# Patient Record
Sex: Male | Born: 1955 | Race: White | Hispanic: No | Marital: Married | Smoking: Former smoker
Health system: Southern US, Community
[De-identification: ages and names within clinical notes are randomized; demographics above are authoritative.]

## PROBLEM LIST (undated history)

## (undated) DIAGNOSIS — D689 Coagulation defect, unspecified: Secondary | ICD-10-CM

## (undated) DIAGNOSIS — R0600 Dyspnea, unspecified: Secondary | ICD-10-CM

## (undated) DIAGNOSIS — F419 Anxiety disorder, unspecified: Secondary | ICD-10-CM

## (undated) DIAGNOSIS — K635 Polyp of colon: Secondary | ICD-10-CM

## (undated) DIAGNOSIS — G4730 Sleep apnea, unspecified: Secondary | ICD-10-CM

## (undated) DIAGNOSIS — E785 Hyperlipidemia, unspecified: Secondary | ICD-10-CM

## (undated) DIAGNOSIS — G4733 Obstructive sleep apnea (adult) (pediatric): Secondary | ICD-10-CM

## (undated) DIAGNOSIS — D649 Anemia, unspecified: Secondary | ICD-10-CM

## (undated) DIAGNOSIS — M1990 Unspecified osteoarthritis, unspecified site: Secondary | ICD-10-CM

## (undated) DIAGNOSIS — J301 Allergic rhinitis due to pollen: Secondary | ICD-10-CM

## (undated) DIAGNOSIS — Z8489 Family history of other specified conditions: Secondary | ICD-10-CM

## (undated) DIAGNOSIS — J189 Pneumonia, unspecified organism: Secondary | ICD-10-CM

## (undated) DIAGNOSIS — I82409 Acute embolism and thrombosis of unspecified deep veins of unspecified lower extremity: Secondary | ICD-10-CM

## (undated) DIAGNOSIS — F324 Major depressive disorder, single episode, in partial remission: Secondary | ICD-10-CM

## (undated) DIAGNOSIS — K219 Gastro-esophageal reflux disease without esophagitis: Secondary | ICD-10-CM

## (undated) DIAGNOSIS — M5412 Radiculopathy, cervical region: Secondary | ICD-10-CM

## (undated) HISTORY — DX: Acute embolism and thrombosis of unspecified deep veins of unspecified lower extremity: I82.409

## (undated) HISTORY — DX: Polyp of colon: K63.5

## (undated) HISTORY — DX: Obstructive sleep apnea (adult) (pediatric): G47.33

## (undated) HISTORY — PX: KNEE SURGERY: SHX244

## (undated) HISTORY — PX: ORCHIECTOMY: SHX2116

## (undated) HISTORY — DX: Allergic rhinitis due to pollen: J30.1

## (undated) HISTORY — PX: EYE SURGERY: SHX253

## (undated) HISTORY — DX: Anxiety disorder, unspecified: F41.9

## (undated) HISTORY — DX: Major depressive disorder, single episode, in partial remission: F32.4

## (undated) HISTORY — PX: POLYPECTOMY: SHX149

## (undated) HISTORY — PX: COLONOSCOPY: SHX174

## (undated) HISTORY — DX: Pneumonia, unspecified organism: J18.9

## (undated) HISTORY — PX: KNEE ARTHROSCOPY: SUR90

## (undated) HISTORY — PX: CYSTECTOMY: SUR359

## (undated) HISTORY — DX: Coagulation defect, unspecified: D68.9

## (undated) HISTORY — PX: CARPAL TUNNEL RELEASE: SHX101

## (undated) HISTORY — DX: Hyperlipidemia, unspecified: E78.5

---

## 1998-10-03 DIAGNOSIS — I82409 Acute embolism and thrombosis of unspecified deep veins of unspecified lower extremity: Secondary | ICD-10-CM

## 1998-10-03 DIAGNOSIS — I824Z1 Acute embolism and thrombosis of unspecified deep veins of right distal lower extremity: Secondary | ICD-10-CM

## 1998-10-03 HISTORY — DX: Acute embolism and thrombosis of unspecified deep veins of right distal lower extremity: I82.4Z1

## 1998-10-03 HISTORY — DX: Acute embolism and thrombosis of unspecified deep veins of unspecified lower extremity: I82.409

## 2000-01-13 ENCOUNTER — Encounter: Payer: Self-pay | Admitting: Orthopedic Surgery

## 2000-01-13 ENCOUNTER — Encounter: Admission: RE | Admit: 2000-01-13 | Discharge: 2000-01-13 | Payer: Self-pay | Admitting: Orthopedic Surgery

## 2000-02-01 ENCOUNTER — Inpatient Hospital Stay (HOSPITAL_COMMUNITY): Admission: RE | Admit: 2000-02-01 | Discharge: 2000-02-03 | Payer: Self-pay | Admitting: Orthopedic Surgery

## 2000-03-10 ENCOUNTER — Ambulatory Visit (HOSPITAL_COMMUNITY): Admission: RE | Admit: 2000-03-10 | Discharge: 2000-03-10 | Payer: Self-pay | Admitting: Orthopedic Surgery

## 2000-10-20 ENCOUNTER — Encounter: Payer: Self-pay | Admitting: Orthopaedic Surgery

## 2000-10-20 ENCOUNTER — Encounter: Admission: RE | Admit: 2000-10-20 | Discharge: 2000-10-20 | Payer: Self-pay | Admitting: Orthopaedic Surgery

## 2003-10-04 HISTORY — PX: EYE SURGERY: SHX253

## 2004-02-06 ENCOUNTER — Ambulatory Visit (HOSPITAL_BASED_OUTPATIENT_CLINIC_OR_DEPARTMENT_OTHER): Admission: RE | Admit: 2004-02-06 | Discharge: 2004-02-06 | Payer: Self-pay | Admitting: Orthopaedic Surgery

## 2004-02-06 ENCOUNTER — Ambulatory Visit (HOSPITAL_COMMUNITY): Admission: RE | Admit: 2004-02-06 | Discharge: 2004-02-06 | Payer: Self-pay | Admitting: Orthopaedic Surgery

## 2005-05-19 ENCOUNTER — Ambulatory Visit: Payer: Self-pay | Admitting: Family Medicine

## 2005-10-13 ENCOUNTER — Ambulatory Visit: Payer: Self-pay | Admitting: Family Medicine

## 2005-10-26 ENCOUNTER — Ambulatory Visit: Payer: Self-pay | Admitting: Family Medicine

## 2005-11-09 ENCOUNTER — Ambulatory Visit: Payer: Self-pay | Admitting: Family Medicine

## 2005-11-09 LAB — CONVERTED CEMR LAB: PSA: 0.72 ng/mL

## 2005-11-16 ENCOUNTER — Ambulatory Visit: Payer: Self-pay | Admitting: Family Medicine

## 2005-11-21 ENCOUNTER — Ambulatory Visit: Payer: Self-pay | Admitting: Family Medicine

## 2005-11-23 ENCOUNTER — Ambulatory Visit: Payer: Self-pay | Admitting: Family Medicine

## 2005-12-06 ENCOUNTER — Ambulatory Visit: Payer: Self-pay | Admitting: Family Medicine

## 2006-03-28 ENCOUNTER — Ambulatory Visit: Payer: Self-pay | Admitting: Internal Medicine

## 2006-04-27 ENCOUNTER — Ambulatory Visit: Payer: Self-pay | Admitting: Family Medicine

## 2006-06-07 ENCOUNTER — Ambulatory Visit: Payer: Self-pay | Admitting: Family Medicine

## 2007-03-15 ENCOUNTER — Encounter: Payer: Self-pay | Admitting: Family Medicine

## 2007-03-15 DIAGNOSIS — J309 Allergic rhinitis, unspecified: Secondary | ICD-10-CM | POA: Insufficient documentation

## 2007-03-15 DIAGNOSIS — IMO0002 Reserved for concepts with insufficient information to code with codable children: Secondary | ICD-10-CM | POA: Insufficient documentation

## 2007-03-15 DIAGNOSIS — R7309 Other abnormal glucose: Secondary | ICD-10-CM | POA: Insufficient documentation

## 2007-03-15 DIAGNOSIS — F528 Other sexual dysfunction not due to a substance or known physiological condition: Secondary | ICD-10-CM | POA: Insufficient documentation

## 2007-03-15 DIAGNOSIS — E785 Hyperlipidemia, unspecified: Secondary | ICD-10-CM | POA: Insufficient documentation

## 2007-03-16 ENCOUNTER — Ambulatory Visit: Payer: Self-pay | Admitting: Family Medicine

## 2007-03-16 DIAGNOSIS — R1310 Dysphagia, unspecified: Secondary | ICD-10-CM | POA: Insufficient documentation

## 2007-03-16 DIAGNOSIS — F324 Major depressive disorder, single episode, in partial remission: Secondary | ICD-10-CM | POA: Insufficient documentation

## 2007-03-16 DIAGNOSIS — IMO0002 Reserved for concepts with insufficient information to code with codable children: Secondary | ICD-10-CM | POA: Insufficient documentation

## 2007-03-16 HISTORY — DX: Major depressive disorder, single episode, in partial remission: F32.4

## 2007-04-03 ENCOUNTER — Telehealth (INDEPENDENT_AMBULATORY_CARE_PROVIDER_SITE_OTHER): Payer: Self-pay | Admitting: *Deleted

## 2007-04-23 ENCOUNTER — Telehealth (INDEPENDENT_AMBULATORY_CARE_PROVIDER_SITE_OTHER): Payer: Self-pay | Admitting: *Deleted

## 2007-04-24 ENCOUNTER — Ambulatory Visit: Payer: Self-pay | Admitting: Family Medicine

## 2007-04-24 LAB — CONVERTED CEMR LAB
ALT: 24 units/L (ref 0–53)
AST: 35 units/L (ref 0–37)
BUN: 16 mg/dL (ref 6–23)
Chloride: 102 meq/L (ref 96–112)
Creatinine,U: 55.2 mg/dL
Folate: 14 ng/mL
GFR calc Af Amer: 92 mL/min
GFR calc non Af Amer: 76 mL/min
LDL Cholesterol: 146 mg/dL — ABNORMAL HIGH (ref 0–99)
Total CHOL/HDL Ratio: 5.4
VLDL: 15 mg/dL (ref 0–40)
Vitamin B-12: 274 pg/mL (ref 211–911)

## 2007-05-01 ENCOUNTER — Ambulatory Visit: Payer: Self-pay | Admitting: Family Medicine

## 2007-05-17 ENCOUNTER — Telehealth (INDEPENDENT_AMBULATORY_CARE_PROVIDER_SITE_OTHER): Payer: Self-pay | Admitting: *Deleted

## 2007-06-12 ENCOUNTER — Ambulatory Visit: Payer: Self-pay | Admitting: Family Medicine

## 2007-06-13 LAB — CONVERTED CEMR LAB
AST: 27 units/L (ref 0–37)
Direct LDL: 101.5 mg/dL

## 2007-06-25 ENCOUNTER — Telehealth (INDEPENDENT_AMBULATORY_CARE_PROVIDER_SITE_OTHER): Payer: Self-pay | Admitting: *Deleted

## 2007-08-03 ENCOUNTER — Ambulatory Visit: Payer: Self-pay | Admitting: Family Medicine

## 2007-08-03 LAB — CONVERTED CEMR LAB
AST: 34 units/L (ref 0–37)
Cholesterol: 172 mg/dL (ref 0–200)
LDL Cholesterol: 108 mg/dL — ABNORMAL HIGH (ref 0–99)
Total CHOL/HDL Ratio: 4
Triglycerides: 105 mg/dL (ref 0–149)
VLDL: 21 mg/dL (ref 0–40)

## 2007-08-06 ENCOUNTER — Ambulatory Visit: Payer: Self-pay | Admitting: Family Medicine

## 2008-02-28 ENCOUNTER — Ambulatory Visit: Payer: Self-pay | Admitting: Family Medicine

## 2008-02-28 LAB — CONVERTED CEMR LAB
ALT: 28 units/L (ref 0–53)
AST: 28 units/L (ref 0–37)
Albumin: 3.6 g/dL (ref 3.5–5.2)
Bilirubin, Direct: 0.1 mg/dL (ref 0.0–0.3)
CO2: 28 meq/L (ref 19–32)
Calcium: 9.3 mg/dL (ref 8.4–10.5)
Chloride: 107 meq/L (ref 96–112)
Cholesterol: 136 mg/dL (ref 0–200)
GFR calc Af Amer: 102 mL/min
GFR calc non Af Amer: 84 mL/min
Glucose, Bld: 99 mg/dL (ref 70–99)
Microalb Creat Ratio: 1.7 mg/g (ref 0.0–30.0)
Microalb, Ur: 0.2 mg/dL (ref 0.0–1.9)
Potassium: 4.4 meq/L (ref 3.5–5.1)
Total Protein: 6.2 g/dL (ref 6.0–8.3)
Triglycerides: 67 mg/dL (ref 0–149)

## 2008-03-04 ENCOUNTER — Ambulatory Visit: Payer: Self-pay | Admitting: Family Medicine

## 2008-03-04 DIAGNOSIS — E663 Overweight: Secondary | ICD-10-CM | POA: Insufficient documentation

## 2008-03-26 ENCOUNTER — Telehealth (INDEPENDENT_AMBULATORY_CARE_PROVIDER_SITE_OTHER): Payer: Self-pay | Admitting: Internal Medicine

## 2008-03-26 ENCOUNTER — Encounter (INDEPENDENT_AMBULATORY_CARE_PROVIDER_SITE_OTHER): Payer: Self-pay | Admitting: *Deleted

## 2008-05-22 ENCOUNTER — Ambulatory Visit: Payer: Self-pay | Admitting: Family Medicine

## 2008-05-22 DIAGNOSIS — M545 Low back pain, unspecified: Secondary | ICD-10-CM | POA: Insufficient documentation

## 2008-06-02 ENCOUNTER — Encounter: Payer: Self-pay | Admitting: Family Medicine

## 2008-07-07 ENCOUNTER — Ambulatory Visit: Payer: Self-pay | Admitting: Family Medicine

## 2008-07-07 LAB — CONVERTED CEMR LAB
Blood in Urine, dipstick: NEGATIVE
Glucose, Urine, Semiquant: NEGATIVE
Nitrite: NEGATIVE
Protein, U semiquant: NEGATIVE
Specific Gravity, Urine: <1.005
Urobilinogen, UA: 0.2

## 2008-07-08 LAB — CONVERTED CEMR LAB
ALT: 28 units/L (ref 0–53)
AST: 27 units/L (ref 0–37)
Alkaline Phosphatase: 54 units/L (ref 39–117)
BUN: 14 mg/dL (ref 6–23)
Bilirubin, Direct: 0.1 mg/dL (ref 0.0–0.3)
CO2: 29 meq/L (ref 19–32)
Calcium: 9.2 mg/dL (ref 8.4–10.5)
Creatinine, Ser: 1.1 mg/dL (ref 0.4–1.5)
GFR calc Af Amer: 92 mL/min
GFR calc non Af Amer: 76 mL/min
MCV: 96.6 fL (ref 78.0–100.0)
Monocytes Absolute: 0.6 10*3/uL (ref 0.1–1.0)
Neutrophils Relative %: 69.9 % (ref 43.0–77.0)
RDW: 12.7 % (ref 11.5–14.6)
Sodium: 141 meq/L (ref 135–145)
Total Bilirubin: 0.8 mg/dL (ref 0.3–1.2)
Total Protein: 7.3 g/dL (ref 6.0–8.3)

## 2008-07-10 ENCOUNTER — Ambulatory Visit: Payer: Self-pay | Admitting: Cardiology

## 2008-07-28 ENCOUNTER — Encounter: Admission: RE | Admit: 2008-07-28 | Discharge: 2008-07-28 | Payer: Self-pay | Admitting: Orthopaedic Surgery

## 2008-07-30 ENCOUNTER — Encounter: Admission: RE | Admit: 2008-07-30 | Discharge: 2008-07-30 | Payer: Self-pay | Admitting: Orthopedic Surgery

## 2009-05-06 ENCOUNTER — Ambulatory Visit: Payer: Self-pay | Admitting: Family Medicine

## 2009-05-06 LAB — CONVERTED CEMR LAB
Albumin: 3.9 g/dL (ref 3.5–5.2)
Alkaline Phosphatase: 51 units/L (ref 39–117)
Basophils Absolute: 0 10*3/uL (ref 0.0–0.1)
Bilirubin, Direct: 0.1 mg/dL (ref 0.0–0.3)
CO2: 29 meq/L (ref 19–32)
Chloride: 108 meq/L (ref 96–112)
Cholesterol: 137 mg/dL (ref 0–200)
GFR calc non Af Amer: 75.07 mL/min (ref >60)
Hemoglobin: 15.6 g/dL (ref 13.0–17.0)
LDL Cholesterol: 78 mg/dL (ref 0–99)
Lymphs Abs: 1.4 10*3/uL (ref 0.7–4.0)
Microalb Creat Ratio: 1.4 mg/g (ref 0.0–30.0)
Monocytes Absolute: 0.7 10*3/uL (ref 0.1–1.0)
Monocytes Relative: 10.6 % (ref 3.0–12.0)
Potassium: 4.4 meq/L (ref 3.5–5.1)
RDW: 12.5 % (ref 11.5–14.6)
Sodium: 142 meq/L (ref 135–145)
Total CHOL/HDL Ratio: 4
Triglycerides: 103 mg/dL (ref 0.0–149.0)
WBC: 6.2 10*3/uL (ref 4.5–10.5)

## 2009-05-13 ENCOUNTER — Ambulatory Visit: Payer: Self-pay | Admitting: Family Medicine

## 2009-05-27 ENCOUNTER — Encounter (INDEPENDENT_AMBULATORY_CARE_PROVIDER_SITE_OTHER): Payer: Self-pay | Admitting: *Deleted

## 2009-05-27 ENCOUNTER — Ambulatory Visit: Payer: Self-pay | Admitting: Family Medicine

## 2009-05-27 LAB — CONVERTED CEMR LAB
OCCULT 2: NEGATIVE
OCCULT 3: NEGATIVE

## 2009-06-22 ENCOUNTER — Telehealth: Payer: Self-pay | Admitting: Family Medicine

## 2010-01-05 ENCOUNTER — Encounter: Payer: Self-pay | Admitting: Family Medicine

## 2010-02-06 ENCOUNTER — Encounter: Admission: RE | Admit: 2010-02-06 | Discharge: 2010-02-06 | Payer: Self-pay | Admitting: Orthopaedic Surgery

## 2010-03-18 IMAGING — CT CT PELVIS W/ CM
2 of 5 series · 17 of 46 positions shown, 19 images · IV contrast (agent unspecified)
Comparison: None

CT ABDOMEN

CLINICAL DATA: Abdominal tenderness, particularly in the left
lower quadrant

CT ABDOMEN AND PELVIS WITH CONTRAST
TECHNIQUE: Multidetector CT imaging of the abdomen and pelvis was
performed using the standard protocol following bolus
administration of intravenous contrast.
Contrast: 125 ml Lmnipaque-GCC

[Series 2: abd_pel_xxl 5.0 b10f st · axial · 0.98mm/px · z∈[-510,-60]mm · 14 of 100 slices shown, 16 images]
[im 5/100  soft-tissue]
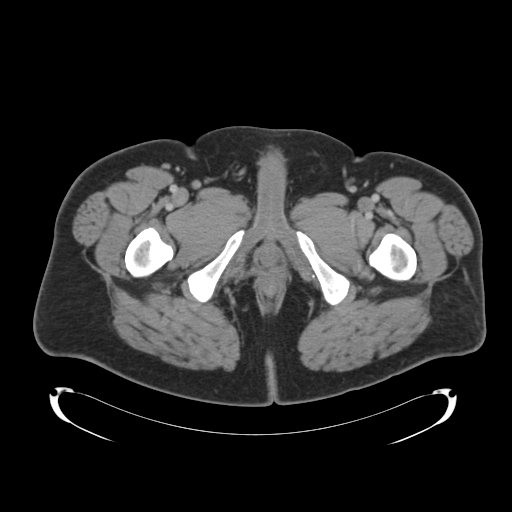
[im 5/100  bone]
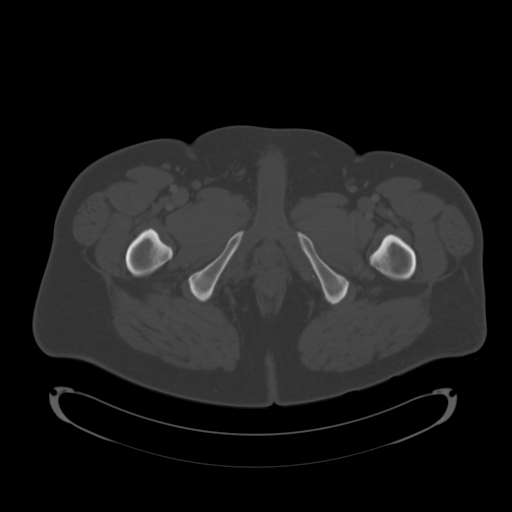
[im 15/100  soft-tissue]
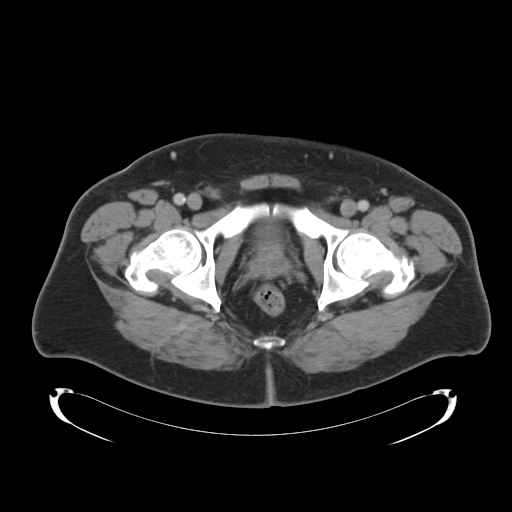
[im 20/100  soft-tissue]
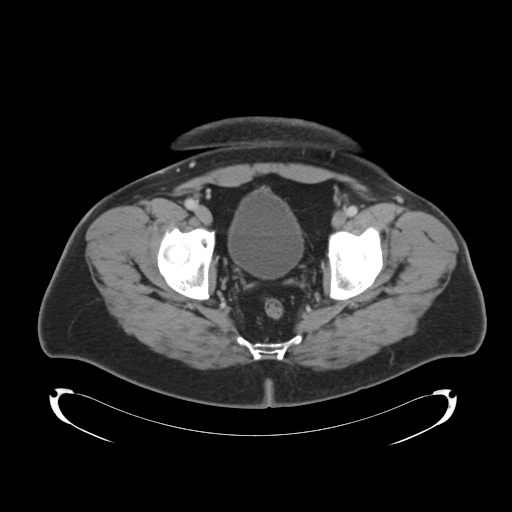
[im 25/100  soft-tissue]
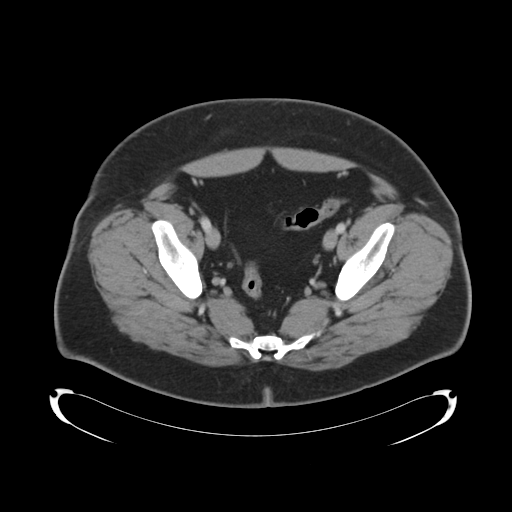
[im 35/100  soft-tissue]
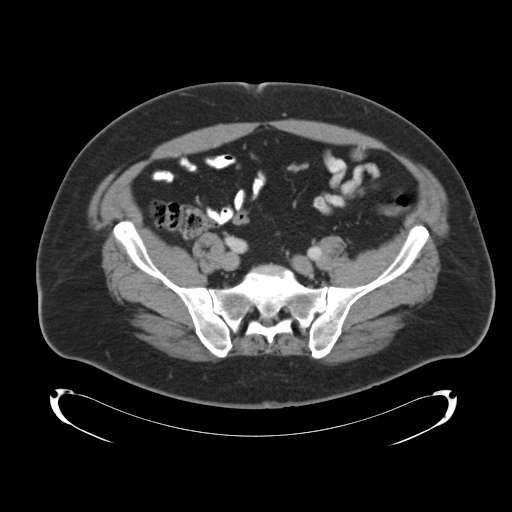
[im 40/100  soft-tissue]
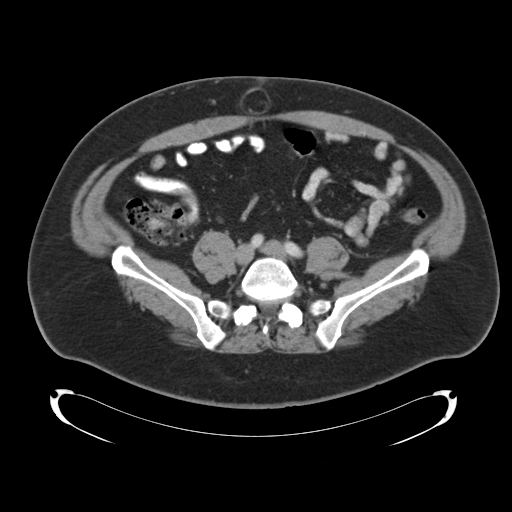
[im 45/100  soft-tissue]
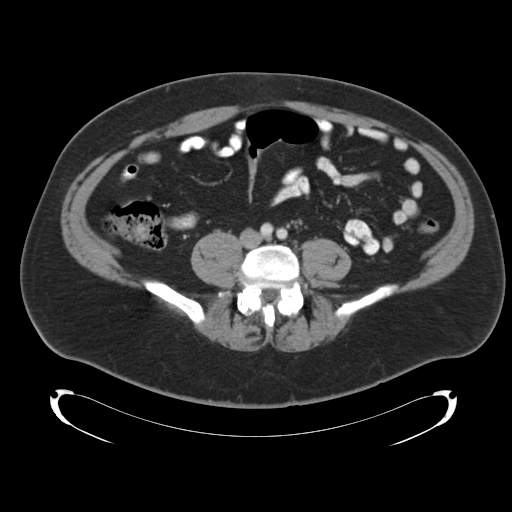
[im 55/100  soft-tissue]
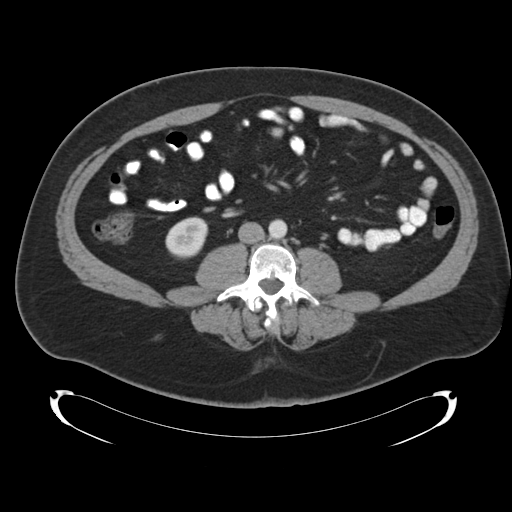
[im 60/100  soft-tissue]
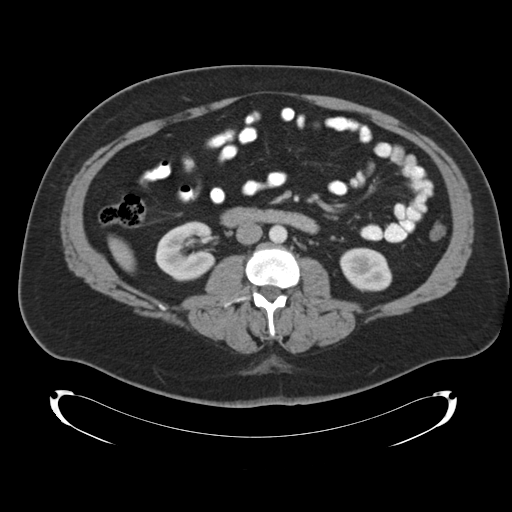
[im 60/100  bone]
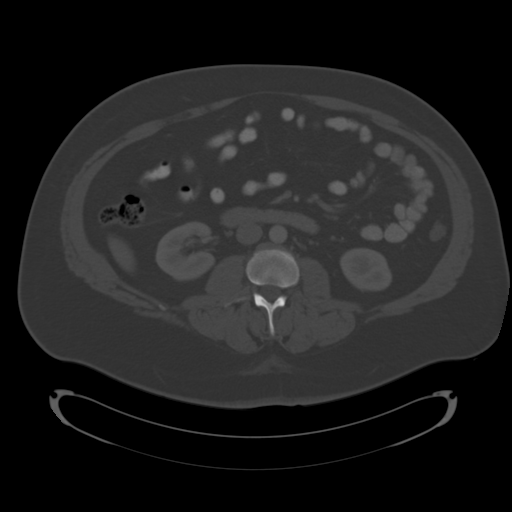
[im 65/100  soft-tissue]
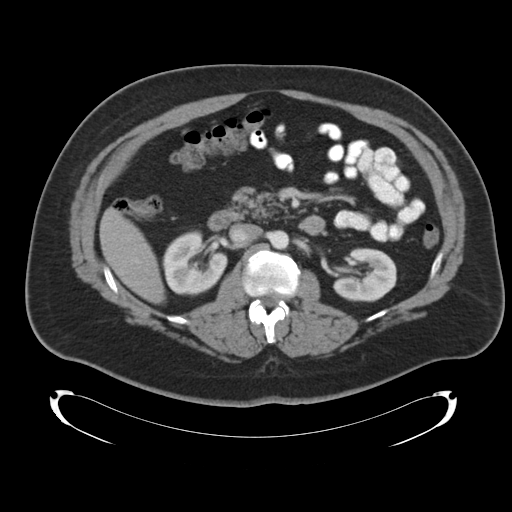
[im 75/100  soft-tissue]
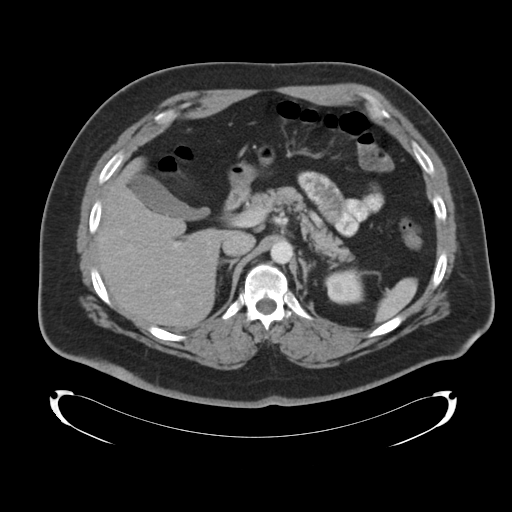
[im 80/100  soft-tissue]
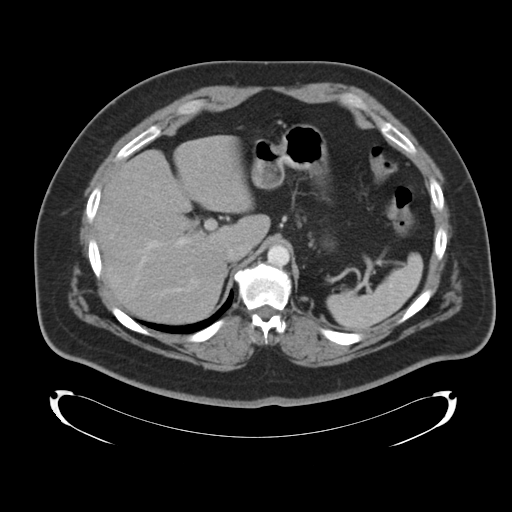
[im 85/100  soft-tissue]
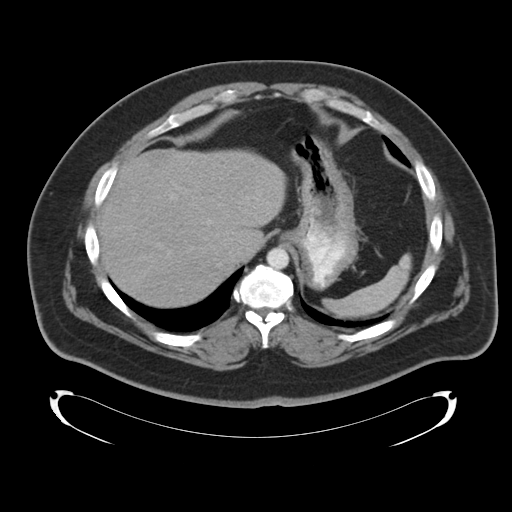
[im 95/100  soft-tissue]
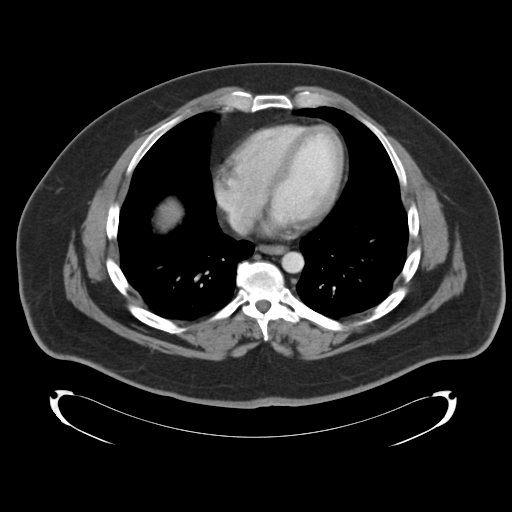

[Series 602: <mpr thick range> · coronal · 0.99mm/px · 3 of 113 slices shown]
[im 38/113  soft-tissue]
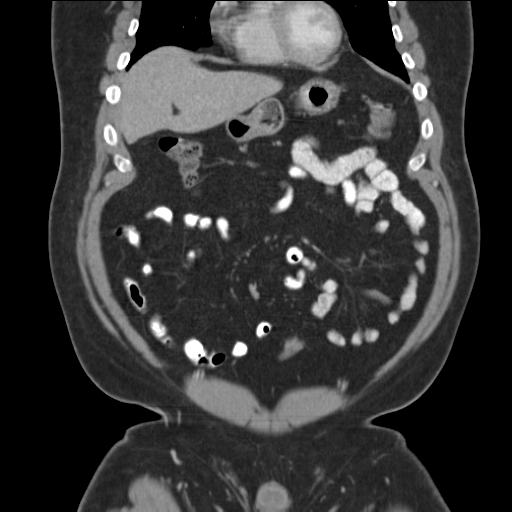
[im 50/113  soft-tissue]
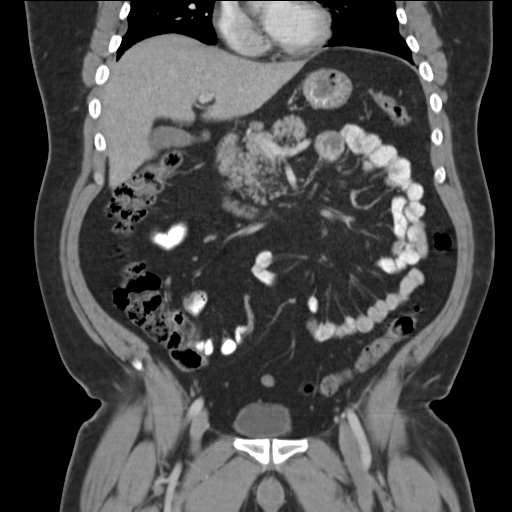
[im 63/113  soft-tissue]
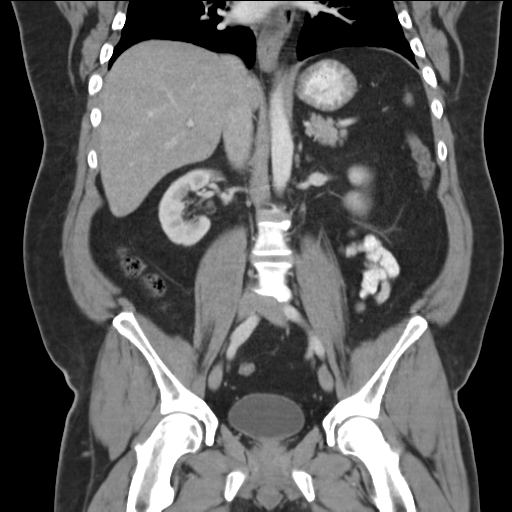

[17 of 46 positions shown; findings below may reference images not displayed]

FINDINGS: The lung bases are clear.  The liver enhances with no
focal abnormality and no ductal dilatation is seen.  No calcified
gallstones are noted.  The pancreas is normal in size and the
pancreatic duct is not dilated.  The adrenal glands and spleen
appear normal.  The kidneys enhance with no evidence of renal
calculi or hydronephrosis.  The abdominal aorta is normal in
caliber.
IMPRESSION: No significant abnormality on CT of the abdomen.

CT PELVIS
FINDINGS: The urinary bladder is unremarkable.  The prostate is
normal in size.  No colonic diverticula are seen and there is no CT
evidence currently of diverticulitis.  The appendix and terminal
ileum appear normal.  Small umbilical hernia containing only fat is
noted.  No fluid is seen within the pelvis.  No bony abnormality is
seen.
IMPRESSION: No acute process.  The appendix and terminal ileum appear normal.
No evidence of diverticulitis is seen.

## 2010-05-11 ENCOUNTER — Encounter (INDEPENDENT_AMBULATORY_CARE_PROVIDER_SITE_OTHER): Payer: Self-pay | Admitting: *Deleted

## 2010-05-24 ENCOUNTER — Encounter: Payer: Self-pay | Admitting: Gastroenterology

## 2010-05-24 ENCOUNTER — Ambulatory Visit: Payer: Self-pay | Admitting: Family Medicine

## 2010-05-24 DIAGNOSIS — R5383 Other fatigue: Secondary | ICD-10-CM | POA: Insufficient documentation

## 2010-05-24 DIAGNOSIS — R5381 Other malaise: Secondary | ICD-10-CM | POA: Insufficient documentation

## 2010-05-25 LAB — CONVERTED CEMR LAB
Albumin: 3.8 g/dL (ref 3.5–5.2)
Alkaline Phosphatase: 45 units/L (ref 39–117)
Basophils Absolute: 0 10*3/uL (ref 0.0–0.1)
CO2: 28 meq/L (ref 19–32)
Calcium: 9 mg/dL (ref 8.4–10.5)
Cholesterol: 135 mg/dL (ref 0–200)
Creatinine, Ser: 1 mg/dL (ref 0.4–1.5)
Eosinophils Absolute: 0.1 10*3/uL (ref 0.0–0.7)
Glucose, Bld: 93 mg/dL (ref 70–99)
HDL: 37 mg/dL — ABNORMAL LOW (ref >39.00)
Hemoglobin: 15.2 g/dL (ref 13.0–17.0)
Lymphocytes Relative: 25.4 % (ref 12.0–46.0)
Lymphs Abs: 1.4 10*3/uL (ref 0.7–4.0)
MCHC: 34.3 g/dL (ref 30.0–36.0)
Neutro Abs: 3.3 10*3/uL (ref 1.4–7.7)
PSA: 0.48 ng/mL (ref 0.10–4.00)
RDW: 13.5 % (ref 11.5–14.6)
Sodium: 141 meq/L (ref 135–145)
Triglycerides: 57 mg/dL (ref 0.0–149.0)

## 2010-07-07 ENCOUNTER — Encounter (INDEPENDENT_AMBULATORY_CARE_PROVIDER_SITE_OTHER): Payer: Self-pay | Admitting: *Deleted

## 2010-07-09 ENCOUNTER — Ambulatory Visit: Payer: Self-pay | Admitting: Gastroenterology

## 2010-07-23 ENCOUNTER — Ambulatory Visit: Payer: Self-pay | Admitting: Gastroenterology

## 2010-07-27 ENCOUNTER — Encounter: Payer: Self-pay | Admitting: Gastroenterology

## 2010-10-24 ENCOUNTER — Encounter: Payer: Self-pay | Admitting: Orthopaedic Surgery

## 2010-11-02 NOTE — Letter (Signed)
Summary: Previsit letter  [facility]  [address] [place], [place] [address]   Phone: [phone]  Fax: [phone]       05/24/2010 MRN: [id number]  Bryan Kirby [address] [place], [place]  [address]  Dear Mr. Lwin,  Welcome to the Gastroenterology Division at [facility].    You are scheduled to see a nurse for your pre-procedure visit on 07-09-10 at 8am on the 3rd floor at [facility], [address]  We ask that you try to arrive at our office 15 minutes prior to your appointment time to allow for check-in.  Your nurse visit will consist of discussing your medical and surgical history, your immediate family medical history, and your medications.    Please bring a complete list of all your medications or, if you prefer, bring the medication bottles and we will list them.  We will need to be aware of both prescribed and over the counter drugs.  We will need to know exact dosage information as well.  If you are on blood thinners (Coumadin, Plavix, Aggrenox, Ticlid, etc.) please call our office today/prior to your appointment, as we need to consult with your physician about holding your medication.   Please be prepared to read and sign documents such as consent forms, a financial agreement, and acknowledgement forms.  If necessary, and with your consent, a friend or relative is welcome to sit-in on the nurse visit with you.  Please bring your insurance card so that we may make a copy of it.  If your insurance requires a referral to see a specialist, please bring your referral form from your primary care physician.  No co-pay is required for this nurse visit.     If you cannot keep your appointment, please call [phone] to cancel or reschedule prior to your appointment date.  This allows [place] the opportunity to schedule an appointment for another patient in need of care.    Thank you for choosing [facility] for your medical needs.   We appreciate the opportunity to care for you.  Please visit [place] at our website  to learn more about our practice.                     Sincerely.                                                                                                                   The Gastroenterology Division

## 2010-11-02 NOTE — Letter (Signed)
Summary: Schaller Retirement letter  [facility] [place]  [address]   [place], [place] [address]   Phone: [phone]  Fax: [phone]       05/11/2010 MRN: [id number]  Teondre Burnette [address] [place], [place]  [address]  Dear Mr. Mccardle,  [facility] [place], and [facility] announce the retirement of ROBERT N. SCHALLER, M.D., from full-time practice at the [place] office effective April 01, 2010 and his plans of returning part-time.  It is important to Dr. Schaller and to our practice that you understand that [facility] [place] has seven physicians in our office for your health care needs.  We will continue to offer the same exceptional care that you have today.    Dr. Schaller has spoken to many of you about his plans for retirement and returning part-time in the fall.   We will continue to work with you through the transition to schedule appointments for you in the office and meet the high standards that [facility] [place] is committed to.   Again, it is with great pleasure that we share the news that Dr. Schaller will return to [facility] [place] in October of 2011 with a reduced schedule.    If you have any questions, or would like to request an appointment with one of our physicians, please call us at [phone] and press the option for Scheduling an appointment.  We take pleasure in providing you with excellent patient care and look forward to seeing you at your next office visit.  Our [place] Physicians are:  Richard Letvak, M.D. Robert Schaller, M.D. Marne Tower, M.D. Amy Bedsole, M.D. Spencer Copland, M.D. Talia Aron, M.D. We proudly welcomed Shaw Duncan, M.D. and Javier Gutierrez, M.D. to the practice in July/August 2011.  Sincerely,  [facility] [place] 

## 2010-11-02 NOTE — Letter (Signed)
Summary: [facility] Instructions  [facility]  [address] [place], [place] [address]   Phone: [phone]  Fax: [phone]       Bryan Kirby    [birth year]    MRN: [id number]        Procedure Day /Date: Friday 07-23-10     Arrival Time: 8:00 a.m.     Procedure Time: 9:00 a.m.     Location of Procedure:                     [facility] (4th Floor)   PREPARATION FOR COLONOSCOPY WITH MOVIPREP   Starting 5 days prior to your procedure  07-18-10 do not eat nuts, seeds, popcorn, corn, beans, peas,  salads, or any raw vegetables.  Do not take any fiber supplements (e.g. Metamucil, Citrucel, and Benefiber).  THE DAY BEFORE YOUR PROCEDURE         DATE:  07-22-10  DAY:  Thursday  1.  Drink clear liquids the entire day-NO SOLID FOOD  2.  Do not drink anything colored red or purple.  Avoid juices with pulp.  No orange juice.  3.  Drink at least 64 oz. (8 glasses) of fluid/clear liquids during the day to prevent dehydration and help the prep work efficiently.  CLEAR LIQUIDS INCLUDE: Water Jello Ice Popsicles Tea (sugar ok, no milk/cream) Powdered fruit flavored drinks Coffee (sugar ok, no milk/cream) Gatorade Juice: apple, white grape, white cranberry  Lemonade Clear bullion, consomm, broth Carbonated beverages (any kind) Strained chicken noodle soup Hard Candy                             4.  In the morning, mix first dose of MoviPrep solution:    Empty 1 Pouch A and 1 Pouch B into the disposable container    Add lukewarm drinking water to the top line of the container. Mix to dissolve    Refrigerate (mixed solution should be used within 24 hrs)  5.  Begin drinking the prep at 5:00 p.m. The MoviPrep container is divided by 4 marks.   Every 15 minutes drink the solution down to the next mark (approximately 8 oz) until the full liter is complete.   6.  Follow completed prep with 16 oz of clear liquid of your choice (Nothing red or purple).   Continue to drink clear liquids until bedtime.  7.  Before going to bed, mix second dose of MoviPrep solution:    Empty 1 Pouch A and 1 Pouch B into the disposable container    Add lukewarm drinking water to the top line of the container. Mix to dissolve    Refrigerate  THE DAY OF YOUR PROCEDURE      DATE:  07-23-10  DAY:  Friday  Beginning at  4:00 a.m. (5 hours before procedure):         1. Every 15 minutes, drink the solution down to the next mark (approx 8 oz) until the full liter is complete.  2. Follow completed prep with 16 oz. of clear liquid of your choice.    3. You may drink clear liquids until  7:00 a.m. (2 HOURS BEFORE PROCEDURE).   MEDICATION INSTRUCTIONS  Unless otherwise instructed, you should take regular prescription medications with a small sip of water   as early as possible the morning of your procedure.          OTHER INSTRUCTIONS  You  will need a responsible adult at least 53 years of age to accompany you and drive you home.   This person must remain in the waiting room during your procedure.  Wear loose fitting clothing that is easily removed.  Leave jewelry and other valuables at home.  However, you may wish to bring a book to read or  an iPod/MP3 player to listen to music as you wait for your procedure to start.  Remove all body piercing jewelry and leave at home.  Total time from sign-in until discharge is approximately 2-3 hours.  You should go home directly after your procedure and rest.  You can resume normal activities the  day after your procedure.  The day of your procedure you should not:   Drive   Make legal decisions   Operate machinery   Drink alcohol   Return to work  You will receive specific instructions about eating, activities and medications before you leave.    The above instructions have been reviewed and explained to me by   Ezra Sites RN  July 09, 2010 8:18 AM    I fully understand and can verbalize  these instructions _____________________________ Date _________

## 2010-11-02 NOTE — Assessment & Plan Note (Signed)
Summary: cpx   Vital Signs:  Patient profile:   55 year old male Height:      71 inches Weight:      281.2 pounds BMI:     39.36 Temp:     98.6 degrees F oral Pulse rate:   80 / minute Pulse rhythm:   regular BP sitting:   106 / 78  (left arm) Cuff size:   large  Vitals Entered By: Benny Lennert CMA Duncan Dull) (May 24, 2010 8:25 AM)  History of Present Illness: Chief complaint cpx  Colonoscopy:patient has never had a colonoscopy, and he is willing to undergo  Slipped on some wet cement. Ruptured patellar tendon.  Has been walking a lot more gingerly.   Sleep apnea: was told that may have sleep apnea by anesthesia.  this was done at the time of his patellar injury. He has never had a formal sleep study, he is not interested in having one, or having CPAP or any interventions done at this time.  Preventive Screening-Counseling & Management  Alcohol-Tobacco     Alcohol drinks/day: 0     Smoking Status: never     Passive Smoke Exposure: no  Caffeine-Diet-Exercise     Caffeine use/day: 3     Diet Counseling: to improve diet; diet is suboptimal     Does Patient Exercise: no     Exercise Counseling: to improve exercise regimen  Hep-HIV-STD-Contraception     HIV Risk: no     STD Risk: no risk noted     Dental Care Counseling: not indicated; dental care within six months     Testicular SE Education/Counseling to perform regular STE  Safety-Violence-Falls     Seat Belt Use: 100      Sexual History:  currently monogamous.        Drug Use:  no.    Clinical Review Panels:  Prevention   Last PSA:  0.51 (05/06/2009)  Lipid Management   Cholesterol:  137 (05/06/2009)   LDL (bad choesterol):  78 (05/06/2009)   HDL (good cholesterol):  38.20 (05/06/2009)  CBC   WBC:  6.2 (05/06/2009)   RBC:  4.86 (05/06/2009)   Hgb:  15.6 (05/06/2009)   Hct:  45.3 (05/06/2009)   Platelets:  200.0 (05/06/2009)   MCV  93.3 (05/06/2009)   MCHC  34.4 (05/06/2009)   RDW  12.5  (05/06/2009)   PMN:  64.6 (05/06/2009)   Lymphs:  22.7 (05/06/2009)   Monos:  10.6 (05/06/2009)   Eosinophils:  1.7 (05/06/2009)   Basophil:  0.4 (05/06/2009)  Complete Metabolic Panel   Glucose:  100 (05/06/2009)   Sodium:  142 (05/06/2009)   Potassium:  4.4 (05/06/2009)   Chloride:  108 (05/06/2009)   CO2:  29 (05/06/2009)   BUN:  16 (05/06/2009)   Creatinine:  1.1 (05/06/2009)   Albumin:  3.9 (05/06/2009)   Total Protein:  6.6 (05/06/2009)   Calcium:  9.0 (05/06/2009)   Total Bili:  0.6 (05/06/2009)   Alk Phos:  51 (05/06/2009)   SGPT (ALT):  20 (05/06/2009)   SGOT (AST):  29 (05/06/2009)   Allergies: 1)  ! Penicillin 2)  ! Codeine  Past History:  Past medical, surgical, family and social histories (including risk factors) reviewed, and no changes noted (except as noted below).  Past Medical History: Reviewed history from 03/15/2007 and no changes required. Allergic rhinitis:(01/2000) Hyperlipidemia:( 11/20/1997)  Past Surgical History: Reviewed history from 05/13/2009 and no changes required. 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 Father: A 34  PROSTATECTOMY DUE TO CANCER / MINI STROKE SKIN CA Scalp Mother: A 71 DEPRESSION, PHLEBITIS, FIBROMYALGIA SISTER  A 44  CERVICAL CANCER  OBESE CV:+ PGF HBP: +PGF DM: +PGF PROSTATE CANCER: PROSTATE ,BLADDER CANCER + FATHER 60YOA SMOKER BREAST/OVARIAN/UTERINE CANCER: +SISTER CERVICAL DEPRESSION: +MOTHER ETOH/DRUG ABUSE :NEGATIVE OTHER + STROKE PGF, +MINI STROKE FATHER, UNCLE  Social History: Reviewed history from 03/16/2007 and no changes required. Marital Status: Married DIVORCED 08/2003  REMARRIED 08/2006 Children: 1 SON  Occupation: SELF EMPLOYED PLUMBER STD Risk:  no risk noted Sexual History:  currently monogamous  Review of Systems  General: Denies fever, chills, sweats, anorexia, fatigue, weakness, malaise Eyes: Denies blurring, vision loss ENT: Denies earache, nasal congestion, nosebleeds, sore throat, and hoarseness.  Cardiovascular: Denies chest pains, palpitations, syncope, dyspnea on exertion,  Respiratory: Denies cough, dyspnea at rest, excessive sputum,wheeezing GI: Denies nausea, vomiting, diarrhea, constipation, change in bowel habits, abdominal pain, melena, hematochezia GU: Denies dysuria, hematuria, discharge, urinary frequency, urinary hesitancy, nocturia, incontinence, genital sores Musculoskeletal: Denies back pain, joint pain Derm: Denies rash, itching Neuro: Denies  paresthesias, frequent falls, frequent headaches, and difficulty walking.  Psych: Denies depression, anxiety Endocrine: Denies cold intolerance, heat intolerance, polydipsia, polyphagia, polyuria, and unusual weight change.  Heme: Denies enlarged lymph nodes Allergy: No hayfever   Otherwise, the pertinent positives and negatives are listed above and in the HPI, otherwise a full review of systems has been reviewed and is negative unless noted positive.    Impression & Recommendations:  Problem # 1:  HEALTH MAINTENANCE EXAM (ICD-V70.0) The patient's preventative maintenance and recommended screening tests for an annual wellness exam were reviewed in full today. Brought up to date unless services declined.  Counselled on the importance of diet, exercise, and its role in overall health and mortality. The patient's FH and SH was reviewed, including their home life, tobacco status, and drug and alcohol status.   Problem # 2:  SCREENING, COLON CANCER (ICD-V76.51) colonoscopy  Orders: Gastroenterology Referral (GI)  Complete Medication List: 1)  Prilosec 20 Mg Cpdr (Omeprazole) ....  One tablet daily by mouth 2)  Simvastatin 40 Mg Tabs (Simvastatin) .... One tab by mouth at night 3)  Multivitamins Tabs (Multiple vitamin) .... Take 1 tablet by mouth once a day 4)  Lexapro 10 Mg Tabs (Escitalopram oxalate) .... One tab by mouth once daily  Other Orders: Venipuncture ([address]) TLB-Lipid Panel (80061-LIPID) TLB-BMP (Basic Metabolic Panel-BMET) (80048-METABOL) TLB-CBC Platelet - w/Differential (85025-CBCD) TLB-Hepatic/Liver Function Pnl (80076-HEPATIC) TLB-PSA (Prostate Specific Antigen) (84153-PSA)  Patient Instructions: 1)  Referral Appointment Information 2)  Day/Date: 3)  Time: 4)  Place/MD: 5)  Address: 6)  Phone/Fax: 7)  Patient given appointment information. Information/Orders faxed/mailed.  Prescriptions: LEXAPRO 10 MG TABS (ESCITALOPRAM OXALATE) one tab by mouth once daily  #30 x 11   Entered by:   Benny Lennert CMA (AAMA)   Authorized by:   [name] MD   Signed by:   Benny Lennert CMA (AAMA) on 05/24/2010   Method used:   Electronically to        [facility]* (retail)       [address]       [place], [place]  [address]       Ph:  [phone]       Fax: [phone]   RxID:   [id number] SIMVASTATIN 40 MG  TABS (SIMVASTATIN) one tab by mouth at night  #30 x 11   Entered by:   Benny Lennert CMA (AAMA)   Authorized by:   [name] MD   Signed by:   Benny Lennert CMA (AAMA) on 05/24/2010   Method used:   Electronically to        [facility]* (retail)       [address]       [place], [place]  [address]       Ph: [phone]       Fax: [phone]   RxID:   [id number]   Current Allergies (reviewed today): ! PENICILLIN ! CODEINE    General Medical Physical Exam:  General Appearance:      Well-developed,well-nourished,in no acute distress; alert,appropriate and cooperative throughout examination  Head:      Inspection:     normocephalic without obvious abnormalities      Palpation:     no abnormal lesions  palpable  Eyes:      External:     conjunctiva and lids normal      Pupils:     equal, round, and reactive to light and accommodation      Fundus:     discs sharp and flat; no a/v nicking, hemorrhages, or exudates  Ears, Nose, Throat:      External:     Normocephalic and atraumatic without obvious abnormalities. No apparent alopecia or balding.      Otoscopic:     canals clear; tympanic membranes intact with normal light reflex      Hearing:     grossly intact      Nasal:     External nasal examination shows no deformity or inflammation. Nasal mucosa are pink and moist without lesions or exudates.      Dental:     good dentition      Pharynx:     tongue normal; posterior pharynx without erythema or exudate  Neck:      Neck:     supple; no masses; trachea midline      Thyroid:     no nodules, masses, tenderness, or enlargement  Respiratory:      Resp. effort:     no intercostal retractions or use of accessory muscles      Percussion:     no dullness      Palpation:     normal fremitus      Auscultation:     no rales, rhonchi, or wheezes  Chest Wall:      Chest wall:     no masses or gynecomastia      Axilla:     no axillary adenopathy  Cardiovascular:      Palpation:     no thrill or displacement of PMI      Auscultation:     normal S1 and  S2; no murmur, rub, or gallop  Gastrointestinal:      Abdomen:     soft and non-tender with normal bowel sounds; no masses      Liver/spleen:     normal to percussion; no enlargement or nodularity      Hernia:     no hernias      Rectal:     no masses or tenderness      Stool:     not done  Genitourinary:      Scrotum:  no lesions, cysts, edema, or rash      Penis:     no lesions or discharge      Prostate:     no enlargement or nodularity      Other:     lack of Left testicle  Musculoskeletal:      Gait/station:     normal gait; no ataxia  Lymphatic:      Neck:     no cervical adenopathy      Inguinal:     no inguinal  adenopathy  Skin:      Inspection:     no rashes, suspicious lesions, or ulcerations      Palpation:     no subcutaneous nodules or induration  Neurological:      Sensory:     intact to touch  Psychiatric:      Judgement:     intact      Orientation:     oriented to time, place, and person      Memory:     intact for recent and remote events      Mood/affect:     no appearance of anxiety, depression, or agitation

## 2010-11-02 NOTE — Letter (Signed)
Summary: Nadara Eaton letter  [place] [facility]  [address] [place], [place] [address]   Phone: [phone]  Fax: [phone]       05/11/2010 MRN: [id number]  ETHIN DRUMMOND [address] [place], [place]  [address]  Dear Mr. [facility] [place], and [place] announce the retirement of Arta Silence, M.D., from full-time practice at the [facility] office effective April 01, 2010 and his plans of returning part-time.  It is important to Dr. Hetty Ely and to our practice that you understand that [facility] has seven physicians in our office for your health care needs.  We will continue to offer the same exceptional care that you have today.    Dr. Hetty Ely has spoken to many of you about his plans for retirement and returning part-time in the fall.   We will continue to work with you through the transition to schedule appointments for you in the office and meet the high standards that [place] is committed to.   Again, it is with great pleasure that we share the news that Dr. Hetty Ely will return to [facility] in October of 2011 with a reduced schedule.    If you have any questions, or would like to request an appointment with one of our physicians, please call us at [phone] and press the option for Scheduling an appointment.  We take pleasure in providing you with excellent patient care and look forward to seeing you at your next office visit.  Our [facility] Physicians are:  Tillman Abide, M.D. Laurita Quint, M.D. Roxy Manns, M.D. Kerby Nora, M.D. [name], M.D. Ruthe Mannan, M.D. We proudly welcomed Raechel Ache, M.D. and Eustaquio Boyden, M.D. to the practice in July/August 2011.  Sincerely,  [facility]

## 2010-11-02 NOTE — Procedures (Signed)
Summary: Colonoscopy  Patient: Bryan Kirby Note: All result statuses are Final unless otherwise noted.  Tests: (1) Colonoscopy (COL)   COL Colonoscopy           DONE     [facility]     [address]     [place], [place]  [address]           COLONOSCOPY PROCEDURE REPORT           PATIENT:  Bryan, Kirby  MR#:  [id number]     BIRTHDATE:  December 05, 1955, 54 yrs. old  GENDER:  male           ENDOSCOPIST:  Barbette Hair. [name], MD     Referred by:  Elpidio Galea. Copland, M.D.           PROCEDURE DATE:  07/23/2010     PROCEDURE:  Colonoscopy with snare polypectomy     ASA CLASS:  Class II     INDICATIONS:  1) Routine Risk Screening           MEDICATIONS:   Fentanyl 75 mcg IV, Versed 6 mg IV           DESCRIPTION OF PROCEDURE:   After the risks benefits and     alternatives of the procedure were thoroughly explained, informed     consent was obtained.  Digital rectal exam was performed and     revealed no abnormalities.   The LB 180AL [id number] endoscope was     introduced through the anus and advanced to the cecum, which was     identified by both the appendix and ileocecal valve, without     limitations.  The quality of the prep was excellent, using     MiraLax.  The instrument was then slowly withdrawn as the colon     was fully examined.     <<PROCEDUREIMAGES>>           FINDINGS:  A sessile polyp was found in the descending colon. It     was 4 mm in size. Polyp was snared without cautery. Retrieval was     successful (see image13). snare polyp  This was otherwise a normal     examination of the colon (see image2, image4, image6, image7,     image10, image11, and image14).   Retroflexed views in the rectum     revealed no abnormalities.    The time to cecum =  4.75  minutes.     The scope was then withdrawn (time =  9.0  min) from the patient     and the procedure completed.           COMPLICATIONS:  None           ENDOSCOPIC IMPRESSION:     1) 4 mm sessile polyp in the  descending colon     2) Otherwise normal examination     RECOMMENDATIONS:     1) If the polyp(s) removed today are proven to be adenomatous     (pre-cancerous) polyps, you will need a repeat colonoscopy in 5     years. Otherwise you should continue to follow colorectal cancer     screening guidelines for "routine risk" patients with colonoscopy     in 10 years.           REPEAT EXAM:   You will receive a letter from Dr. [name] in 1-2     weeks, after reviewing the final pathology, with followup  recommendations.           ______________________________     Barbette Hair [name], MD           CC:           n.     eSIGNED:   Barbette Hair. Kaplan at 07/23/2010 09:33 AM           Nicoletta Dress, [id number]  Note: An exclamation mark (!) indicates a result that was not dispersed into the flowsheet. Document Creation Date: 07/23/2010 9:33 AM _______________________________________________________________________  (1) Order result status: Final Collection or observation date-time: 07/23/2010 09:28 Requested date-time:  Receipt date-time:  Reported date-time:  Referring Physician:   Ordering Physician: Melvia Heaps [phone]) Specimen Source:  Source: Launa Grill Order Number: [id number] Lab site:   Appended Document: Colonoscopy     Procedures Next Due Date:    Colonoscopy: 07/2015

## 2010-11-02 NOTE — Letter (Signed)
Summary: [place] Concealed [employer] Permits Form  [place] Concealed [employer] Permits Form   Imported By: Beau Fanny 01/05/2010 14:52:23  _____________________________________________________________________  External Attachment:    Type:   Image     Comment:   External Document

## 2010-11-02 NOTE — Miscellaneous (Signed)
Summary: [facility]  Clinical Lists Changes  Medications: Added new medication of MOVIPREP 100 GM  SOLR (PEG-KCL-NACL-NASULF-NA ASC-C) As per prep instructions. - Signed Rx of MOVIPREP 100 GM  SOLR (PEG-KCL-NACL-NASULF-NA ASC-C) As per prep instructions.;  #1 x 0;  Signed;  Entered by: Ezra Sites RN;  Authorized by: Louis Meckel MD;  Method used: Electronically to [facility]*, [address] [place], [place]  [address], Ph: [phone], Fax: [phone] Allergies: Changed allergy or adverse reaction from CODEINE to CODEINE    Prescriptions: MOVIPREP 100 GM  SOLR (PEG-KCL-NACL-NASULF-NA ASC-C) As per prep instructions.  #1 x 0   Entered by:   Ezra Sites RN   Authorized by:   Louis Meckel MD   Signed by:   Ezra Sites RN on 07/09/2010   Method used:   Electronically to        [facility]* (retail)       [address]       [place], [place]  [address]       Ph: [phone]       Fax: [phone]   RxID:   [id number]

## 2010-11-02 NOTE — Letter (Signed)
Summary: Patient Notice- Polyp Results  [facility]  [address] [place], [place] [address]   Phone: [phone]  Fax: [phone]        July 27, 2010 MRN: [id number]    Bryan Kirby [address] [place], [place]  [address]    Dear [name],  I am pleased to inform you that the colon polyp(s) removed during your recent colonoscopy was (were) found to be benign (no cancer detected) upon pathologic examination.  I recommend you have a repeat colonoscopy examination in 5_ years to look for recurrent polyps, as having colon polyps increases your risk for having recurrent polyps or even colon cancer in the future.  Should you develop new or worsening symptoms of abdominal pain, bowel habit changes or bleeding from the rectum or bowels, please schedule an evaluation with either your primary care physician or with me.  Additional information/recommendations:  __ No further action with gastroenterology is needed at this time. Please      follow-up with your primary care physician for your other healthcare      needs.  __ Please call [phone] to schedule a return visit to review your      situation.  __ Please keep your follow-up visit as already scheduled.  _x_ Continue treatment plan as outlined the day of your exam.  Please call us if you are having persistent problems or have questions about your condition that have not been fully answered at this time.  Sincerely,  Louis Meckel MD  This letter has been electronically signed by your physician.  Appended Document: Patient Notice- Polyp Results letter mailed 10.28.11

## 2011-01-04 ENCOUNTER — Telehealth: Payer: Self-pay | Admitting: *Deleted

## 2011-01-04 NOTE — Telephone Encounter (Signed)
Please alter script and med list to reflect  Lexapro 20 mg, 1 po daily, #30, 5 refills

## 2011-01-04 NOTE — Telephone Encounter (Signed)
Pt has been taking lexapro 10 mg's.  He started taking 2 a day and noticed that was more effective.  He would like his script changed to 2 a day and a new script sent to [facility], requests a 3 month supply.

## 2011-01-05 NOTE — Telephone Encounter (Signed)
Jacki Cones called Rx to [facility].

## 2011-02-18 NOTE — Op Note (Signed)
NAME:  Bryan Kirby, Bryan Kirby                         ACCOUNT NO.:  [id number]   MEDICAL RECORD NO.:  [id number]                   PATIENT TYPE:  AMB   LOCATION:  DSC                                  FACILITY:  [facility]   PHYSICIAN:  Claude Manges. Cleophas Dunker, M.D.            DATE OF BIRTH:  [birth year]   DATE OF PROCEDURE:  02/06/2004  DATE OF DISCHARGE:                                 OPERATIVE REPORT   PREOPERATIVE DIAGNOSIS:  Displaced bucket handle tear, lateral meniscus,  left knee.   POSTOPERATIVE DIAGNOSIS:  Displaced bucket handle tear, lateral meniscus,  left knee, with chondromalacia of the patella.   PROCEDURES:  1. Diagnostic arthroscopy, left knee.  2. Arthroscopic partial lateral meniscectomy.  3. Shaving of patella.   SURGEON:  Claude Manges. Cleophas Dunker, M.D.   ANESTHESIA:  IV sedation and local Xylocaine with epinephrine.   COMPLICATIONS:  None.   HISTORY:  A 55 year old gentleman who was recently seen in the office for  evaluation of a painful left knee.  The pain was localized on the lateral  compartment, associated with inability to fully extend the knee.  With the  clinical suspicion of a bucket handle or displaced tear of the lateral  meniscus, an MRI scan was performed confirming the diagnosis.  He is now to  have arthroscopic evaluation.   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   PROCEDURE:  With the patient comfortable on the operating table and under IV  sedation, the left lower extremity was placed in a thigh holder.  The leg  was then prepped with Duraprep from the thigh holder to the ankle.  Sterile  draping was performed.   Diagnostic arthroscopy was performed using a medial and lateral parapatellar  tendon puncture site.  There was very minimal effusion.   Diagnostic arthroscopy revealed considerable chondromalacia of the patella.  The patella appeared to track in the midline.  A  shaver was introduced and  shaving of the patella was performed, removing all of the loose articular  cartilage and stabilizing the cartilage.  There was some synovitis in the  superior pouch, which was debrided.  I did not see any loose bodies.   The medial compartment was clear of meniscal pathology or significant  chondromalacia.   The ACL appeared to be intact.   The lateral compartment revealed a displaced bucket handle tear of the  lateral meniscus.  It was displaced anteriorly and appeared to originate at  the very posterior horn and extend above the midportion of the meniscus.  Using a series of small instruments, the meniscus was debrided at both its  anterior and posterior margins, and one large piece was removed through the  lateral puncture site.  The intra-articular shaver was then introduced to  debride any further fraying of the remaining rim and to taper the transition  between  the normal meniscus and torn meniscus.  The basket forceps was used  to remove the posterior horn as it was still mobile.  An ArthroCare wand was  used for coagulation and to stabilize the remaining meniscal rim.  The joint  was then copiously irrigated with saline solution. The joint was inspected  without evidence of loose material.  I did establish a third portal through  the mid-patellar tendon and an irrigation portal in the superior pouch  medially.  Each of the wounds were left open and infiltrated with 0.25%  Marcaine with epinephrine.  A sterile bulky dressing was applied followed by  an Ace bandage.   PLAN:  Vicodin for pain.  Continue with his aspirin.  I will see him in the  office early in the week.                                               Claude Manges. Cleophas Dunker, M.D.    PWW/MEDQ  D:  02/06/2004  T:  02/07/2004  Job:  [id number]

## 2011-05-18 ENCOUNTER — Ambulatory Visit (INDEPENDENT_AMBULATORY_CARE_PROVIDER_SITE_OTHER): Payer: BC Managed Care – PPO | Admitting: Family Medicine

## 2011-05-18 ENCOUNTER — Encounter: Payer: Self-pay | Admitting: Family Medicine

## 2011-05-18 VITALS — BP 110/78 | HR 72 | Temp 98.4°F | Ht 71.5 in | Wt 280.8 lb

## 2011-05-18 DIAGNOSIS — J209 Acute bronchitis, unspecified: Secondary | ICD-10-CM

## 2011-05-18 MED ORDER — AZITHROMYCIN 250 MG PO TABS: ORAL_TABLET | ORAL | Status: AC

## 2011-05-18 NOTE — Progress Notes (Signed)
  Subjective:    Patient ID: Bryan Kirby, male    DOB: October 15, 1955, 55 y.o.   MRN: [id number]  HPI  Bryan Kirby, a 55 y.o. male presents today in the office for the following:    Sick for about a wek, downin the chest mostly.  Some in the phlegm  Taking some mucinex. Broke up for a few days. Last night, woke up and feeling bad.  Doing worse now -- moved all into his chest AF, no chills or sweats  The PMH, PSH, Social History, Family History, Medications, and allergies have been reviewed in [facility], and have been updated if relevant.  Review of Systems ROS: GEN: Acute illness details above GI: Tolerating PO intake GU: maintaining adequate hydration and urination Pulm: No SOB Interactive and getting along well at home.  Otherwise, ROS is as per the HPI.     Objective:   Physical Exam   Physical Exam  Blood pressure 110/78, pulse 72, temperature 98.4 F (36.9 C), temperature source Oral, height 5' 11.5" (1.816 m), weight 280 lb 12.8 oz (127.37 kg), SpO2 96.00%.  GEN: A and O x 3. WDWN. NAD.    ENT: Nose clear, ext NML.  No LAD.  No JVD.  TM's clear. Oropharynx clear.  PULM: Normal WOB, no distress. No crackles. Few scattered wheezes. CV: RRR, no M/G/R, No rubs, No JVD.   ABD: S, NT, ND, + BS. No rebound. No guarding. No HSM.   EXT: warm and well-perfused, No c/c/e. PSYCH: Pleasant and conversant.     Assessment & Plan:   1. Bronchitis with bronchospasm  azithromycin (ZITHROMAX) 250 MG tablet   BRONCHITIS -Viral or baterial infections of the lung. Fever, cough, chest pain, shortness of breath, phlegm production, fatigue are symptoms.  Treatment: 1. Take all medicines 2. Antibiotics  3. Cough suppressants 5. Expectorant like Guaifenesin (Robitussin, Mucinex) - cont mucinex  Fluids and Moisture help: drink lots of fluids

## 2011-06-07 ENCOUNTER — Other Ambulatory Visit: Payer: Self-pay | Admitting: *Deleted

## 2011-06-07 MED ORDER — SIMVASTATIN 40 MG PO TABS: 40.0000 mg | ORAL_TABLET | Freq: Every day | ORAL | Status: DC

## 2011-07-05 ENCOUNTER — Other Ambulatory Visit: Payer: Self-pay | Admitting: *Deleted

## 2011-07-05 MED ORDER — ESCITALOPRAM OXALATE 10 MG PO TABS: 10.0000 mg | ORAL_TABLET | Freq: Every day | ORAL | Status: DC

## 2011-07-12 ENCOUNTER — Other Ambulatory Visit (INDEPENDENT_AMBULATORY_CARE_PROVIDER_SITE_OTHER): Payer: BC Managed Care – PPO

## 2011-07-12 DIAGNOSIS — E785 Hyperlipidemia, unspecified: Secondary | ICD-10-CM

## 2011-07-12 DIAGNOSIS — Z79899 Other long term (current) drug therapy: Secondary | ICD-10-CM

## 2011-07-12 DIAGNOSIS — Z125 Encounter for screening for malignant neoplasm of prostate: Secondary | ICD-10-CM

## 2011-07-12 LAB — LIPID PANEL
Cholesterol: 150 mg/dL (ref 0–200)
LDL Cholesterol: 85 mg/dL (ref 0–99)
Triglycerides: 95 mg/dL (ref 0.0–149.0)
VLDL: 19 mg/dL (ref 0.0–40.0)

## 2011-07-12 LAB — BASIC METABOLIC PANEL
Chloride: 103 mEq/L (ref 96–112)
Creatinine, Ser: 1 mg/dL (ref 0.4–1.5)
Potassium: 5.1 mEq/L (ref 3.5–5.1)

## 2011-07-12 LAB — HEPATIC FUNCTION PANEL
ALT: 25 U/L (ref 0–53)
AST: 31 U/L (ref 0–37)
Bilirubin, Direct: 0.1 mg/dL (ref 0.0–0.3)
Total Bilirubin: 0.9 mg/dL (ref 0.3–1.2)

## 2011-07-12 LAB — CBC WITH DIFFERENTIAL/PLATELET
Basophils Absolute: 0 10*3/uL (ref 0.0–0.1)
Eosinophils Absolute: 0.1 10*3/uL (ref 0.0–0.7)
Lymphocytes Relative: 23.3 % (ref 12.0–46.0)
MCHC: 33.5 g/dL (ref 30.0–36.0)
Neutrophils Relative %: 64.1 % (ref 43.0–77.0)
RDW: 14 % (ref 11.5–14.6)

## 2011-07-19 ENCOUNTER — Encounter: Payer: Self-pay | Admitting: Family Medicine

## 2011-07-19 ENCOUNTER — Ambulatory Visit (INDEPENDENT_AMBULATORY_CARE_PROVIDER_SITE_OTHER): Payer: BC Managed Care – PPO | Admitting: Family Medicine

## 2011-07-19 VITALS — BP 120/82 | HR 78 | Temp 97.8°F | Ht 71.5 in | Wt 285.8 lb

## 2011-07-19 DIAGNOSIS — Z0000 Encounter for general adult medical examination without abnormal findings: Secondary | ICD-10-CM

## 2011-07-19 MED ORDER — ESCITALOPRAM OXALATE 20 MG PO TABS: 20.0000 mg | ORAL_TABLET | Freq: Every day | ORAL | Status: DC

## 2011-07-19 NOTE — Progress Notes (Signed)
Subjective:    Patient ID: Bryan Kirby, male    DOB: [birth year], 55 y.o.   MRN: [id number]  HPI  [name], a 55 y.o. male presents today in the office for the following:    Declines prostate DRE Father and brother Kateri Mc) both Prostate CA, dx 53's.  Having a lot of gas. Getting constiated. Taking some fiber powder. Almost every day constipated.   Preventative Health Maintenance Visit:  Health Maintenance Summary Reviewed and updated, unless pt declines services.  Tobacco History Reviewed. Alcohol: No concerns, no excessive use Exercise Habits: Some activity, rec at least 30 mins 5 times a week STD concerns: no risk or activity to increase risk Drug Use: None Encouraged self-testicular check  Health Maintenance  Topic Date Due  . Tetanus/tdap  05/09/2009  . Influenza Vaccine  07/04/2011  . Colonoscopy  05/28/2019    Labs reviewed with the patient.   Lipids:    Component Value Date/Time   CHOL 150 07/12/2011 0916   TRIG 95.0 07/12/2011 0916   HDL 45.60 07/12/2011 0916   LDLDIRECT 101.5 06/12/2007 0858   VLDL 19.0 07/12/2011 0916   CHOLHDL 3 07/12/2011 0916    CBC:    Component Value Date/Time   WBC 6.4 07/12/2011 0916   HGB 16.1 07/12/2011 0916   HCT 47.9 07/12/2011 0916   PLT 226.0 07/12/2011 0916   MCV 95.7 07/12/2011 0916   NEUTROABS 4.1 07/12/2011 0916   LYMPHSABS 1.5 07/12/2011 0916   MONOABS 0.7 07/12/2011 0916   EOSABS 0.1 07/12/2011 0916   BASOSABS 0.0 07/12/2011 0916    Basic Metabolic Panel:    Component Value Date/Time   NA 140 07/12/2011 0916   K 5.1 07/12/2011 0916   CL 103 07/12/2011 0916   CO2 31 07/12/2011 0916   BUN 13 07/12/2011 0916   CREATININE 1.0 07/12/2011 0916   GLUCOSE 96 07/12/2011 0916   CALCIUM 9.5 07/12/2011 0916    Lab Results  Component Value Date   ALT 25 07/12/2011   AST 31 07/12/2011   ALKPHOS 54 07/12/2011   BILITOT 0.9 07/12/2011    Lab Results  Component Value Date   PSA 0.58 07/12/2011   PSA 0.48 05/24/2010   PSA 0.51  05/06/2009    Patient Active Problem List  Diagnoses  . HYPERLIPIDEMIA  . OVERWEIGHT  . DISORDER, DEPRESSIVE NEC  . ALLERGIC RHINITIS  . HERNIATED DISC  L1/2 R SIDE  . FATIGUE   Past Medical History  Diagnosis Date  . Allergic rhinitis due to pollen   . Hyperlipidemia    Past Surgical History  Procedure Date  . Eye surgery   . Polypectomy     OF PENIS  . Cystectomy   . 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 Knee arthroscopy     LEFT LAT MENISCUS TEAR  . Knee surgery     LEFT KNEE CAP WIRING AND PATELLAR REATTACHMENT   History  Substance Use Topics  . Smoking status: Former Games developer  . Smokeless tobacco: Not on file  . Alcohol Use: Not on file   Family History  Problem Relation Age of Onset  . Fibromyalgia Mother   . Depression Mother   . Cancer Father     PROSTATECTOMY  . Stroke Father   . Cancer Sister     CERVICAL  . Stroke Paternal Grandfather   . Hypertension Paternal Grandfather   . Diabetes Paternal Grandfather    Allergies  Allergen Reactions  . Codeine  REACTION: itching  . Penicillins     REACTION: UNSPECIFIED   Current Outpatient Prescriptions on File Prior to Visit  Medication Sig Dispense Refill  . omeprazole (PRILOSEC) 20 MG capsule Take 20 mg by mouth daily.        . simvastatin (ZOCOR) 40 MG tablet Take 1 tablet (40 mg total) by mouth at bedtime.  30 tablet  6     Review of Systems General: Denies fever, chills, sweats. No significant weight loss. Eyes: Denies blurring,significant itching ENT: Denies earache, sore throat, and hoarseness. Cardiovascular: Denies chest pains, palpitations, dyspnea on exertion Respiratory: Denies cough, dyspnea at rest,wheeezing Breast: no concerns about lumps GI: Denies nausea, vomiting, diarrhea, + constipation, no abdominal pain, melena, hematochezia GU: Denies penile discharge, ED, urinary flow / outflow problems. No STD concerns. Musculoskeletal: Denies back pain, joint pain Derm: Denies  rash, itching Neuro: Denies  paresthesias, frequent falls, frequent headaches Psych: Denies depression, anxiety Endocrine: Denies cold intolerance, heat intolerance, polydipsia Heme: Denies enlarged lymph nodes Allergy: No hayfever     Objective:   Physical Exam   Physical Exam  Blood pressure 120/82, pulse 78, temperature 97.8 F (36.6 C), temperature source Oral, height 5' 11.5" (1.816 m), weight 285 lb 12.8 oz (129.638 kg), SpO2 98.00%.  PE: GEN: well developed, well nourished, no acute distress Eyes: conjunctiva and lids normal, PERRLA, EOMI ENT: TM clear, nares clear, oral exam WNL Neck: supple, no lymphadenopathy, no thyromegaly, no JVD Pulm: clear to auscultation and percussion, respiratory effort normal CV: regular rate and rhythm, S1-S2, no murmur, rub or gallop, no bruits, peripheral pulses normal and symmetric, no cyanosis, clubbing, edema or varicosities Chest: no scars, masses, no gynecomastia   GI: soft, non-tender; no hepatosplenomegaly, masses; active bowel sounds all quadrants GU: no hernia, testicular mass, penile discharge, priapism or prostate enlargement Lymph: no cervical, axillary or inguinal adenopathy MSK: gait normal, muscle tone and strength WNL, no joint swelling, effusions, discoloration, crepitus  SKIN: clear, good turgor, color WNL, no rashes, lesions, or ulcerations Neuro: normal mental status, normal strength, sensation, and motion Psych: alert; oriented to person, place and time, normally interactive and not anxious or depressed in appearance.      Assessment & Plan:   1. Routine general medical examination at a health care facility     The patient's preventative maintenance and recommended screening tests for an annual wellness exam were reviewed in full today. Brought up to date unless services declined.  Counselled on the importance of diet, exercise, and its role in overall health and mortality. The patient's FH and SH was reviewed,  including their home life, tobacco status, and drug and alcohol status.

## 2011-07-19 NOTE — Patient Instructions (Signed)
Lose weight  CONSTIPATION Difficult, uncomfortable, infrequent BM  1. Warm prune juice, hot water, tea, coffee, apple juice 2. Prevention: drink 8 glasses water daily, FIBER (raw fruit, veggies, bran cereal, whole grains), regular exercise 3. Bulk formers like Metamucil (psyllium), Citrucel (methylcellulose) usually help 4. Stool softeners (Docusate) occaisionally OK 5. Occaisional over the counter Miralax usually safe

## 2011-10-26 ENCOUNTER — Ambulatory Visit (INDEPENDENT_AMBULATORY_CARE_PROVIDER_SITE_OTHER): Payer: BC Managed Care – PPO | Admitting: Family Medicine

## 2011-10-26 ENCOUNTER — Encounter: Payer: Self-pay | Admitting: Family Medicine

## 2011-10-26 ENCOUNTER — Telehealth: Payer: Self-pay | Admitting: Family Medicine

## 2011-10-26 VITALS — BP 120/70 | HR 90 | Temp 99.7°F | Ht 71.5 in | Wt 285.8 lb

## 2011-10-26 DIAGNOSIS — J111 Influenza due to unidentified influenza virus with other respiratory manifestations: Secondary | ICD-10-CM

## 2011-10-26 MED ORDER — OSELTAMIVIR PHOSPHATE 75 MG PO CAPS: 75.0000 mg | ORAL_CAPSULE | Freq: Two times a day (BID) | ORAL | Status: AC

## 2011-10-26 NOTE — Progress Notes (Signed)
Patient Name: Bryan Kirby Date of Birth: November 02, 1955 Medical Record Number: [id number] Gender: male  History of Present Illness:  Bryan Kirby presents with runny nose, sneezing, cough, sore throat, malaise, myalgias, arthralgia, chills, and fever. Started Sunday. Got up on Sunday morning and got achier as the day went on -- Temp went up to 102.5 on Sunday night. Then went up and down and had a little diarrhea. Feeling achy and terrible and some congestion. Felt maybe a little better this morning. Then temp went up to 101.2, then up to 101.5.   + recent exposure to others with similar symptoms.   The patent denies sore throat as the primary complaint. Denies sthortness of breath/wheezing, otalgia, facial pain, abdominal pain, changes in bowel or bladder.  Generally feels terrible  Tmax: 102.5  PMH, PHS, Allergies, Problem List, Medications, Family History, and Social History have all been reviewed.  Review of Systems: as above, eating and drinking - tolerating PO. Urinating normally. No excessive vomitting or diarrhea. O/w as above.  Physical Exam:  Filed Vitals:   10/26/11 1428  BP: 120/70  Pulse: 90  Temp: 99.7 F (37.6 C)  TempSrc: Oral  Height: 5' 11.5" (1.816 m)  Weight: 285 lb 12.8 oz (129.638 kg)  SpO2: 95%    Gen: WDWN, NAD; A & O x3, cooperative. Pleasant.Globally Non-toxic HEENT: Normocephalic and atraumatic. Throat clear, w/o exudate, R TM clear, L TM - good landmarks, No fluid present. rhinnorhea. No frontal or maxillary sinus T. MMM NECK: Anterior cervical  LAD is absent CV: RRR, No M/G/R, cap refill <2 sec PULM: Breathing comfortably in no respiratory distress. no wheezing, crackles, rhonchi ABD: S,NT,ND,+BS. No HSM. No rebound. EXT: No c/c/e PSYCH: Friendly, good eye contact MSK: Nml gait  Assessment and Plan: 1. Influenza: The patient's clinical exam and history is consistent with a diagnosis of influenza. Tamiflu  Supportive  care. Fluids. Cough medicines as needed  Anti-pyretics.  Infection control emphasized, including OOW or school until AF 24 hours.

## 2011-10-26 NOTE — Telephone Encounter (Signed)
Triage Record Num: [id number] Operator: Freddie Breech Patient Name: Bryan Kirby Call Date & Time: 10/26/2011 11:59:04AM Patient Phone: [phone] PCP: [name] Patient Gender: Male PCP Fax : [phone] Patient DOB: 1956-05-14 Practice Name: [facility] Reason for Call: Caller: Mahin/Patient; PCP: [name] T.; CB#: [phone]; ; ; Call regarding Fever and Body Aches onset 10/23/11. Temp 101.2 this AM. Pt is requesting something to knock this out. Pt is advised he will need an OV. Emergent sx r/o. Appt sched for 1430 today with Dr. Patsy Lager. Information Protocol. Protocol(s) Used: Information Only Calls, No Triage Recommended Outcome per Protocol: Provide Information or Advice Only Reason for Outcome: Requesting regular office appointment (Advise to call office when open.) Care Advice: ~ 10/26/2011 12:10:55PM Page 1 of 1 [email or web address]

## 2011-12-16 ENCOUNTER — Ambulatory Visit: Payer: BC Managed Care – PPO | Admitting: Family Medicine

## 2011-12-21 ENCOUNTER — Ambulatory Visit: Payer: BC Managed Care – PPO | Admitting: Family Medicine

## 2012-01-20 ENCOUNTER — Other Ambulatory Visit: Payer: Self-pay | Admitting: *Deleted

## 2012-01-20 MED ORDER — SIMVASTATIN 40 MG PO TABS: 40.0000 mg | ORAL_TABLET | Freq: Every day | ORAL | Status: DC

## 2012-07-18 ENCOUNTER — Other Ambulatory Visit: Payer: Self-pay | Admitting: *Deleted

## 2012-07-18 NOTE — Telephone Encounter (Signed)
Received faxed refill request from pharmacy. Patient also called requesting refill from pharmacy. Last office visit 10/26/11, acute visit, physical 07/19/11. Patient states that he took his last pill today. Is it okay to refill medication?

## 2012-07-19 MED ORDER — ESCITALOPRAM OXALATE 20 MG PO TABS: 20.0000 mg | ORAL_TABLET | Freq: Every day | ORAL | Status: DC

## 2012-07-19 NOTE — Telephone Encounter (Signed)
Medicine sent to pharmacy with note that pt needs appt for physical before further refills.

## 2012-07-19 NOTE — Telephone Encounter (Signed)
Refill until CPX made.

## 2012-08-17 ENCOUNTER — Other Ambulatory Visit: Payer: Self-pay

## 2012-08-17 MED ORDER — ESCITALOPRAM OXALATE 20 MG PO TABS: 20.0000 mg | ORAL_TABLET | Freq: Every day | ORAL | Status: DC

## 2012-08-17 NOTE — Telephone Encounter (Signed)
Pt left v/m requesting refill lexapro to [facility]; pt has appt with Dr Patsy Lager 08/20/12. Pt is out of med. Pt notified Lexapro refilled # 30 to [facility]; pt will keep appt on Mon.

## 2012-08-20 ENCOUNTER — Ambulatory Visit: Payer: BC Managed Care – PPO | Admitting: Family Medicine

## 2012-08-22 ENCOUNTER — Encounter: Payer: Self-pay | Admitting: Family Medicine

## 2012-08-22 ENCOUNTER — Ambulatory Visit (INDEPENDENT_AMBULATORY_CARE_PROVIDER_SITE_OTHER): Payer: BC Managed Care – PPO | Admitting: Family Medicine

## 2012-08-22 VITALS — BP 124/80 | HR 72 | Temp 98.3°F | Ht 71.5 in | Wt 277.0 lb

## 2012-08-22 DIAGNOSIS — Z79899 Other long term (current) drug therapy: Secondary | ICD-10-CM

## 2012-08-22 DIAGNOSIS — E785 Hyperlipidemia, unspecified: Secondary | ICD-10-CM

## 2012-08-22 DIAGNOSIS — Z125 Encounter for screening for malignant neoplasm of prostate: Secondary | ICD-10-CM

## 2012-08-22 DIAGNOSIS — Z0000 Encounter for general adult medical examination without abnormal findings: Secondary | ICD-10-CM

## 2012-08-22 DIAGNOSIS — Z23 Encounter for immunization: Secondary | ICD-10-CM

## 2012-08-22 LAB — CBC WITH DIFFERENTIAL/PLATELET
Basophils Relative: 0.3 % (ref 0.0–3.0)
Eosinophils Relative: 1.2 % (ref 0.0–5.0)
HCT: 46.3 % (ref 39.0–52.0)
Lymphs Abs: 1.2 10*3/uL (ref 0.7–4.0)
MCHC: 32.8 g/dL (ref 30.0–36.0)
MCV: 95.7 fl (ref 78.0–100.0)
Monocytes Absolute: 0.6 10*3/uL (ref 0.1–1.0)
Platelets: 230 10*3/uL (ref 150.0–400.0)
WBC: 6.3 10*3/uL (ref 4.5–10.5)

## 2012-08-22 LAB — BASIC METABOLIC PANEL
BUN: 15 mg/dL (ref 6–23)
CO2: 30 mEq/L (ref 19–32)
Chloride: 105 mEq/L (ref 96–112)
Potassium: 4.8 mEq/L (ref 3.5–5.1)

## 2012-08-22 LAB — LDL CHOLESTEROL, DIRECT: Direct LDL: 83.4 mg/dL

## 2012-08-22 LAB — HEPATIC FUNCTION PANEL
ALT: 23 U/L (ref 0–53)
Total Bilirubin: 0.6 mg/dL (ref 0.3–1.2)
Total Protein: 7 g/dL (ref 6.0–8.3)

## 2012-08-22 MED ORDER — SIMVASTATIN 40 MG PO TABS: 40.0000 mg | ORAL_TABLET | Freq: Every day | ORAL | Status: DC

## 2012-08-22 MED ORDER — ESCITALOPRAM OXALATE 20 MG PO TABS: 20.0000 mg | ORAL_TABLET | Freq: Every day | ORAL | Status: DC

## 2012-08-22 NOTE — Progress Notes (Addendum)
Nature conservation officer at [facility] [address] [place] [address] Phone: [phone] Fax: [phone]  Date:  08/22/2012   Name:  Bryan Kirby   DOB:  Oct 09, 1955   MRN:  [id number] Gender: male Age: 56 y.o.  PCP:  [name], MD  Evaluating MD: [name], MD   Chief Complaint: Follow-up and Medication Refill   History of Present Illness:  Bryan Kirby is a 56 y.o. pleasant patient who presents with the following:  CPX:   If had not eaten, will get a little shaky, has been more frequent now.   Lipids: Doing well, stable. Tolerating meds fine with no SE. Panel reviewed with patient.  Lipids:    Component Value Date/Time   CHOL 150 07/12/2011 0916   TRIG 95.0 07/12/2011 0916   HDL 45.60 07/12/2011 0916   LDLDIRECT 83.4 08/22/2012 0852   VLDL 19.0 07/12/2011 0916   CHOLHDL 3 07/12/2011 0916    Lab Results  Component Value Date   ALT 23 08/22/2012   AST 35 08/22/2012   ALKPHOS 53 08/22/2012   BILITOT 0.6 08/22/2012   F/u lexapro. stable  Wt Readings from Last 3 Encounters:  08/22/12 277 lb (125.646 kg)  10/26/11 285 lb 12.8 oz (129.638 kg)  07/19/11 285 lb 12.8 oz (129.638 kg)   Preventative Health Maintenance Visit:  Health Maintenance Summary Reviewed and updated, unless pt declines services.  Tobacco History Reviewed. Alcohol: No concerns, no excessive use Exercise Habits: Some activity, rec at least 30 mins 5 times a week STD concerns: no risk or activity to increase risk Drug Use: None Encouraged self-testicular check  Health Maintenance  Topic Date Due  . Tetanus/tdap  05/09/2009  . Influenza Vaccine  06/03/2013  . Colonoscopy  05/28/2019    Labs reviewed with the patient.  Results for orders placed in visit on 08/22/12  BASIC METABOLIC PANEL      Component Value Range   Sodium 139  135 - 145 mEq/L   Potassium 4.8  3.5 - 5.1 mEq/L   Chloride 105  96 - 112 mEq/L   CO2 30  19 - 32 mEq/L   Glucose, Bld 107 (*) 70 - 99  mg/dL   BUN 15  6 - 23 mg/dL   Creatinine, Ser 1.1  0.4 - 1.5 mg/dL   Calcium 9.4  8.4 - 86.5 mg/dL   GFR 78.46  >96.29 mL/min  CBC WITH DIFFERENTIAL      Component Value Range   WBC 6.3  4.5 - 10.5 K/uL   RBC 4.83  4.22 - 5.81 Mil/uL   Hemoglobin 15.2  13.0 - 17.0 g/dL   HCT 52.8  41.3 - 24.4 %   MCV 95.7  78.0 - 100.0 fl   MCHC 32.8  30.0 - 36.0 g/dL   RDW 01.0  27.2 - 53.6 %   Platelets 230.0  150.0 - 400.0 K/uL   Neutrophils Relative 69.7  43.0 - 77.0 %   Lymphocytes Relative 19.2  12.0 - 46.0 %   Monocytes Relative 9.6  3.0 - 12.0 %   Eosinophils Relative 1.2  0.0 - 5.0 %   Basophils Relative 0.3  0.0 - 3.0 %   Neutro Abs 4.4  1.4 - 7.7 K/uL   Lymphs Abs 1.2  0.7 - 4.0 K/uL   Monocytes Absolute 0.6  0.1 - 1.0 K/uL   Eosinophils Absolute 0.1  0.0 - 0.7 K/uL   Basophils Absolute 0.0  0.0 - 0.1 K/uL  HEPATIC FUNCTION PANEL      Component Value Range   Total Bilirubin 0.6  0.3 - 1.2 mg/dL   Bilirubin, Direct 0.1  0.0 - 0.3 mg/dL   Alkaline Phosphatase 53  39 - 117 U/L   AST 35  0 - 37 U/L   ALT 23  0 - 53 U/L   Total Protein 7.0  6.0 - 8.3 g/dL   Albumin 4.0  3.5 - 5.2 g/dL  LDL CHOLESTEROL, DIRECT      Component Value Range   Direct LDL 83.4    PSA      Component Value Range   PSA 0.46  0.10 - 4.00 ng/mL     Patient Active Problem List  Diagnosis  . HYPERLIPIDEMIA  . OVERWEIGHT  . DISORDER, DEPRESSIVE NEC  . ALLERGIC RHINITIS  . HERNIATED DISC  L1/2 R SIDE  . FATIGUE    Past Medical History  Diagnosis Date  . Allergic rhinitis due to pollen   . Hyperlipidemia     Past Surgical History  Procedure Date  . Eye surgery   . Polypectomy     OF PENIS  . Cystectomy   . 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 Knee arthroscopy     LEFT LAT MENISCUS TEAR  . Knee surgery     LEFT KNEE CAP WIRING AND PATELLAR REATTACHMENT    History  Substance Use Topics  . Smoking status: Former Games developer  . Smokeless tobacco: Not on file  . Alcohol Use: Not  on file    Family History  Problem Relation Age of Onset  . Fibromyalgia Mother   . Depression Mother   . Cancer Father     PROSTATECTOMY  . Stroke Father   . Cancer Sister     CERVICAL  . Stroke Paternal Grandfather   . Hypertension Paternal Grandfather   . Diabetes Paternal Grandfather     Allergies  Allergen Reactions  . Codeine     REACTION: itching  . Penicillins     REACTION: UNSPECIFIED    Medication list has been reviewed and updated.  Outpatient Prescriptions Prior to Visit  Medication Sig Dispense Refill  . escitalopram (LEXAPRO) 20 MG tablet Take 1 tablet (20 mg total) by mouth daily.  30 tablet  0  . omeprazole (PRILOSEC) 20 MG capsule Take 20 mg by mouth daily.        . simvastatin (ZOCOR) 40 MG tablet Take 1 tablet (40 mg total) by mouth at bedtime.  30 tablet  6  . glucosamine-chondroitin 500-400 MG tablet Take 1 tablet by mouth 3 (three) times daily.        . methylcellulose oral powder Take 1 packet by mouth daily.        . saw palmetto 500 MG capsule Take 500 mg by mouth daily.         Last reviewed on 08/22/2012  8:31 AM by [name], MD  Review of Systems:   General: Denies fever, chills, sweats. No significant weight loss. Eyes: Denies blurring,significant itching ENT: Denies earache, sore throat, and hoarseness. Cardiovascular: Denies chest pains, palpitations, dyspnea on exertion Respiratory: Denies cough, dyspnea at rest,wheeezing Breast: no concerns about lumps GI: Denies nausea, vomiting, diarrhea, constipation, change in bowel habits, abdominal pain, melena, hematochezia GU: Denies penile discharge, ED, urinary flow / outflow problems. No STD concerns. Musculoskeletal: Denies back pain, joint pain Derm: Denies rash, itching Neuro: Denies  paresthesias, frequent falls, frequent headaches Psych: Denies depression, anxiety  Endocrine: Denies cold intolerance, heat intolerance, polydipsia Heme: Denies enlarged lymph nodes Allergy: No  hayfever   Physical Examination: Filed Vitals:   08/22/12 0817  BP: 124/80  Pulse: 72  Temp: 98.3 F (36.8 C)  TempSrc: Oral  Height: 5' 11.5" (1.816 m)  Weight: 277 lb (125.646 kg)  SpO2: 97%    Body mass index is 38.10 kg/(m^2). Ideal Body Weight: Weight in (lb) to have BMI = 25: 181.4    Wt Readings from Last 3 Encounters:  08/22/12 277 lb (125.646 kg)  10/26/11 285 lb 12.8 oz (129.638 kg)  07/19/11 285 lb 12.8 oz (129.638 kg)    GEN: well developed, well nourished, no acute distress Eyes: conjunctiva and lids normal, PERRLA, EOMI ENT: TM clear, nares clear, oral exam WNL Neck: supple, no lymphadenopathy, no thyromegaly, no JVD Pulm: clear to auscultation and percussion, respiratory effort normal CV: regular rate and rhythm, S1-S2, no murmur, rub or gallop, no bruits, peripheral pulses normal and symmetric, no cyanosis, clubbing, edema or varicosities Chest: no scars, masses GI: soft, non-tender; no hepatosplenomegaly, masses; active bowel sounds all quadrants GU: no hernia, testicular mass, penile discharge, or prostate enlargement Lymph: no cervical, axillary or inguinal adenopathy MSK: gait normal, muscle tone and strength WNL, no joint swelling, effusions, discoloration, crepitus - PROMINENT BUNIONS AND CAVUS FEET SKIN: clear, good turgor, color WNL, no rashes, lesions, or ulcerations Neuro: normal mental status, normal strength.  Psych: alert; oriented to person, place and time, normally interactive and not anxious or depressed in appearance.   Assessment and Plan:  1. Routine general medical examination at a health care facility    2. Need for prophylactic vaccination and inoculation against influenza  Flu vaccine greater than or equal to 3yo preservative free IM  3. Other and unspecified hyperlipidemia  LDL cholesterol, direct  4. Special screening for malignant neoplasm of prostate  PSA  5. Encounter for long-term (current) use of other medications  Basic  metabolic panel, CBC with Differential, Hepatic function panel   The patient's preventative maintenance and recommended screening tests for an annual wellness exam were reviewed in full today. Brought up to date unless services declined.  Counselled on the importance of diet, exercise, and its role in overall health and mortality. The patient's FH and SH was reviewed, including their home life, tobacco status, and drug and alcohol status.   Work on [employer].  Exercise. Refills  Orders Today:  Orders Placed This Encounter  Procedures  . Flu vaccine greater than or equal to 3yo preservative free IM  . Basic metabolic panel  . CBC with Differential  . Hepatic function panel  . LDL cholesterol, direct  . PSA    Updated Medication List: (Includes new medications, updates to list, dose adjustments) Meds ordered this encounter  Medications  . simvastatin (ZOCOR) 40 MG tablet    Sig: Take 1 tablet (40 mg total) by mouth at bedtime.    Dispense:  30 tablet    Refill:  11  . escitalopram (LEXAPRO) 20 MG tablet    Sig: Take 1 tablet (20 mg total) by mouth daily.    Dispense:  30 tablet    Refill:  11    Medications Discontinued: Medications Discontinued During This Encounter  Medication Reason  . simvastatin (ZOCOR) 40 MG tablet Reorder  . escitalopram (LEXAPRO) 20 MG tablet Reorder     [name], MD

## 2012-08-23 ENCOUNTER — Encounter: Payer: Self-pay | Admitting: *Deleted

## 2012-10-04 ENCOUNTER — Encounter: Payer: Self-pay | Admitting: Family Medicine

## 2012-10-04 ENCOUNTER — Ambulatory Visit (INDEPENDENT_AMBULATORY_CARE_PROVIDER_SITE_OTHER): Payer: BC Managed Care – PPO | Admitting: Family Medicine

## 2012-10-04 VITALS — BP 122/74 | HR 79 | Temp 98.7°F | Wt 281.8 lb

## 2012-10-04 DIAGNOSIS — J069 Acute upper respiratory infection, unspecified: Secondary | ICD-10-CM

## 2012-10-04 MED ORDER — BENZONATATE 200 MG PO CAPS: 200.0000 mg | ORAL_CAPSULE | Freq: Three times a day (TID) | ORAL | Status: AC | PRN

## 2012-10-04 NOTE — Patient Instructions (Signed)
Take the tessalon as needed for cough. Drink plenty of fluids, take tylenol as needed, and gargle with warm salt water for your throat.  This should gradually improve.  Take care.  Let us know if you have other concerns.

## 2012-10-04 NOTE — Assessment & Plan Note (Signed)
Nontoxic, unlikely to be flu.  Would likely not need tamiflu now if he did have flu.  D/wpt.  Likely viral, esp given the exam.  Tessalon prn.  Supportive tx o/w.  F/u prn.  Pt agrees.

## 2012-10-04 NOTE — Progress Notes (Signed)
Illness started about 1 week ago.  Post nasal gtt and ear were popping. Now with ears feeling stopped, more stuffy.  Cough from post nasal gtt.  He feels slightly off balance. Sleep is disrupted from the cough.  No fevers.  No sputum.  Tongue feels raw.  Had a flu shot.  Taking cough drops.    Father recently with a CVA and pt was at the hospital with him.  Father is now back home.    Meds, vitals, and allergies reviewed.   ROS: See HPI.  Otherwise, noncontributory.  GEN: nad, alert and oriented HEENT: mucous membranes moist, tm w/o erythema, nasal exam w/o erythema, clear discharge noted,  OP with cobblestoning and typical appear isolated small ulcerations typical for viral process noted on the soft palate.   NECK: supple w/o LA CV: rrr.   PULM: ctab, no inc wob EXT: no edema

## 2012-12-14 ENCOUNTER — Telehealth: Payer: Self-pay | Admitting: Family Medicine

## 2012-12-14 MED ORDER — PANTOPRAZOLE SODIUM 40 MG PO TBEC: DELAYED_RELEASE_TABLET | ORAL | Status: DC

## 2012-12-14 NOTE — Telephone Encounter (Signed)
Patient Information:  Caller Name: [place]aki  Phone: [phone]  Patient: Bryan Kirby, Bryan Kirby  Gender: Male  DOB: 05/21/1956  Age: 57 Years  PCP: [name] (Family Practice)  Office Follow Up:  Does the office need to follow up with this patient?: Yes  Instructions For The Office: Patient would like something he can afford for reflux. Nexium became too expensive. prilosec no longer working for patient.  Please contact  RN Note:  Patient would like something he can afford for reflux. Nexium became too expensive. prilosec no longer working for patient.  Please contact  Symptoms  Reason For Call & Symptoms: Patient he is having reflux that is waking him at night . Ongoing for years, but increased in last 6 months.  Treating with OTC omeprazole but it is not longer working. Reflux in throat upon waking  Patient used to be on Nexium but it was too expensive.  Would like to know if there is something stronger than the prilosec?  LOV 08/2012  Reviewed Health History In EMR: Yes  Reviewed Medications In EMR: Yes  Reviewed Allergies In EMR: Yes  Reviewed Surgeries / Procedures: No  Date of Onset of Symptoms: 06/08/2012  Guideline(s) Used:  Abdominal Pain - Upper  Disposition Per Guideline:   See Within 2 Weeks in Office  Reason For Disposition Reached:   Intermittent burning pains radiating into chest or sour taste in mouth  Advice Given:  Diet:  Slowly advance diet from clear liquids to a bland diet.  Avoid alcohol or caffeinated beverages.  Avoid greasy or fatty foods.  Fluids:   Sip clear fluids only (e.g., water, flat soft drinks, or half-strength fruit juice) until the pain is gone for 2 hours. Then slowly return to a regular diet.  Antacid:  If having pain now, try taking an antacid (e.g., Mylanta, Maalox). Dose: 2 tablespoons (30 ml) of liquid by mouth.  Reducing Reflux Symptoms (GERD):  Eat smaller meals and avoid snacks for 2 hours before sleeping. Avoid the following  foods, which tend to aggravate heartburn and stomach problems: fatty/greasy foods, spicy foods, caffeinated beverages, mints, and chocolate.  Call Back If:  Abdominal pain is constant and present for more than 2 hours.  You become worse.  Patient Will Follow Care Advice:  YES

## 2012-12-14 NOTE — Telephone Encounter (Signed)
Call  Very reasonable to do a trial of generic protonix, which should be cheaper than nexium  protonix 40 mg, 1 po 30 mins before breakfast.  Electronically prescribe to the pharmacy of their choice. (May call in if pharmacy does not participate in electronic prescriptions) Call in #30, 11 refills. OR if they prefer a 90 day supply, #90 with 3 refills is OK, too Prescription instructions above

## 2012-12-14 NOTE — Telephone Encounter (Signed)
Advised patient, script sent to [facility].

## 2013-01-03 ENCOUNTER — Other Ambulatory Visit: Payer: Self-pay | Admitting: Orthopaedic Surgery

## 2013-01-03 DIAGNOSIS — M542 Cervicalgia: Secondary | ICD-10-CM

## 2013-01-11 ENCOUNTER — Ambulatory Visit
Admission: RE | Admit: 2013-01-11 | Discharge: 2013-01-11 | Disposition: A | Discharge status: Home / Self Care | Payer: BC Managed Care – PPO | Source: Ambulatory Visit | Attending: Orthopaedic Surgery | Admitting: Orthopaedic Surgery

## 2013-01-11 ENCOUNTER — Other Ambulatory Visit: Payer: BC Managed Care – PPO

## 2013-01-11 DIAGNOSIS — M542 Cervicalgia: Secondary | ICD-10-CM

## 2013-04-08 ENCOUNTER — Telehealth: Payer: Self-pay

## 2013-04-08 ENCOUNTER — Encounter: Payer: Self-pay | Admitting: Family Medicine

## 2013-04-08 ENCOUNTER — Telehealth: Payer: Self-pay | Admitting: Radiology

## 2013-04-08 ENCOUNTER — Ambulatory Visit (INDEPENDENT_AMBULATORY_CARE_PROVIDER_SITE_OTHER): Payer: BC Managed Care – PPO | Admitting: Family Medicine

## 2013-04-08 VITALS — BP 118/80 | HR 79 | Temp 98.0°F | Resp 16 | Ht 71.5 in | Wt 284.0 lb

## 2013-04-08 DIAGNOSIS — M79609 Pain in unspecified limb: Secondary | ICD-10-CM

## 2013-04-08 DIAGNOSIS — S6980XA Other specified injuries of unspecified wrist, hand and finger(s), initial encounter: Secondary | ICD-10-CM

## 2013-04-08 DIAGNOSIS — S6992XA Unspecified injury of left wrist, hand and finger(s), initial encounter: Secondary | ICD-10-CM

## 2013-04-08 DIAGNOSIS — Z23 Encounter for immunization: Secondary | ICD-10-CM

## 2013-04-08 DIAGNOSIS — M79645 Pain in left finger(s): Secondary | ICD-10-CM

## 2013-04-08 DIAGNOSIS — S6990XA Unspecified injury of unspecified wrist, hand and finger(s), initial encounter: Secondary | ICD-10-CM

## 2013-04-08 DIAGNOSIS — S61409A Unspecified open wound of unspecified hand, initial encounter: Secondary | ICD-10-CM

## 2013-04-08 NOTE — Telephone Encounter (Signed)
I called pt back; pt said was seen at [facility] received stitches and there was nerve damage to finger; pt has appt 04/09/13 to see hand specialist. Apologized could not speak with pt when he called and I would send note to Dr Patsy Lager. Pt said that was OK.

## 2013-04-08 NOTE — Progress Notes (Deleted)
  Subjective:    Patient ID: Bryan Kirby, male    DOB: [birth year], 57 y.o.   MRN: [id number]  HPI    Review of Systems     Objective:   Physical Exam        Assessment & Plan:

## 2013-04-08 NOTE — Progress Notes (Signed)
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 He notes that there is no feeling in the thumb aspect of his finger (radial side). He has good range of motion. He did have difficulty controlling the bleeding but it has finally stopped after arrival here. He's unsure of his last tetanus shot.  Objective: No acute distress Half centimeter wound left proximal index finger, radial side which is dry currently with a clot but gaping open. He has numbness on ipsilateral side of his index finger distal to the puncture wound. He does have good range of motion however.  Assessment: Laceration with damage to the digital nerve which may need to be repaired  Plan: Tdap and referral to hand surgeon  Signed, Sheila Oats.D.

## 2013-04-08 NOTE — Telephone Encounter (Signed)
Pt left v/m cut lt hand, thinks needs stitches; pt said was offered appt at 2:30 pm but wanted appt prior to 2:30. Left v/m for pt to cb. It appears pt is at [facility] now.

## 2013-04-08 NOTE — Progress Notes (Signed)
Procedure:  Verbal consent obtained. Local anesthesia with 2% lido.  Left 2nd digit lateral wound closed loosely with 2 SI sutures.  Dsg placed and wound care d/w pt.

## 2013-04-08 NOTE — Telephone Encounter (Signed)
Hand center will see patient in the morning, will you send appropriate information.

## 2013-04-08 NOTE — Telephone Encounter (Signed)
Noted, I am sure they took care of him well at [facility].

## 2013-04-10 ENCOUNTER — Encounter: Payer: Self-pay | Admitting: Physician Assistant

## 2013-04-10 ENCOUNTER — Ambulatory Visit (INDEPENDENT_AMBULATORY_CARE_PROVIDER_SITE_OTHER): Payer: BC Managed Care – PPO | Admitting: Family Medicine

## 2013-04-10 VITALS — BP 108/80 | HR 76 | Temp 97.7°F | Resp 16 | Ht 71.5 in | Wt 281.0 lb

## 2013-04-10 DIAGNOSIS — S61209D Unspecified open wound of unspecified finger without damage to nail, subsequent encounter: Secondary | ICD-10-CM

## 2013-04-10 DIAGNOSIS — Z5189 Encounter for other specified aftercare: Secondary | ICD-10-CM

## 2013-04-10 NOTE — Progress Notes (Signed)
  Subjective:    Patient ID: Bryan Kirby, male    DOB: 1956/08/15, 57 y.o.   MRN: [id number] Patient seen with PA HPI    Review of Systems     Objective:   Physical Exam Finger healing well.  Numb medially       Assessment & Plan:  Discussed with patient and PA.  Agree with management.  Yield of further surgical repair probably low.  Suggested talking with ortho about this.

## 2013-04-10 NOTE — Progress Notes (Signed)
  Subjective:    Patient ID: Bryan Kirby, male    DOB: 12-25-1955, 57 y.o.   MRN: [id number]  HPI 57 year old male presents for recheck of wound on left index finger.  DOI 04/08/13.  Consulted Dr. Mina Marble and instructed to close loosely and follow up with him.  Saw Dr. Mina Marble yesterday and discussed elective surgery for nerve repair to attempt to regain some feeling on distal lateral portion of finger.  He is still considering whether or not he wants to do this.  Is here today because he woke up and his finger was more swollen and had a "purple color" to it.  He is concerned about infection and also bleeding within the wound.  Denies fever, chills, nausea, vomiting, drainage or warmth to the wound.       Review of Systems  Constitutional: Negative for fever and chills.  Gastrointestinal: Negative for nausea and vomiting.  Skin: Positive for wound. Negative for color change.       Objective:   Physical Exam  Constitutional: He is oriented to person, place, and time. He appears well-developed and well-nourished.  HENT:  Head: Normocephalic and atraumatic.  Right Ear: External ear normal.  Left Ear: External ear normal.  Eyes: Conjunctivae are normal.  Neck: Normal range of motion.  Cardiovascular: Normal rate.   Pulmonary/Chest: Effort normal.  Neurological: He is alert and oriented to person, place, and time.  Skin:  Wound on left index finger well healing.  No surrounding erythema, warmth, drainage, or tenderness.  Slight STS but no noteable discoloration of surrounding skin.   Psychiatric: He has a normal mood and affect. His behavior is normal. Judgment and thought content normal.          Assessment & Plan:

## 2013-04-30 ENCOUNTER — Other Ambulatory Visit: Payer: Self-pay | Admitting: Surgery

## 2013-05-29 ENCOUNTER — Ambulatory Visit: Payer: BC Managed Care – PPO | Admitting: Family Medicine

## 2013-05-29 ENCOUNTER — Telehealth: Payer: Self-pay

## 2013-05-29 NOTE — Telephone Encounter (Signed)
Triage Record Num: [id number] Operator: Sula Rumple Patient Name: Bryan Kirby Call Date & Time: 05/28/2013 5:04:18PM Patient Phone: [phone] PCP: [name] Patient Gender: Male PCP Fax : [phone] Patient DOB: [birth year] Practice Name: [facility] Reason for Call: Caller: Kay/Patient; PCP: [name] (Family Practice); CB#: [phone]; Call regarding Hemorroids; Pt calling on 05/28/13 states he has hemorroids since 05/24/13/ paindul and swollen/afebrile/advised per Rectal Symptoms Protocol/See in 24 hrs per Pain in rectum or rectal area and Hemorrhoids that have not been evaluated or self care is not working. Pt has appt 05/29/13 @ 12 Noon Protocol(s) Used: Rectal Symptoms Recommended Outcome per Protocol: See Provider within 72 Hours Reason for Outcome: Pain in rectum or rectal area AND hemorrhoids that have not been evaluated or self care is not working Care Advice: Hemorrhoid Comfort Measures: - Take warm sitz baths 2-3 times a day to decrease discomfort. - Consider nonprescription anti-inflammatory topical treatments (TUCKS, witch hazel, Preparation H, Nupercainal or Anusal HC ointment per label, pharmacist or provider recommendations). - Gently clean rectal area after bowel movement; avoid harsh cleansing soap or medications. - Use TUCKS, moist towelettes, or toilet tissue moistened with witch hazel to clean after bowel movement. - May apply cloth-covered ice pack to anal area for 10-15 minutes to relieve swelling. - Avoid prolonged sitting. Use donut cushion as needed for sitting (available in most drug stores). - Avoid heavy lifting. - Call provider if having rectal bleeding; swollen, hard hemorrhoids, or severe rectal pain not responding to home care. ~ 08/

## 2013-06-05 ENCOUNTER — Encounter: Payer: Self-pay | Admitting: Family Medicine

## 2013-06-05 ENCOUNTER — Ambulatory Visit (INDEPENDENT_AMBULATORY_CARE_PROVIDER_SITE_OTHER): Payer: BC Managed Care – PPO | Admitting: Family Medicine

## 2013-06-05 VITALS — BP 116/62 | HR 82 | Temp 97.8°F | Ht 71.5 in | Wt 291.5 lb

## 2013-06-05 DIAGNOSIS — L237 Allergic contact dermatitis due to plants, except food: Secondary | ICD-10-CM

## 2013-06-05 DIAGNOSIS — F329 Major depressive disorder, single episode, unspecified: Secondary | ICD-10-CM

## 2013-06-05 DIAGNOSIS — K649 Unspecified hemorrhoids: Secondary | ICD-10-CM

## 2013-06-05 DIAGNOSIS — L255 Unspecified contact dermatitis due to plants, except food: Secondary | ICD-10-CM

## 2013-06-05 MED ORDER — BUPROPION HCL ER (XL) 150 MG PO TB24: 150.0000 mg | ORAL_TABLET | Freq: Every day | ORAL | Status: DC

## 2013-06-05 MED ORDER — CLOBETASOL PROPIONATE 0.05 % EX OINT: TOPICAL_OINTMENT | Freq: Two times a day (BID) | CUTANEOUS | Status: DC

## 2013-06-05 MED ORDER — HYDROCORTISONE ACETATE 25 MG RE SUPP: 25.0000 mg | Freq: Two times a day (BID) | RECTAL | Status: DC | PRN

## 2013-06-05 NOTE — Progress Notes (Signed)
Nature conservation officer at [facility] [address] [place] [address] Phone: [phone] Fax: [phone]  Date:  06/05/2013   Name:  Bryan Kirby   DOB:  1956-09-10   MRN:  [id number] Gender: male Age: 57 y.o.  Primary Physician:  [name], MD  Evaluating MD: [name], MD   Chief Complaint: Poison Oak   History of Present Illness:  Bryan Kirby is a 57 y.o. pleasant patient who presents with the following:  Poison oak: One week history of very itchy. He has been trying multiple planes over-the-counter to  Hemorrhoids, treatment failure with over-the-counter Preparation H, but he did get some suppositories from a family member, which did help.  Anxiety/dep, has been very stressed, this is decompensated. He is on maximal doses of Lexapro denies suicidality or homicidality. More irritable. Arguing with his family and some people at work.  Patient Active Problem List   Diagnosis Date Noted  . URI (upper respiratory infection) 10/04/2012  . FATIGUE 05/24/2010  . OVERWEIGHT 03/04/2008  . DISORDER, DEPRESSIVE NEC 03/16/2007  . HYPERLIPIDEMIA 03/15/2007  . ALLERGIC RHINITIS 03/15/2007  . 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 Allergic rhinitis due to pollen   . Hyperlipidemia     Past Surgical History  Procedure Laterality Date  . Eye surgery    . Polypectomy      OF PENIS  . Cystectomy    . 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 Knee arthroscopy      LEFT LAT MENISCUS TEAR  . Knee surgery      LEFT KNEE CAP WIRING AND PATELLAR REATTACHMENT    History   Social History  . Marital Status: Married    Spouse Name: N/A    Number of Children: 1  . Years of Education: N/A   Occupational History  . PLUMBER    Social History Main Topics  . Smoking status: Former Games developer  . Smokeless tobacco: Never Used  . Alcohol Use: No  . Drug Use: No  . Sexual Activity: Not on file   Other  Topics Concern  . Not on file   Social History Narrative  . No narrative on file    Family History  Problem Relation Age of Onset  . Fibromyalgia Mother   . Depression Mother   . Cancer Father     PROSTATECTOMY  . Stroke Father   . Cancer Sister     CERVICAL  . Stroke Paternal Grandfather   . Hypertension Paternal Grandfather   . Diabetes Paternal Grandfather     Allergies  Allergen Reactions  . Codeine     REACTION: itching  . Penicillins     REACTION: UNSPECIFIED    Medication list has been reviewed and updated.  Outpatient Prescriptions Prior to Visit  Medication Sig Dispense Refill  . aspirin 81 MG tablet Take 81 mg by mouth daily.      Marland Kitchen escitalopram (LEXAPRO) 20 MG tablet Take 1 tablet (20 mg total) by mouth daily.  30 tablet  11  . pantoprazole (PROTONIX) 40 MG tablet Take one by mouth 30 minutes before breakfast.  30 tablet  11  . simvastatin (ZOCOR) 40 MG tablet Take 1 tablet (40 mg total) by mouth at bedtime.  30 tablet  11  . glucosamine-chondroitin 500-400 MG tablet Take 1 tablet by mouth 3 (three) times daily.        Marland Kitchen  omeprazole (PRILOSEC) 20 MG capsule Take 20 mg by mouth daily.         No facility-administered medications prior to visit.    Review of Systems:  As above.   GEN: No acute illnesses, no fevers, chills. GI: No n/v/d, eating normally Pulm: No SOB  Otherwise, ROS is as per the HPI.   Physical Examination: BP 116/62  Pulse 82  Temp(Src) 97.8 F (36.6 C) (Oral)  Ht 5' 11.5" (1.816 m)  Wt 291 lb 8 oz (132.224 kg)  BMI 40.09 kg/m2  Ideal Body Weight: Weight in (lb) to have BMI = 25: 181.4   GEN: WDWN, NAD, Non-toxic, A & O x 3 HEENT: Atraumatic, Normocephalic. Neck supple. No masses, No LAD. Ears and Nose: No external deformity. CV: RRR, No M/G/R. No JVD. No thrill. No extra heart sounds. PULM: CTA B, no wheezes, crackles, rhonchi. No retractions. No resp. distress. No accessory muscle use. EXTR: No c/c/e NEURO Normal gait.    PSYCH: Normally interactive. Conversant. Not depressed or anxious appearing.  Calm demeanor.   Poison ivy dermatitis B arms  Assessment and Plan:  Poison oak  DISORDER, DEPRESSIVE NEC  Hemorrhoids  Add wellbutrin and recheck in 6 weeks  Orders Today:  No orders of the defined types were placed in this encounter.    Updated Medication List: (Includes new medications, updates to list, dose adjustments) Meds ordered this encounter  Medications  . clobetasol ointment (TEMOVATE) 0.05 %    Sig: Apply topically 2 (two) times daily.    Dispense:  60 g    Refill:  0  . hydrocortisone (ANUSOL-HC) 25 MG suppository    Sig: Place 1 suppository (25 mg total) rectally 2 (two) times daily as needed for hemorrhoids.    Dispense:  24 suppository    Refill:  4  . buPROPion (WELLBUTRIN XL) 150 MG 24 hr tablet    Sig: Take 1 tablet (150 mg total) by mouth daily.    Dispense:  30 tablet    Refill:  5    Medications Discontinued: Medications Discontinued During This Encounter  Medication Reason  . omeprazole (PRILOSEC) 20 MG capsule Change in therapy      Signed, Demontae Antunes T. Syniyah Bourne, MD 06/05/2013 9:33 AM

## 2013-06-05 NOTE — Patient Instructions (Addendum)
F/u 1 month 

## 2013-07-08 ENCOUNTER — Ambulatory Visit: Payer: BC Managed Care – PPO | Admitting: Family Medicine

## 2013-07-08 DIAGNOSIS — Z0289 Encounter for other administrative examinations: Secondary | ICD-10-CM

## 2013-08-27 ENCOUNTER — Ambulatory Visit (INDEPENDENT_AMBULATORY_CARE_PROVIDER_SITE_OTHER): Payer: BC Managed Care – PPO | Admitting: Family Medicine

## 2013-08-27 ENCOUNTER — Encounter: Payer: Self-pay | Admitting: Family Medicine

## 2013-08-27 VITALS — BP 118/74 | HR 80 | Temp 98.4°F | Ht 71.5 in | Wt 284.5 lb

## 2013-08-27 DIAGNOSIS — F329 Major depressive disorder, single episode, unspecified: Secondary | ICD-10-CM

## 2013-08-27 DIAGNOSIS — E785 Hyperlipidemia, unspecified: Secondary | ICD-10-CM

## 2013-08-27 DIAGNOSIS — IMO0002 Reserved for concepts with insufficient information to code with codable children: Secondary | ICD-10-CM

## 2013-08-27 DIAGNOSIS — Z23 Encounter for immunization: Secondary | ICD-10-CM

## 2013-08-27 DIAGNOSIS — F324 Major depressive disorder, single episode, in partial remission: Secondary | ICD-10-CM

## 2013-08-27 DIAGNOSIS — E669 Obesity, unspecified: Secondary | ICD-10-CM | POA: Insufficient documentation

## 2013-08-27 DIAGNOSIS — Z79899 Other long term (current) drug therapy: Secondary | ICD-10-CM

## 2013-08-27 DIAGNOSIS — Z125 Encounter for screening for malignant neoplasm of prostate: Secondary | ICD-10-CM

## 2013-08-27 LAB — CBC WITH DIFFERENTIAL/PLATELET
Basophils Relative: 0.5 % (ref 0.0–3.0)
Eosinophils Absolute: 0.1 10*3/uL (ref 0.0–0.7)
Eosinophils Relative: 1.2 % (ref 0.0–5.0)
HCT: 46.4 % (ref 39.0–52.0)
Lymphs Abs: 1.1 10*3/uL (ref 0.7–4.0)
MCHC: 33.6 g/dL (ref 30.0–36.0)
MCV: 94 fl (ref 78.0–100.0)
Monocytes Absolute: 0.6 10*3/uL (ref 0.1–1.0)
Neutro Abs: 4.1 10*3/uL (ref 1.4–7.7)
Neutrophils Relative %: 70 % (ref 43.0–77.0)
RBC: 4.94 Mil/uL (ref 4.22–5.81)
WBC: 5.9 10*3/uL (ref 4.5–10.5)

## 2013-08-27 LAB — BASIC METABOLIC PANEL
CO2: 30 mEq/L (ref 19–32)
Calcium: 9.5 mg/dL (ref 8.4–10.5)
Chloride: 103 mEq/L (ref 96–112)
Creatinine, Ser: 1.1 mg/dL (ref 0.4–1.5)
Sodium: 138 mEq/L (ref 135–145)

## 2013-08-27 LAB — HEPATIC FUNCTION PANEL
ALT: 22 U/L (ref 0–53)
Alkaline Phosphatase: 52 U/L (ref 39–117)
Bilirubin, Direct: 0.1 mg/dL (ref 0.0–0.3)
Total Bilirubin: 0.5 mg/dL (ref 0.3–1.2)
Total Protein: 6.7 g/dL (ref 6.0–8.3)

## 2013-08-27 LAB — LDL CHOLESTEROL, DIRECT: Direct LDL: 92.8 mg/dL

## 2013-08-27 NOTE — Progress Notes (Signed)
Pre-visit discussion using our clinic review tool. No additional management support is needed unless otherwise documented below in the visit note.  

## 2013-08-27 NOTE — Progress Notes (Signed)
Date:  08/27/2013   Name:  Bryan Kirby   DOB:  [birth year]   MRN:  [id number] Gender: male Age: 56 y.o.  Primary Physician:  [name], MD   Chief Complaint: Medication Refill   Subjective:   History of Present Illness:  Bryan Kirby is a 57 y.o. pleasant patient who presents with the following:  Pleasant gentleman here after having some Wellbutrin to his antidepressant regimen. He is feeling quite a bit better, he is having less irritability, and he feels much better overall.  Lipids: Doing well, stable. Tolerating meds fine with no SE. Panel reviewed with patient.  He does need f/u other labs   Patient Active Problem List   Diagnosis Date Noted  . Obesity (BMI 30-39.9) 08/27/2013  . FATIGUE 05/24/2010  . Major depression in partial remission 03/16/2007  . HYPERLIPIDEMIA 03/15/2007  . ALLERGIC RHINITIS 03/15/2007  . 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 Allergic rhinitis due to pollen   . Hyperlipidemia   . Major depression in partial remission 03/16/2007    Qualifier: Diagnosis of  By: Hetty Ely MD, Franne Grip     Past Surgical History  Procedure Laterality Date  . Eye surgery    . Polypectomy      OF PENIS  . Cystectomy    . 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 Knee arthroscopy      LEFT LAT MENISCUS TEAR  . Knee surgery      LEFT KNEE CAP WIRING AND PATELLAR REATTACHMENT    History   Social History  . Marital Status: Married    Spouse Name: N/A    Number of Children: 1  . Years of Education: N/A   Occupational History  . PLUMBER    Social History Main Topics  . Smoking status: Former Games developer  . Smokeless tobacco: Never Used  . Alcohol Use: No  . Drug Use: No  . Sexual Activity: Not on file   Other Topics Concern  . Not on file   Social History Narrative  . No narrative on file    Family History  Problem Relation Age of Onset  . Fibromyalgia Mother   .  Depression Mother   . Cancer Father     PROSTATECTOMY  . Stroke Father   . Cancer Sister     CERVICAL  . Stroke Paternal Grandfather   . Hypertension Paternal Grandfather   . Diabetes Paternal Grandfather     Allergies  Allergen Reactions  . Codeine     REACTION: itching  . Penicillins     REACTION: UNSPECIFIED    Medication list has been reviewed and updated.  Review of Systems:   GEN: No acute illnesses, no fevers, chills. GI: No n/v/d, eating normally Pulm: No SOB Interactive and getting along well at home.  Otherwise, ROS is as per the HPI.  Objective:   Physical Examination: BP 118/74  Pulse 80  Temp(Src) 98.4 F (36.9 C) (Oral)  Ht 5' 11.5" (1.816 m)  Wt 284 lb 8 oz (129.048 kg)  BMI 39.13 kg/m2  Ideal Body Weight: Weight in (lb) to have BMI = 25: 181.4   GEN: WDWN, NAD, Non-toxic, A & O x 3 HEENT: Atraumatic, Normocephalic. Neck supple. No masses, No LAD. Ears and Nose: No external deformity. CV: RRR, No M/G/R. No JVD. No thrill. No extra heart sounds. PULM: CTA B, no wheezes, crackles, rhonchi. No  retractions. No resp. distress. No accessory muscle use. EXTR: No c/c/e NEURO Normal gait.  PSYCH: Normally interactive. Conversant. Not depressed or anxious appearing.  Calm demeanor.   Laboratory and Imaging Data:  Assessment & Plan:    Major depression  Other and unspecified hyperlipidemia - Plan: LDL cholesterol, direct  Special screening for malignant neoplasm of prostate - Plan: PSA  Encounter for long-term (current) use of other medications - Plan: Basic metabolic panel, Hepatic function panel, CBC with Differential  Need for prophylactic vaccination and inoculation against influenza - Plan: Flu Vaccine QUAD 36+ mos PF IM (Fluarix)  Major depression in partial remission  Obesity (BMI 30-39.9)  Depression is doing much better. Check fasting lipid panel and other laboratories as below.  Continue current medications for depression.  Encouraged weight loss.  Patient Instructions    Orders Today:  Orders Placed This Encounter  Procedures  . Flu Vaccine QUAD 36+ mos PF IM (Fluarix)  . Basic metabolic panel  . Hepatic function panel  . PSA  . CBC with Differential  . LDL cholesterol, direct    New medications, updates to list, dose adjustments: Meds ordered this encounter  Medications  . Glucosamine-Chondroitin (GLUCOSAMINE CHONDR COMPLEX) 500-400 MG CAPS    Sig: Take 1 capsule by mouth 3 (three) times daily.    Signed,  Elpidio Galea. Jaslynne Dahan, MD, CAQ Sports Medicine  [facility] [address] [place] [address] Phone: [phone] Fax: [phone]  Updated Complete Medication List:   Medication List       This list is accurate as of: 08/27/13  2:05 PM.  Always use your most recent med list.               aspirin 81 MG tablet  Take 81 mg by mouth daily.     buPROPion 150 MG 24 hr tablet  Commonly known as:  WELLBUTRIN XL  Take 1 tablet (150 mg total) by mouth daily.     escitalopram 20 MG tablet  Commonly known as:  LEXAPRO  Take 1 tablet (20 mg total) by mouth daily.     GLUCOSAMINE CHONDR COMPLEX 500-400 MG Caps  Generic drug:  Glucosamine-Chondroitin  Take 1 capsule by mouth 3 (three) times daily.     pantoprazole 40 MG tablet  Commonly known as:  PROTONIX  Take one by mouth 30 minutes before breakfast.     simvastatin 40 MG tablet  Commonly known as:  ZOCOR  Take 1 tablet (40 mg total) by mouth at bedtime.

## 2013-08-28 ENCOUNTER — Encounter: Payer: Self-pay | Admitting: *Deleted

## 2013-09-03 ENCOUNTER — Other Ambulatory Visit: Payer: Self-pay | Admitting: Family Medicine

## 2013-10-01 ENCOUNTER — Other Ambulatory Visit: Payer: Self-pay | Admitting: Family Medicine

## 2013-12-04 ENCOUNTER — Other Ambulatory Visit: Payer: Self-pay | Admitting: Family Medicine

## 2013-12-18 ENCOUNTER — Ambulatory Visit (INDEPENDENT_AMBULATORY_CARE_PROVIDER_SITE_OTHER): Payer: BC Managed Care – PPO | Admitting: Family Medicine

## 2013-12-18 ENCOUNTER — Encounter: Payer: Self-pay | Admitting: Family Medicine

## 2013-12-18 VITALS — BP 126/84 | HR 73 | Temp 97.7°F | Ht 71.5 in | Wt 286.2 lb

## 2013-12-18 DIAGNOSIS — IMO0002 Reserved for concepts with insufficient information to code with codable children: Secondary | ICD-10-CM

## 2013-12-18 DIAGNOSIS — F324 Major depressive disorder, single episode, in partial remission: Secondary | ICD-10-CM

## 2013-12-18 DIAGNOSIS — G479 Sleep disorder, unspecified: Secondary | ICD-10-CM

## 2013-12-18 DIAGNOSIS — L989 Disorder of the skin and subcutaneous tissue, unspecified: Secondary | ICD-10-CM

## 2013-12-18 DIAGNOSIS — R5383 Other fatigue: Secondary | ICD-10-CM

## 2013-12-18 DIAGNOSIS — R5381 Other malaise: Secondary | ICD-10-CM

## 2013-12-18 NOTE — Patient Instructions (Signed)
Wellbutrin, go to one tablet every other day and then stop.

## 2013-12-18 NOTE — Progress Notes (Signed)
Date:  12/18/2013   Name:  Bryan Kirby   DOB:  01-16-1956   MRN:  [id number] Gender: male Age: 58 y.o.  Primary Physician:  Owens Loffler, MD   Chief Complaint: check place behind left ear   Subjective:   History of Present Illness:  Bryan Kirby is a 58 y.o. very pleasant male patient who presents with the following:  Place on ear.  Glasses scrubbing a place on her left ear. It has healed quite a bit and looking better with gauze over ear tip from glasses and neosporin.  Thinks Wellbutrin, might be dragging him down, feeling more and more depressed and no get up and go at all really. Anxiety has not really changed a lot, just feels worse. No SI or HI, already on Lexapro 20 mg.  ? Always might have a fast temper.  Trying to learn control. Getting frustrated at home. Some yelling, no violence.   ? OSA. Sleep study. Feels tired all the time, feels very sleepy during the day. Snores. Body mass index is 39.37 kg/(m^2).    Past Medical History, Surgical History, Social History, Family History, Problem List, Medications, and Allergies have been reviewed and updated if relevant.  Review of Systems:  GEN: No acute illnesses, no fevers, chills. GI: No n/v/d, eating normally Pulm: No SOB Interactive and getting along well at home.  Otherwise, ROS is as per the HPI.  Objective:   Physical Examination: BP 126/84  Pulse 73  Temp(Src) 97.7 F (36.5 C) (Oral)  Ht 5' 11.5" (1.816 m)  Wt 286 lb 4 oz (129.842 kg)  BMI 39.37 kg/m2  SpO2 93%   GEN: WDWN, NAD, Non-toxic, Alert & Oriented x 3 HEENT: Atraumatic, Normocephalic.  Ears and Nose: No external deformity. EXTR: No clubbing/cyanosis/edema NEURO: Normal gait.  PSYCH: Normally interactive. Conversant. Not depressed or anxious appearing.  Calm demeanor.  Small, healing skin lesion behind left ear.   Laboratory and Imaging Data:  Assessment & Plan:   Sleep disorder - Plan: Ambulatory referral to  Pulmonology  Major depression in partial remission  Skin lesion  FATIGUE  Patient Instructions  Wellbutrin, go to one tablet every other day and then stop.       >25 minutes spent in face to face time with patient, >50% spent in counselling or coordination of care: mostly spent in discussion about depression. Also with likely OSA, will make sleep consult. To f/u with me 1 month post starting CPAP. (if needed)  Reassured about skin lesion, follow, if gets bigger would biopsy, but likely ok since improving without agitation.   Orders Placed This Encounter  Procedures  . Ambulatory referral to Pulmonology   Patient's Medications  New Prescriptions   No medications on file  Previous Medications   ASPIRIN 81 MG TABLET    Take 81 mg by mouth daily.   BUPROPION (WELLBUTRIN XL) 150 MG 24 HR TABLET    TAKE 1 TABLET BY MOUTH DAILY   ESCITALOPRAM (LEXAPRO) 20 MG TABLET    TAKE 1 TABLET BY MOUTH DAILY   FLUORURACIL (CARAC) 0.5 % CREAM    Apply topically daily as needed.   GLUCOSAMINE-CHONDROITIN (GLUCOSAMINE CHONDR COMPLEX) 500-400 MG CAPS    Take 1 capsule by mouth 3 (three) times daily.   PANTOPRAZOLE (PROTONIX) 40 MG TABLET    Take one by mouth 30 minutes before breakfast.   SIMVASTATIN (ZOCOR) 40 MG TABLET    TAKE ONE TABLET BY MOUTH EVERY NIGHT AT BEDTIME  Modified  Medications   No medications on file  Discontinued Medications   No medications on file   Patient Instructions  Wellbutrin, go to one tablet every other day and then stop.      Signed,  Maud Deed. Calirose Mccance, MD, [place] at [facility] [place] [address] Phone: [phone] Fax: [phone]

## 2013-12-18 NOTE — Progress Notes (Signed)
Pre visit review using our clinic review tool, if applicable. No additional management support is needed unless otherwise documented below in the visit note. 

## 2013-12-20 ENCOUNTER — Ambulatory Visit (INDEPENDENT_AMBULATORY_CARE_PROVIDER_SITE_OTHER): Payer: BC Managed Care – PPO | Admitting: Pulmonary Disease

## 2013-12-20 ENCOUNTER — Encounter: Payer: Self-pay | Admitting: Pulmonary Disease

## 2013-12-20 VITALS — BP 118/78 | HR 71 | Temp 97.7°F | Ht 71.5 in | Wt 286.6 lb

## 2013-12-20 DIAGNOSIS — G4733 Obstructive sleep apnea (adult) (pediatric): Secondary | ICD-10-CM | POA: Insufficient documentation

## 2013-12-20 NOTE — Patient Instructions (Signed)
Schedule sleep study

## 2013-12-20 NOTE — Progress Notes (Signed)
Subjective:    Patient ID: Bryan Kirby, male    DOB: 1955-11-04, 58 y.o.   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 Epworth sleepiness score is 18 oh 24 and a report sleepiness while sitting and reading , on the computer,  As a passenger in the car and in social situations such as church. Wife has noted loud snoring and witnessed apneas. Bedtime is around 10-12 midnight, sleep latency is 15 minutes, he sleeps on his right side with 2 pillows, but denies frequent nocturnal awakenings or nocturia and is out of bed around 6 AM feeling tired with dryness of mouth. He is overweight but denies recent weight gain. He drinks 2 large cups of coffee in the morning and several cups of iced tea during the day.  There is no history suggestive of cataplexy, sleep paralysis or parasomnias He has a history of prior RLE DVT in 2000 after knee surgery  Past Medical History  Diagnosis Date  . Allergic rhinitis due to pollen   . Hyperlipidemia   . 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 DVT (deep venous thrombosis) 2000    Right leg    Past Surgical History  Procedure Laterality Date  . Eye surgery    . Polypectomy      OF PENIS  . Cystectomy    . 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 Knee arthroscopy      LEFT LAT MENISCUS TEAR  . Knee surgery      LEFT KNEE CAP WIRING AND PATELLAR REATTACHMENT    Allergies  Allergen Reactions  . Codeine     REACTION: itching  . Penicillins     REACTION: UNSPECIFIED    History   Social History  . Marital Status: Married    Spouse Name: N/A    Number of Children: 1  . Years of Education: N/A   Occupational History  . PLUMBER    Social History Main Topics  . Smoking status: Former Smoker -- 0.50 packs/day for 1 years    Types: Cigarettes    Quit date: 10/03/1973  . Smokeless tobacco: Never Used  .  Alcohol Use: No  . Drug Use: No  . Sexual Activity: Not on file   Other Topics Concern  . Not on file   Social History Narrative  . No narrative on file     Review of Systems Constitutional: negative for anorexia, fevers and sweats  Eyes: negative for irritation, redness and visual disturbance  Ears, nose, mouth, throat, and face: negative for earaches, epistaxis, nasal congestion and sore throat  Respiratory: negative for cough, dyspnea on exertion, sputum and wheezing  Cardiovascular: negative for chest pain, dyspnea, lower extremity edema, orthopnea, palpitations and syncope  Gastrointestinal: negative for abdominal pain, constipation, diarrhea, melena, nausea and vomiting  Genitourinary:negative for dysuria, frequency and hematuria  Hematologic/lymphatic: negative for bleeding, easy bruising and lymphadenopathy  Musculoskeletal:negative for arthralgias, muscle weakness and stiff joints  Neurological: negative for coordination problems, gait problems, headaches and weakness  Endocrine: negative for diabetic symptoms including polydipsia, polyuria and weight loss     Objective:   Physical Exam  Gen. Pleasant, obese, in no distress, normal affect ENT - no lesions, no post nasal drip, class 2-3 airway Neck: No JVD, no thyromegaly, no carotid bruits Lungs: no use of accessory muscles, no dullness to  percussion, decreased without rales or rhonchi  Cardiovascular: Rhythm regular, heart sounds  normal, no murmurs or gallops, no peripheral edema Abdomen: soft and non-tender, no hepatosplenomegaly, BS normal. Musculoskeletal: No deformities, no cyanosis or clubbing Neuro:  alert, non focal, no tremors        Assessment & Plan:

## 2013-12-20 NOTE — Assessment & Plan Note (Signed)
Given excessive daytime somnolence, narrow pharyngeal exam, witnessed apneas & loud snoring, obstructive sleep apnea is very likely & an overnight polysomnogram will be scheduled as a split study. The pathophysiology of obstructive sleep apnea , it's cardiovascular consequences & modes of treatment including CPAP were discussed with the patient in detail & they evidenced understanding.

## 2014-01-02 ENCOUNTER — Other Ambulatory Visit: Payer: Self-pay | Admitting: Family Medicine

## 2014-01-24 ENCOUNTER — Encounter (HOSPITAL_BASED_OUTPATIENT_CLINIC_OR_DEPARTMENT_OTHER): Payer: BC Managed Care – PPO

## 2014-01-31 ENCOUNTER — Ambulatory Visit (HOSPITAL_BASED_OUTPATIENT_CLINIC_OR_DEPARTMENT_OTHER): Payer: BC Managed Care – PPO | Attending: Pulmonary Disease | Admitting: Radiology

## 2014-01-31 VITALS — Ht 71.5 in | Wt 285.0 lb

## 2014-01-31 DIAGNOSIS — G4761 Periodic limb movement disorder: Secondary | ICD-10-CM | POA: Insufficient documentation

## 2014-01-31 DIAGNOSIS — G4733 Obstructive sleep apnea (adult) (pediatric): Secondary | ICD-10-CM | POA: Insufficient documentation

## 2014-02-10 ENCOUNTER — Telehealth: Payer: Self-pay | Admitting: Pulmonary Disease

## 2014-02-10 DIAGNOSIS — G4730 Sleep apnea, unspecified: Secondary | ICD-10-CM

## 2014-02-10 DIAGNOSIS — G4733 Obstructive sleep apnea (adult) (pediatric): Secondary | ICD-10-CM

## 2014-02-10 DIAGNOSIS — G4710 Hypersomnia, unspecified: Secondary | ICD-10-CM

## 2014-02-10 NOTE — Sleep Study (Signed)
Bryan Kirby  NAME: Bryan Kirby  DATE OF BIRTH: [birth year]  MEDICAL RECORD NUMBER [id number]  LOCATION: [facility]  PHYSICIAN: Jazzlene Huot V.  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 Wife has noted loud snoring and witnessed apneas.   At the time of this study ,they weighed 285 pounds with a height of  5 ft 11 inches and the BMI of 39, neck size of 20 inches. Epworth sleepiness score was 17   This intervention polysomnogram was performed with a sleep technologist in attendance. EEG, EOG,EMG and respiratory parameters recorded. Sleep stages, arousals, limb movements and respiratory data was scored according to criteria laid out by the American Academy of sleep medicine.  SLEEP ARCHITECTURE: Lights out was at 2307 PM and lights on was at 504 AM. CPAP was initiated at 1:48 AM .During the baseline portion ,total sleep time was 120 minutes with sleep latency of 41 minutes with latency to REM sleep of 110 minutes and wake after sleep onset of 1.5 minutes. Sleep efficiency was poor at 74% . Sleep stages as a percentage of total sleep time was N1 -4 %,N2- 81 % and REM sleep 4% ( 4.5 minutes) . During titration REM sleep accounted for 103 minutes and supine sleep was noted .The longest period of REM sleep was around 3:30 AM.   AROUSAL DATA : At baseline ,there were 44 arousals with an arousal index of 22 events per hour. Of these 13 were spontaneous, and 29 were associated with respiratory events and 2 were associated periodic limb movements. During titration, the arousal index was 5  events per hour  RESPIRATORY DATA: During the baseline portion,there were 2 obstructive apneas, 0 central apneas, 0 mixed apneas and 52 hypopneas with apnea -hypopnea index of 27 events per hour.  There was  no relation to sleep stage or body position. Due to this degree of respiratory disturbance CPAP was initiated at 5 cm and titrated to final level of 8 cm due to persistent events during supine sleep. At the level of 8 cm for 86 minutes of sleep, 0 apneas and 1 hypopneas is were noted. Titration was optimal   MOVEMENT/PARASOMNIA: There were 21 PLMS with a PLM index of 10.5 events per hour. The PLM arousal index was 1 events per hour. PLMS seemed to improve during titration.  OXYGEN DATA: The lowest desaturation was 84 % during non-REM sleep and the desaturation index was 28 per hour. This was corrected by titration and the saturations stayed below 88% for 2 minutes during titration.  CARDIAC DATA: The low heart rate was 61 beats per minute. The high heart rate recorded was an artifact. No arrhythmias were noted   IMPRESSION :  1. severe obstructive sleep apnea with predominant  hypopneas causing sleep fragmentation and moderate oxygen desaturation. 2. This was corrected by CPAP of 8 centimeters with medium fullface mask. Titration was optimal 3. No evidence of cardiac arrhythmias or behavioral disturbance during sleep. Periodic limb movements were corrected by CPAP  RECOMMENDATION:    1. The treatment options for this degree of sleep disordered breathing includes weight loss and CPAP therapy. 2. CPAP can be initiated at 8 centimeters with a medium fullface mask and compliance monitored at this level. 3. Patient should be cautioned  against driving when sleepy.They should be asked to avoid medications with sedative side effects  Rigoberto Noel. M.D. Diplomate, Tax adviser of Sleep Medicine  ELECTRONICALLY SIGNED ON:  02/10/14 [facility] PH: [phone]   FX: [phone] [place]

## 2014-02-10 NOTE — Telephone Encounter (Signed)
I spoke with patient about results and he verbalized understanding and had no questions He will call once he is set up.

## 2014-02-10 NOTE — Telephone Encounter (Signed)
Severe, AHI 27/h CPAP can be initiated at 8 centimeters with a medium fullface mask  Rx sent to DME FU in 1 mnth

## 2014-03-10 ENCOUNTER — Other Ambulatory Visit: Payer: Self-pay | Admitting: Family Medicine

## 2014-03-31 ENCOUNTER — Telehealth: Payer: Self-pay | Admitting: *Deleted

## 2014-03-31 NOTE — Telephone Encounter (Signed)
Pt also needs ROV w/ RA

## 2014-03-31 NOTE — Telephone Encounter (Signed)
Good usage cpap 8 cm very effective

## 2014-04-01 NOTE — Telephone Encounter (Signed)
I spoke with patient about results and he verbalized understanding and had no questions appt scheduled

## 2014-04-07 ENCOUNTER — Ambulatory Visit: Payer: BC Managed Care – PPO | Admitting: Pulmonary Disease

## 2014-05-15 ENCOUNTER — Other Ambulatory Visit: Payer: Self-pay | Admitting: Dermatology

## 2014-05-20 ENCOUNTER — Ambulatory Visit: Payer: BC Managed Care – PPO | Admitting: Pulmonary Disease

## 2014-06-02 ENCOUNTER — Encounter: Payer: Self-pay | Admitting: Gastroenterology

## 2014-06-10 ENCOUNTER — Other Ambulatory Visit: Payer: Self-pay | Admitting: Family Medicine

## 2014-06-30 ENCOUNTER — Ambulatory Visit (INDEPENDENT_AMBULATORY_CARE_PROVIDER_SITE_OTHER): Payer: BC Managed Care – PPO

## 2014-06-30 ENCOUNTER — Encounter: Payer: Self-pay | Admitting: Podiatry

## 2014-06-30 ENCOUNTER — Ambulatory Visit (INDEPENDENT_AMBULATORY_CARE_PROVIDER_SITE_OTHER): Payer: BC Managed Care – PPO | Admitting: Podiatry

## 2014-06-30 VITALS — Resp 16 | Ht 71.0 in | Wt 285.0 lb

## 2014-06-30 DIAGNOSIS — M722 Plantar fascial fibromatosis: Secondary | ICD-10-CM

## 2014-06-30 DIAGNOSIS — L600 Ingrowing nail: Secondary | ICD-10-CM

## 2014-06-30 MED ORDER — TRIAMCINOLONE ACETONIDE 10 MG/ML IJ SUSP
10.0000 mg | Freq: Once | INTRAMUSCULAR | Status: AC
  Administered 2014-06-30: 10 mg

## 2014-07-01 ENCOUNTER — Ambulatory Visit (INDEPENDENT_AMBULATORY_CARE_PROVIDER_SITE_OTHER): Payer: BC Managed Care – PPO | Admitting: Pulmonary Disease

## 2014-07-01 ENCOUNTER — Encounter: Payer: Self-pay | Admitting: Pulmonary Disease

## 2014-07-01 VITALS — BP 142/88 | HR 73 | Temp 97.3°F | Ht 70.0 in | Wt 287.8 lb

## 2014-07-01 DIAGNOSIS — G4733 Obstructive sleep apnea (adult) (pediatric): Secondary | ICD-10-CM

## 2014-07-01 DIAGNOSIS — Z23 Encounter for immunization: Secondary | ICD-10-CM

## 2014-07-01 NOTE — Patient Instructions (Signed)
Your CPAP is set at 8 cm Weight loss encouraged, compliance  of at least 6 hrs every night is the expectation. Advised against medications with sedative side effects Cautioned against driving when sleepy - understanding that sleepiness will vary on a day to day basis

## 2014-07-01 NOTE — Progress Notes (Signed)
   Subjective:    Patient ID: Bryan Kirby, male    DOB: 18-Oct-1955, 58 y.o.   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   He has a history of prior RLE DVT in 2000 after knee surgery  Chief Complaint  Patient presents with  . Sleep Apnea    Wears CPAP every night even when napping; no problem sleeping; dreaming, never dreamed before; well rested, not as tired during the day    PSG - Severe, AHI 27/h  CPAP  initiated at 8 centimeters with a medium fullface mask  download 02/14/14-03/15/14 Good usage  cpap 8 cm very effective Dreaming for the first time, well rested, more energy, no snoring per wife Occ naps in recliner but otherwise uses CPAP every time   Review of Systems neg for any significant sore throat, dysphagia, itching, sneezing, nasal congestion or excess/ purulent secretions, fever, chills, sweats, unintended wt loss, pleuritic or exertional cp, hempoptysis, orthopnea pnd or change in chronic leg swelling. Also denies presyncope, palpitations, heartburn, abdominal pain, nausea, vomiting, diarrhea or change in bowel or urinary habits, dysuria,hematuria, rash, arthralgias, visual complaints, headache, numbness weakness or ataxia.     Objective:   Physical Exam  Gen. Pleasant, obese, in no distress ENT - no lesions, no post nasal drip Neck: No JVD, no thyromegaly, no carotid bruits Lungs: no use of accessory muscles, no dullness to percussion, decreased without rales or rhonchi  Cardiovascular: Rhythm regular, heart sounds  normal, no murmurs or gallops, no peripheral edema Musculoskeletal: No deformities, no cyanosis or clubbing , no tremors       Assessment & Plan:

## 2014-07-01 NOTE — Assessment & Plan Note (Signed)
Your CPAP is set at 8 cm Weight loss encouraged, compliance  of at least 6 hrs every night is the expectation. Advised against medications with sedative side effects Cautioned against driving when sleepy - understanding that sleepiness will vary on a day to day basis

## 2014-07-02 NOTE — Progress Notes (Signed)
Subjective:     Patient ID: Bryan Kirby, male   DOB: Sep 27, 1956, 57 y.o.   MRN: [id number]  HPI patient states that my heel hurts and I been having some trouble with my ingrown toenail and wanted to get it checked   Review of Systems     Objective:   Physical Exam Neurovascular status was found to be intact with muscle strength adequate and exquisite discomfort in the plantar aspect of the left heel at the insertion of the tendon into the calcaneus. Patient is noted to have incurvation of the nailbed hallux of a moderate nature    Assessment:     Plantar fasciitis with inflammation and also ingrown toenail deformity    Plan:     Reviewed both conditions and today injected the plantar fascia 3 mg Kenalog 5 mg Xylocaine and scanned for new orthotics do to 2 years of not having. Patient will be seen back when they are ready and at this time we'll begin physical therapy and supportive shoe gear usage

## 2014-07-09 ENCOUNTER — Other Ambulatory Visit: Payer: Self-pay | Admitting: Family Medicine

## 2014-07-25 ENCOUNTER — Ambulatory Visit (INDEPENDENT_AMBULATORY_CARE_PROVIDER_SITE_OTHER): Payer: BC Managed Care – PPO

## 2014-07-25 DIAGNOSIS — M722 Plantar fascial fibromatosis: Secondary | ICD-10-CM

## 2014-07-25 NOTE — Progress Notes (Signed)
Pt is here to PUO 

## 2014-07-25 NOTE — Patient Instructions (Signed)

## 2014-09-09 ENCOUNTER — Other Ambulatory Visit: Payer: Self-pay | Admitting: Family Medicine

## 2014-09-09 NOTE — Telephone Encounter (Signed)
Last office visit 12/18/2013.  Ok to refill?

## 2014-09-29 ENCOUNTER — Telehealth: Payer: Self-pay | Admitting: *Deleted

## 2014-09-29 ENCOUNTER — Encounter (HOSPITAL_COMMUNITY): Payer: Self-pay | Admitting: Emergency Medicine

## 2014-09-29 ENCOUNTER — Emergency Department (HOSPITAL_COMMUNITY): Payer: BC Managed Care – PPO

## 2014-09-29 ENCOUNTER — Inpatient Hospital Stay (HOSPITAL_COMMUNITY)
Admission: EM | Admit: 2014-09-29 | Discharge: 2014-10-02 | DRG: 287 | Disposition: A | Discharge status: Home / Self Care | Payer: BC Managed Care – PPO | Attending: Internal Medicine | Admitting: Internal Medicine

## 2014-09-29 DIAGNOSIS — R9439 Abnormal result of other cardiovascular function study: Secondary | ICD-10-CM | POA: Diagnosis present

## 2014-09-29 DIAGNOSIS — F324 Major depressive disorder, single episode, in partial remission: Secondary | ICD-10-CM | POA: Diagnosis present

## 2014-09-29 DIAGNOSIS — R911 Solitary pulmonary nodule: Secondary | ICD-10-CM | POA: Diagnosis present

## 2014-09-29 DIAGNOSIS — R0789 Other chest pain: Secondary | ICD-10-CM | POA: Diagnosis present

## 2014-09-29 DIAGNOSIS — Z880 Allergy status to penicillin: Secondary | ICD-10-CM

## 2014-09-29 DIAGNOSIS — E785 Hyperlipidemia, unspecified: Secondary | ICD-10-CM | POA: Diagnosis present

## 2014-09-29 DIAGNOSIS — Z6839 Body mass index (BMI) 39.0-39.9, adult: Secondary | ICD-10-CM

## 2014-09-29 DIAGNOSIS — R079 Chest pain, unspecified: Secondary | ICD-10-CM | POA: Insufficient documentation

## 2014-09-29 DIAGNOSIS — Z823 Family history of stroke: Secondary | ICD-10-CM

## 2014-09-29 DIAGNOSIS — R931 Abnormal findings on diagnostic imaging of heart and coronary circulation: Secondary | ICD-10-CM | POA: Insufficient documentation

## 2014-09-29 DIAGNOSIS — R0602 Shortness of breath: Secondary | ICD-10-CM | POA: Diagnosis present

## 2014-09-29 DIAGNOSIS — K219 Gastro-esophageal reflux disease without esophagitis: Secondary | ICD-10-CM | POA: Diagnosis present

## 2014-09-29 DIAGNOSIS — J309 Allergic rhinitis, unspecified: Secondary | ICD-10-CM | POA: Diagnosis present

## 2014-09-29 DIAGNOSIS — G4733 Obstructive sleep apnea (adult) (pediatric): Secondary | ICD-10-CM | POA: Diagnosis present

## 2014-09-29 DIAGNOSIS — F329 Major depressive disorder, single episode, unspecified: Secondary | ICD-10-CM | POA: Diagnosis present

## 2014-09-29 DIAGNOSIS — Z888 Allergy status to other drugs, medicaments and biological substances status: Secondary | ICD-10-CM

## 2014-09-29 DIAGNOSIS — Z86718 Personal history of other venous thrombosis and embolism: Secondary | ICD-10-CM

## 2014-09-29 DIAGNOSIS — Z809 Family history of malignant neoplasm, unspecified: Secondary | ICD-10-CM

## 2014-09-29 DIAGNOSIS — Z7982 Long term (current) use of aspirin: Secondary | ICD-10-CM

## 2014-09-29 DIAGNOSIS — Z87891 Personal history of nicotine dependence: Secondary | ICD-10-CM

## 2014-09-29 HISTORY — DX: Gastro-esophageal reflux disease without esophagitis: K21.9

## 2014-09-29 HISTORY — DX: Family history of other specified conditions: Z84.89

## 2014-09-29 HISTORY — DX: Unspecified osteoarthritis, unspecified site: M19.90

## 2014-09-29 HISTORY — DX: Sleep apnea, unspecified: G47.30

## 2014-09-29 LAB — BASIC METABOLIC PANEL
Anion gap: 12 (ref 5–15)
BUN: 13 mg/dL (ref 6–23)
CALCIUM: 9.2 mg/dL (ref 8.4–10.5)
CO2: 20 mmol/L (ref 19–32)
CREATININE: 1.13 mg/dL (ref 0.50–1.35)
Chloride: 107 mEq/L (ref 96–112)
GFR calc Af Amer: 82 mL/min — ABNORMAL LOW (ref >90)
GFR, EST NON AFRICAN AMERICAN: 71 mL/min — AB (ref >90)
GLUCOSE: 114 mg/dL — AB (ref 70–99)
Potassium: 4.1 mmol/L (ref 3.5–5.1)
Sodium: 139 mmol/L (ref 135–145)

## 2014-09-29 LAB — CBC WITH DIFFERENTIAL/PLATELET
Basophils Absolute: 0 10*3/uL (ref 0.0–0.1)
Basophils Relative: 0 % (ref 0–1)
EOS PCT: 2 % (ref 0–5)
Eosinophils Absolute: 0.1 10*3/uL (ref 0.0–0.7)
HCT: 46.8 % (ref 39.0–52.0)
Hemoglobin: 15.7 g/dL (ref 13.0–17.0)
LYMPHS ABS: 1.5 10*3/uL (ref 0.7–4.0)
LYMPHS PCT: 21 % (ref 12–46)
MCH: 31.8 pg (ref 26.0–34.0)
MCHC: 33.5 g/dL (ref 30.0–36.0)
MCV: 94.9 fL (ref 78.0–100.0)
Monocytes Absolute: 0.7 10*3/uL (ref 0.1–1.0)
Monocytes Relative: 9 % (ref 3–12)
Neutro Abs: 5 10*3/uL (ref 1.7–7.7)
Neutrophils Relative %: 68 % (ref 43–77)
Platelets: 222 10*3/uL (ref 150–400)
RBC: 4.93 MIL/uL (ref 4.22–5.81)
RDW: 13.4 % (ref 11.5–15.5)
WBC: 7.3 10*3/uL (ref 4.0–10.5)

## 2014-09-29 LAB — I-STAT TROPONIN, ED: Troponin i, poc: 0 ng/mL (ref 0.00–0.08)

## 2014-09-29 LAB — PROTIME-INR
INR: 0.96 (ref 0.00–1.49)
PROTHROMBIN TIME: 12.9 s (ref 11.6–15.2)

## 2014-09-29 LAB — BRAIN NATRIURETIC PEPTIDE: B Natriuretic Peptide: 19.6 pg/mL (ref 0.0–100.0)

## 2014-09-29 NOTE — Telephone Encounter (Signed)
[facility] Patient Name: Bryan Kirby Gender: Male DOB: May 12, 1956 Age: 58 Y 2 M 8 D Return Phone Number: [phone] (Primary) Address: [address] City/State/Zip: [place] [address] Client [facility] - Client Client Site [place] - Day Physician [place], [place] Contact Type Call Call Type Triage / Clinical Relationship To Patient Self Return Phone Number [phone] (Primary) Chief Complaint BREATHING - shortness of breath or sounds breathless Initial Comment Caller States he is having shortness of breath, and labored breathing. he is also on a c-pap PreDisposition Did not know what to do Nurse Assessment Nurse: Orvan Seen, RN, Jacquilin Date/Time (Eastern Time): 09/29/2014 1:40:06 PM Confirm and document reason for call. If symptomatic, describe symptoms. ---Caller States he is having shortness of breath, and labored breathing. he is also on a c-pap at the night. Started about a week ago. Has the patient traveled out of the country within the last 30 days? ---No Does the patient require triage? ---Yes Related visit to physician within the last 2 weeks? ---No Does the PT have any chronic conditions? (i.e. diabetes, asthma, etc.) ---Yes List chronic conditions. ---apnea, DVT in 2001 Guidelines Guideline Title Affirmed Question Affirmed Notes Nurse Date/Time Eilene Ghazi Time) Breathing Difficulty History of prior "blood clot" in leg or lungs (i.e., deep vein thrombosis, pulmonary embolism) Orvan Seen, RN, Jacquilin 09/29/2014 1:40:35 PM Disp. Time Eilene Ghazi Time) Disposition Final User 09/29/2014 1:37:33 PM Send to Urgent Queue Donato Heinz 09/29/2014 1:43:10 PM Go to ED Now Yes Orvan Seen, RN, Jacquilin Caller Understands: Yes Disagree/Comply: Comply PLEASE NOTE: All timestamps contained within this report are represented as  [place] Standard Time. CONFIDENTIALTY NOTICE: This fax transmission is intended only for the addressee. It contains information that is legally privileged, confidential or otherwise protected from use or disclosure. If you are not the intended recipient, you are strictly prohibited from reviewing, disclosing, copying using or disseminating any of this information or taking any action in reliance on or regarding this information. If you have received this fax in error, please notify us immediately by telephone so that we can arrange for its return to [place]. Phone: [phone], Toll-Free: [phone], Fax: [phone] Page: 2 of 2 Call Id: [id number] Care Advice Given Per Guideline GO TO ED NOW: You need to be seen in the Emergency Department. Go to the ER at ___________ [place] now. Drive carefully. NOTE TO TRIAGER - DRIVING: * Another adult should drive. CALL EMS 911 IF: you become worse. CARE ADVICE given per Breathing Difficulty (Adult) guideline. After Care Instructions Given Call Event Type User Date / Time Description Referrals [facility] - ED

## 2014-09-29 NOTE — ED Provider Notes (Signed)
CSN: [id number]     Arrival date & time 09/29/14  1934 History   First MD Initiated Contact with Patient 09/29/14 2146     Chief Complaint  Patient presents with  . Shortness of Breath    The patient has had SOB for a week it is gettring progressivley worse so he decided to come in.  The patient does have a history of blood clots.     (Consider location/radiation/quality/duration/timing/severity/associated sxs/prior Treatment) Patient is a 58 y.o. male presenting with shortness of breath. The history is provided by the patient. No language interpreter was used.  Shortness of Breath Associated symptoms: no abdominal pain, no chest pain, no cough, no fever, no headaches, no neck pain and no vomiting   Bryan Kirby is a 58 y/o M with PMHx of DVT, HLD, DVT in 2001 after knee surgery presenting to the ED with chest tightness and shortness of breath that has been ongoing for the past week with worsening over the past couple of days. Patient reported that the chest tightness is localized to the left side of the chest. Stated that he has been having shortness of breath even with rest, stated that the shortness of breath gets worse with exertion. Patient is concerned secondary to blood clots running in the family. Denied cough, nasal congestion, hemoptysis, leg swelling, travels, jaw pain, leg swelling, nausea, vomiting, diarrhea, abdominal pain, melena, hematochezia, recent surgeries. PCP Dr. Lorelei Pont  Past Medical History  Diagnosis Date  . Allergic rhinitis due to pollen   . Hyperlipidemia   . 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 DVT (deep venous thrombosis) 2000    Right leg   Past Surgical History  Procedure Laterality Date  . Eye surgery    . Polypectomy      OF PENIS  . Cystectomy    . 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 Knee arthroscopy      LEFT LAT MENISCUS TEAR  . Knee surgery      LEFT  KNEE CAP WIRING AND PATELLAR REATTACHMENT   Family History  Problem Relation Age of Onset  . Fibromyalgia Mother   . Depression Mother   . Cancer Father     PROSTATECTOMY  . Stroke Father   . Cancer Sister     CERVICAL  . Stroke Paternal Grandfather   . Hypertension Paternal Grandfather   . Diabetes Paternal Grandfather    History  Substance Use Topics  . Smoking status: Former Smoker -- 0.50 packs/day for 1 years    Types: Cigarettes    Quit date: 10/03/1973  . Smokeless tobacco: Never Used  . Alcohol Use: No    Review of Systems  Constitutional: Negative for fever and chills.  Respiratory: Positive for chest tightness and shortness of breath. Negative for cough.   Cardiovascular: Negative for chest pain.  Gastrointestinal: Negative for nausea, vomiting, abdominal pain and diarrhea.  Musculoskeletal: Negative for back pain and neck pain.  Neurological: Negative for dizziness, weakness and headaches.      Allergies  Codeine and Penicillins  Home Medications   Prior to Admission medications   Medication Sig Start Date End Date Taking? Authorizing Provider  aspirin 81 MG tablet Take 81 mg by mouth daily.   Yes Historical Provider, MD  escitalopram (LEXAPRO) 20 MG tablet TAKE 1 TABLET BY MOUTH DAILY 09/09/14  Yes Owens Loffler, MD  pantoprazole (PROTONIX) 40  MG tablet TAKE ONE TABLET BY MOUTH EACH MORNING 30MINUTES BEFORE BREAKFAST 07/09/14  Yes Spencer Copland, MD  simvastatin (ZOCOR) 40 MG tablet TAKE ONE TABLET BY MOUTH AT BEDTIME 03/10/14  Yes Spencer Copland, MD   BP 117/67 mmHg  Pulse 70  Temp(Src) 97.6 F (36.4 C) (Oral)  Resp 19  Ht 5\' 11"  (1.803 m)  Wt 285 lb (129.275 kg)  BMI 39.77 kg/m2  SpO2 96% Physical Exam  Constitutional: He is oriented to person, place, and time. He appears well-developed and well-nourished. No distress.  HENT:  Head: Normocephalic and atraumatic.  Mouth/Throat: Oropharynx is clear and moist. No oropharyngeal exudate.  Eyes:  Conjunctivae and EOM are normal. Pupils are equal, round, and reactive to light. Right eye exhibits no discharge. Left eye exhibits no discharge.  Neck: Normal range of motion. Neck supple. No tracheal deviation present.  Cardiovascular: Normal rate, regular rhythm and normal heart sounds.  Exam reveals no friction rub.   No murmur heard. Cap refill < 3 seconds Negative swelling or pitting edema noted to the lower extremities bilaterally   Pulmonary/Chest: Effort normal and breath sounds normal. No respiratory distress. He has no wheezes. He has no rales. He exhibits no tenderness.  Musculoskeletal: Normal range of motion.  Lymphadenopathy:    He has no cervical adenopathy.  Neurological: He is alert and oriented to person, place, and time. No cranial nerve deficit. He exhibits normal muscle tone. Coordination normal.  Skin: Skin is warm and dry. No rash noted. He is not diaphoretic. No erythema.  Psychiatric: He has a normal mood and affect. His behavior is normal. Thought content normal.  Nursing note and vitals reviewed.   ED Course  Procedures (including critical care time)  Results for orders placed or performed during the hospital encounter of 09/29/14  CBC with Differential  Result Value Ref Range   WBC 7.3 4.0 - 10.5 K/uL   RBC 4.93 4.22 - 5.81 MIL/uL   Hemoglobin 15.7 13.0 - 17.0 g/dL   HCT 46.8 39.0 - 52.0 %   MCV 94.9 78.0 - 100.0 fL   MCH 31.8 26.0 - 34.0 pg   MCHC 33.5 30.0 - 36.0 g/dL   RDW 13.4 11.5 - 15.5 %   Platelets 222 150 - 400 K/uL   Neutrophils Relative % 68 43 - 77 %   Neutro Abs 5.0 1.7 - 7.7 K/uL   Lymphocytes Relative 21 12 - 46 %   Lymphs Abs 1.5 0.7 - 4.0 K/uL   Monocytes Relative 9 3 - 12 %   Monocytes Absolute 0.7 0.1 - 1.0 K/uL   Eosinophils Relative 2 0 - 5 %   Eosinophils Absolute 0.1 0.0 - 0.7 K/uL   Basophils Relative 0 0 - 1 %   Basophils Absolute 0.0 0.0 - 0.1 K/uL  Basic metabolic panel  Result Value Ref Range   Sodium 139 135 - 145  mmol/L   Potassium 4.1 3.5 - 5.1 mmol/L   Chloride 107 96 - 112 mEq/L   CO2 20 19 - 32 mmol/L   Glucose, Bld 114 (H) 70 - 99 mg/dL   BUN 13 6 - 23 mg/dL   Creatinine, Ser 1.13 0.50 - 1.35 mg/dL   Calcium 9.2 8.4 - 10.5 mg/dL   GFR calc non Af Amer 71 (L) >90 mL/min   GFR calc Af Amer 82 (L) >90 mL/min   Anion gap 12 5 - 15  BNP (order ONLY if patient complains of dyspnea/SOB AND you have documented  it for THIS visit)  Result Value Ref Range   B Natriuretic Peptide 19.6 0.0 - 100.0 pg/mL  Protime-INR (if pt is taking Coumadin)  Result Value Ref Range   Prothrombin Time 12.9 11.6 - 15.2 seconds   INR 0.96 0.00 - 1.49  I-stat troponin, ED (not at [facility])  Result Value Ref Range   Troponin i, poc 0.00 0.00 - 0.08 ng/mL   Comment 3            Labs Review Labs Reviewed  BASIC METABOLIC PANEL - Abnormal; Notable for the following:    Glucose, Bld 114 (*)    GFR calc non Af Amer 71 (*)    GFR calc Af Amer 82 (*)    All other components within normal limits  CBC WITH DIFFERENTIAL  BRAIN NATRIURETIC PEPTIDE  PROTIME-INR  I-STAT TROPOININ, ED    Imaging Review Dg Chest 2 View  09/29/2014   CLINICAL DATA:  Shortness of breath x1 week, history of blood clots  EXAM: CHEST  2 VIEW  COMPARISON:  None.  FINDINGS: Lungs are clear.  No pleural effusion or pneumothorax.  The heart is normal in size.  Mild degenerative changes of the visualized thoracolumbar spine.  IMPRESSION: No evidence of acute cardiopulmonary disease.   Electronically Signed   By: Julian Hy M.D.   On: 09/29/2014 20:54   Ct Angio Chest Pe W/cm &/or Wo Cm  09/30/2014   CLINICAL DATA:  Chest pressure and difficulty taking deep breath.  EXAM: CT ANGIOGRAPHY CHEST WITH CONTRAST  TECHNIQUE: Multidetector CT imaging of the chest was performed using the standard protocol during bolus administration of intravenous contrast. Multiplanar CT image reconstructions and MIPs were obtained to evaluate the vascular anatomy.   CONTRAST:  172mL OMNIPAQUE IOHEXOL 350 MG/ML SOLN  COMPARISON:  None.  FINDINGS: THORACIC INLET/BODY WALL:  No acute abnormality.  MEDIASTINUM:  Normal heart size. No pericardial effusion. No acute vascular abnormality, including pulmonary embolism or aortic dissection. No adenopathy.  LUNG WINDOWS:  No consolidation. No effusion. 4 mm noncalcified pulmonary nodule along the right minor fissure (Image 53). Appearance and location favors a lymph node, but followup recommended based on smoking history.  UPPER ABDOMEN:  No acute findings.  OSSEOUS:  No acute fracture.  No suspicious lytic or blastic lesions.  Review of the MIP images confirms the above findings.  IMPRESSION: 1. Negative for pulmonary embolism or other acute intrathoracic finding. 2. 4 mm pulmonary nodule on the right. Given smoking history, follow-up chest CT at 1 year is recommended. This recommendation follows the consensus statement: Guidelines for Management of Small Pulmonary Nodules Detected on CT Scans: A Statement from the [place] as published in Radiology 2005; 237:395-400.   Electronically Signed   By: Jorje Guild M.D.   On: 09/30/2014 01:44     EKG Interpretation   Date/Time:  Monday September 29 2014 20:14:46 EST Ventricular Rate:  80 PR Interval:  176 QRS Duration: 92 QT Interval:  380 QTC Calculation: 438 R Axis:   -3 Text Interpretation:  Normal sinus rhythm Septal infarct , age  undetermined Abnormal ECG Confirmed by COOK  MD, BRIAN ([id number]) on  09/29/2014 10:37:10 PM       12:28 AM This provider spoke with Dr. Mare Ferrari, Cardiology - discussed case in great detail, labs, imaging, vitals. Physician recommended patient to be admitted to Internal Medicine and that cardiology will consult.   12:59 AM This provider discussed case in great detail with Dr. Blaine Hamper, [facility] -  discussed labs, imaging, vitals, ED course in great detail. Patient to be admitted to Telemetry for cardiac rule out.   MDM    Final diagnoses:  Chest pain, unspecified chest pain type  Shortness of breath    Medications  aspirin tablet 325 mg (not administered)  sodium chloride 0.9 % bolus 250 mL (0 mLs Intravenous Stopped 09/30/14 0128)  iohexol (OMNIPAQUE) 350 MG/ML injection 100 mL (100 mLs Intravenous Contrast Given 09/30/14 0030)     Filed Vitals:   09/29/14 2330 09/29/14 2345 09/30/14 0100 09/30/14 0115  BP: 109/37 113/62 119/61 117/67  Pulse: 72 71 72 70  Temp:      TempSrc:      Resp: 25 23 21 19   Height:      Weight:      SpO2: 94% 94% 96% 96%   EKG noted normal sinus rhythm with heart rate of 80 bpm. I-STAT troponin negative elevation. CBC negative elevated white blood cell count. Hemoglobin 15.7, hematocrit 46.8. BMP unremarkable. BNP negative elevation. PT INR within normal limits. Chest x-ray negative for acute cardiopulmonary disease. CT angiogram of chest negative for acute pulmonary embolism. 4 mm pulmonary nodule noted on the right - CT of chest recommended to be repeated in one year.  Patient presenting to the ED with chest tightness and shortness of breath. Imaging negative for PE and pneumonia. Patient has high risk factors. Patient to be admitted for cardiac rule out. Discussed case with [facility] - to admit to Telemetry. Cardiology to consult. Discussed plan for admission with patient who agrees to plan of care. Patient stable for transfer.   Jamse Mead, PA-C 09/30/14 0245  Nat Christen, MD 09/30/14 1800

## 2014-09-29 NOTE — ED Notes (Signed)
The patient has had SOB for a week it is gettring progressivley worse so he decided to come in.  The patient does have a history of blood clots.  He says he is a little bit more "labored" in his breathing.

## 2014-09-30 ENCOUNTER — Inpatient Hospital Stay (HOSPITAL_COMMUNITY): Payer: BC Managed Care – PPO

## 2014-09-30 ENCOUNTER — Emergency Department (HOSPITAL_COMMUNITY): Payer: BC Managed Care – PPO

## 2014-09-30 ENCOUNTER — Encounter (HOSPITAL_COMMUNITY): Payer: Self-pay | Admitting: General Practice

## 2014-09-30 DIAGNOSIS — R0602 Shortness of breath: Secondary | ICD-10-CM

## 2014-09-30 DIAGNOSIS — R079 Chest pain, unspecified: Secondary | ICD-10-CM

## 2014-09-30 DIAGNOSIS — G4733 Obstructive sleep apnea (adult) (pediatric): Secondary | ICD-10-CM

## 2014-09-30 DIAGNOSIS — R911 Solitary pulmonary nodule: Secondary | ICD-10-CM | POA: Diagnosis present

## 2014-09-30 DIAGNOSIS — R0789 Other chest pain: Secondary | ICD-10-CM | POA: Diagnosis present

## 2014-09-30 LAB — CBC
HCT: 44.9 % (ref 39.0–52.0)
HEMOGLOBIN: 15 g/dL (ref 13.0–17.0)
MCH: 31.4 pg (ref 26.0–34.0)
MCHC: 33.4 g/dL (ref 30.0–36.0)
MCV: 94.1 fL (ref 78.0–100.0)
Platelets: 240 10*3/uL (ref 150–400)
RBC: 4.77 MIL/uL (ref 4.22–5.81)
RDW: 13.4 % (ref 11.5–15.5)
WBC: 5.3 10*3/uL (ref 4.0–10.5)

## 2014-09-30 LAB — COMPREHENSIVE METABOLIC PANEL
ALK PHOS: 52 U/L (ref 39–117)
ALT: 25 U/L (ref 0–53)
ANION GAP: 8 (ref 5–15)
AST: 31 U/L (ref 0–37)
Albumin: 4 g/dL (ref 3.5–5.2)
BUN: 10 mg/dL (ref 6–23)
CALCIUM: 9.1 mg/dL (ref 8.4–10.5)
CO2: 24 mmol/L (ref 19–32)
Chloride: 109 mEq/L (ref 96–112)
Creatinine, Ser: 1.03 mg/dL (ref 0.50–1.35)
GFR calc non Af Amer: 79 mL/min — ABNORMAL LOW (ref >90)
GLUCOSE: 89 mg/dL (ref 70–99)
Potassium: 4.3 mmol/L (ref 3.5–5.1)
Sodium: 141 mmol/L (ref 135–145)
Total Bilirubin: 0.7 mg/dL (ref 0.3–1.2)
Total Protein: 6.3 g/dL (ref 6.0–8.3)

## 2014-09-30 LAB — LIPID PANEL
CHOL/HDL RATIO: 3.7 ratio
CHOLESTEROL: 162 mg/dL (ref 0–200)
HDL: 44 mg/dL (ref >39)
LDL Cholesterol: 99 mg/dL (ref 0–99)
TRIGLYCERIDES: 95 mg/dL (ref <150)
VLDL: 19 mg/dL (ref 0–40)

## 2014-09-30 LAB — RAPID URINE DRUG SCREEN, HOSP PERFORMED
Amphetamines: NOT DETECTED
Barbiturates: NOT DETECTED
Benzodiazepines: NOT DETECTED
Cocaine: NOT DETECTED
OPIATES: NOT DETECTED
TETRAHYDROCANNABINOL: NOT DETECTED

## 2014-09-30 LAB — TROPONIN I: Troponin I: <0.03 ng/mL (ref <0.031)

## 2014-09-30 MED ORDER — SODIUM CHLORIDE 0.9 % IV SOLN: INTRAVENOUS | Status: DC

## 2014-09-30 MED ORDER — HEPARIN SODIUM (PORCINE) 5000 UNIT/ML IJ SOLN
5000.0000 [IU] | Freq: Three times a day (TID) | INTRAMUSCULAR | Status: DC
  Administered 2014-09-30 – 2014-10-02 (×6): 5000 [IU] via SUBCUTANEOUS
  Filled 2014-09-30 (×4): qty 1

## 2014-09-30 MED ORDER — ONDANSETRON HCL 4 MG/2ML IJ SOLN
4.0000 mg | Freq: Four times a day (QID) | INTRAMUSCULAR | Status: DC | PRN
  Administered 2014-09-30: 4 mg via INTRAVENOUS
  Filled 2014-09-30: qty 2

## 2014-09-30 MED ORDER — ASPIRIN 325 MG PO TABS
325.0000 mg | ORAL_TABLET | Freq: Once | ORAL | Status: AC
  Administered 2014-09-30: 325 mg via ORAL
  Filled 2014-09-30: qty 1

## 2014-09-30 MED ORDER — ASPIRIN 325 MG PO TABS: 325.0000 mg | ORAL_TABLET | Freq: Once | ORAL | Status: DC

## 2014-09-30 MED ORDER — SODIUM CHLORIDE 0.9 % IJ SOLN
3.0000 mL | Freq: Two times a day (BID) | INTRAMUSCULAR | Status: DC
  Administered 2014-09-30 – 2014-10-01 (×4): 3 mL via INTRAVENOUS
  Filled 2014-09-30: qty 3

## 2014-09-30 MED ORDER — ASPIRIN EC 81 MG PO TBEC
81.0000 mg | DELAYED_RELEASE_TABLET | Freq: Every day | ORAL | Status: DC
  Administered 2014-09-30 – 2014-10-02 (×3): 81 mg via ORAL
  Filled 2014-09-30 (×3): qty 1

## 2014-09-30 MED ORDER — IOHEXOL 350 MG/ML SOLN
100.0000 mL | Freq: Once | INTRAVENOUS | Status: AC | PRN
  Administered 2014-09-30: 100 mL via INTRAVENOUS

## 2014-09-30 MED ORDER — PANTOPRAZOLE SODIUM 40 MG PO TBEC
40.0000 mg | DELAYED_RELEASE_TABLET | Freq: Every day | ORAL | Status: DC
  Administered 2014-09-30 – 2014-10-02 (×3): 40 mg via ORAL
  Filled 2014-09-30 (×3): qty 1

## 2014-09-30 MED ORDER — ESCITALOPRAM OXALATE 10 MG PO TABS
20.0000 mg | ORAL_TABLET | Freq: Every day | ORAL | Status: DC
  Administered 2014-09-30 – 2014-10-02 (×3): 20 mg via ORAL
  Filled 2014-09-30 (×4): qty 2

## 2014-09-30 MED ORDER — ACETAMINOPHEN 650 MG RE SUPP: 650.0000 mg | Freq: Four times a day (QID) | RECTAL | Status: DC | PRN

## 2014-09-30 MED ORDER — MORPHINE SULFATE 2 MG/ML IJ SOLN: 2.0000 mg | INTRAMUSCULAR | Status: DC | PRN

## 2014-09-30 MED ORDER — TECHNETIUM TC 99M SESTAMIBI GENERIC - CARDIOLITE: 30.0000 | Freq: Once | INTRAVENOUS | Status: AC | PRN

## 2014-09-30 MED ORDER — SODIUM CHLORIDE 0.9 % IV BOLUS (SEPSIS)
250.0000 mL | Freq: Once | INTRAVENOUS | Status: AC
Start: 2014-09-30 — End: 2014-09-30
  Administered 2014-09-30: 250 mL via INTRAVENOUS

## 2014-09-30 MED ORDER — ACETAMINOPHEN 325 MG PO TABS
650.0000 mg | ORAL_TABLET | Freq: Four times a day (QID) | ORAL | Status: DC | PRN
  Administered 2014-10-01: 650 mg via ORAL
  Filled 2014-09-30: qty 2

## 2014-09-30 MED ORDER — SIMVASTATIN 40 MG PO TABS
40.0000 mg | ORAL_TABLET | Freq: Every day | ORAL | Status: DC
  Administered 2014-09-30 – 2014-10-01 (×2): 40 mg via ORAL
  Filled 2014-09-30 (×3): qty 1

## 2014-09-30 NOTE — H&P (Signed)
[facility] History and Physical  Bryan Kirby JKD:[id number] DOB: [birth year] DOA: 09/29/2014  Referring physician: ED physician PCP: Owens Loffler, MD  Specialists:   Chief Complaint: SOB   HPI: Bryan Kirby is a 58 y.o. male with past medical history of hyperlipidemia, depression, GERD, OSA, history of DVT, who presents with shortness of breath.  Patient reports that he has been having shortness of breath in the past 4-5 days. He also has mild chest tightness. His symptoms seems to be exertional. No cough, no fever or chills. No leg edema or tenderness over the calf area.   Patient denies fever, chills, fatigue, headaches, cough, abdominal pain, diarrhea, constipation, dysuria, urgency, frequency, hematuria, skin rashes, joint pain or leg swelling.  In ED, CTA was negative for pulmonary embolism or other acute intrathoracic finding, but showed a 4 mm pulmonary nodule on the right. Given smoking history, follow-up chest CT at 1 year is recommended.  Review of Systems: As presented in the history of presenting illness, rest negative.  Where does patient live?  At home Can patient participate in ADLs? Yes  Allergy:  Allergies  Allergen Reactions  . Codeine     REACTION: itching  . Penicillins     REACTION: UNSPECIFIED    Past Medical History  Diagnosis Date  . Allergic rhinitis due to pollen   . Hyperlipidemia   . 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 DVT (deep venous thrombosis) 2000    Right leg    Past Surgical History  Procedure Laterality Date  . Eye surgery    . Polypectomy      OF PENIS  . Cystectomy    . 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 Knee arthroscopy      LEFT LAT MENISCUS TEAR  . Knee surgery      LEFT KNEE CAP WIRING AND PATELLAR REATTACHMENT    Social History:  reports that he quit smoking about 41 years ago. His smoking use included Cigarettes.  He has a .5 pack-year smoking history. He has never used smokeless tobacco. He reports that he does not drink alcohol or use illicit drugs.  Family History:  Family History  Problem Relation Age of Onset  . Fibromyalgia Mother   . Depression Mother   . Cancer Father     PROSTATECTOMY  . Stroke Father   . Cancer Sister     CERVICAL  . Stroke Paternal Grandfather   . Hypertension Paternal Grandfather   . Diabetes Paternal Grandfather      Prior to Admission medications   Medication Sig Start Date End Date Taking? Authorizing Provider  aspirin 81 MG tablet Take 81 mg by mouth daily.   Yes Historical Provider, MD  escitalopram (LEXAPRO) 20 MG tablet TAKE 1 TABLET BY MOUTH DAILY 09/09/14  Yes Spencer Copland, MD  pantoprazole (PROTONIX) 40 MG tablet TAKE ONE TABLET BY MOUTH EACH MORNING 30MINUTES BEFORE BREAKFAST 07/09/14  Yes Spencer Copland, MD  simvastatin (ZOCOR) 40 MG tablet TAKE ONE TABLET BY MOUTH AT BEDTIME 03/10/14  Yes Owens Loffler, MD    Physical Exam: Filed Vitals:   09/30/14 0215 09/30/14 0230 09/30/14 0245 09/30/14 0344  BP: 115/66 85/72 108/64 103/66  Pulse: 72 66 66 65  Temp:      TempSrc:      Resp: 25 26 23 17   Height:      Weight:  SpO2: 94% 97% 93% 97%   General: Not in acute distress HEENT:       Eyes: PERRL, EOMI, no scleral icterus       ENT: No discharge from the ears and nose, no pharynx injection, no tonsillar enlargement.        Neck: No JVD, no bruit, no mass felt. Cardiac: S1/S2, RRR, No murmurs, No gallops or rubs Pulm: Good air movement bilaterally. Clear to auscultation bilaterally. No rales, wheezing, rhonchi or rubs. Abd: Soft, nondistended, nontender, no rebound pain, no organomegaly, BS present Ext: No edema bilaterally. 2+DP/PT pulse bilaterally Musculoskeletal: No joint deformities, erythema, or stiffness, ROM full Skin: No rashes.  Neuro: Alert and oriented X3, cranial nerves II-XII grossly intact, muscle strength 5/5 in all  extremeties, sensation to light touch intact. Brachial reflex 2+ bilaterally. Knee reflex 1+ bilaterally.  Psych: Patient is not psychotic, no suicidal or hemocidal ideation.  Labs on Admission:  Basic Metabolic Panel:  Recent Labs Lab 09/29/14 2026  NA 139  K 4.1  CL 107  CO2 20  GLUCOSE 114*  BUN 13  CREATININE 1.13  CALCIUM 9.2   Liver Function Tests: No results for input(s): AST, ALT, ALKPHOS, BILITOT, PROT, ALBUMIN in the last 168 hours. No results for input(s): LIPASE, AMYLASE in the last 168 hours. No results for input(s): AMMONIA in the last 168 hours. CBC:  Recent Labs Lab 09/29/14 2026  WBC 7.3  NEUTROABS 5.0  HGB 15.7  HCT 46.8  MCV 94.9  PLT 222   Cardiac Enzymes: No results for input(s): CKTOTAL, CKMB, CKMBINDEX, TROPONINI in the last 168 hours.  BNP (last 3 results) No results for input(s): PROBNP in the last 8760 hours. CBG: No results for input(s): GLUCAP in the last 168 hours.  Radiological Exams on Admission: Dg Chest 2 View  09/29/2014   CLINICAL DATA:  Shortness of breath x1 week, history of blood clots  EXAM: CHEST  2 VIEW  COMPARISON:  None.  FINDINGS: Lungs are clear.  No pleural effusion or pneumothorax.  The heart is normal in size.  Mild degenerative changes of the visualized thoracolumbar spine.  IMPRESSION: No evidence of acute cardiopulmonary disease.   Electronically Signed   By: Julian Hy M.D.   On: 09/29/2014 20:54   Ct Angio Chest Pe W/cm &/or Wo Cm  09/30/2014   CLINICAL DATA:  Chest pressure and difficulty taking deep breath.  EXAM: CT ANGIOGRAPHY CHEST WITH CONTRAST  TECHNIQUE: Multidetector CT imaging of the chest was performed using the standard protocol during bolus administration of intravenous contrast. Multiplanar CT image reconstructions and MIPs were obtained to evaluate the vascular anatomy.  CONTRAST:  153mL OMNIPAQUE IOHEXOL 350 MG/ML SOLN  COMPARISON:  None.  FINDINGS: THORACIC INLET/BODY WALL:  No acute  abnormality.  MEDIASTINUM:  Normal heart size. No pericardial effusion. No acute vascular abnormality, including pulmonary embolism or aortic dissection. No adenopathy.  LUNG WINDOWS:  No consolidation. No effusion. 4 mm noncalcified pulmonary nodule along the right minor fissure (Image 53). Appearance and location favors a lymph node, but followup recommended based on smoking history.  UPPER ABDOMEN:  No acute findings.  OSSEOUS:  No acute fracture.  No suspicious lytic or blastic lesions.  Review of the MIP images confirms the above findings.  IMPRESSION: 1. Negative for pulmonary embolism or other acute intrathoracic finding. 2. 4 mm pulmonary nodule on the right. Given smoking history, follow-up chest CT at 1 year is recommended. This recommendation follows the consensus statement: Guidelines for  Management of Small Pulmonary Nodules Detected on CT Scans: A Statement from the [place] as published in Radiology 2005; 237:395-400.   Electronically Signed   By: Jorje Guild M.D.   On: 09/30/2014 01:44    EKG: Independently reviewed.   Assessment/Plan Principal Problem:   SOB (shortness of breath) Active Problems:   HLD (hyperlipidemia)   Major depression in partial remission   Allergic rhinitis   OSA (obstructive sleep apnea)   Chest tightness   Lung nodule  SOB: Etiology is not clear. Chest x-ray is negative for acute abnormalities. CTA is negative for PE. A potential differential diagnosis is cardiac ischemia given patient has chest tightness, and risk factors including age and hyperlipidemia. Cardiology was consulted by ED.  - Will admit to tele bed, follow cardiology's recommendations as follows: -- NPO after midnight for a nuclear medicine stress test with exercise -- Get fasting lipid panel and hemoglobin A1c -- Cycle cardiac enzymes 3.  -- 2D echo -will start ASA -continue Zocor -When necessary nitroglycerin -EKG in am  Depression: Stable. No suicidal or homicidal  ideations. -Continue Lexapro  OSA: -CPAP  DVT ppx: SQ Heparin    Code Status: Full code Family Communication: None at bed side.     Disposition Plan: Admit to inpatient   Date of Service 09/30/2014    Ivor Costa [facility] Pager [phone]  If 7PM-7AM, please contact night-coverage [email or web address] Password [facility] 09/30/2014, 3:51 AM

## 2014-09-30 NOTE — ED Notes (Signed)
Called [facility] to confirm 12:15 appt for pt.

## 2014-09-30 NOTE — Progress Notes (Signed)
Pt. Has already placed himself on his home CPAP with his FFM from home. Pt. Is tolerating CPAP well at this time without any complications.

## 2014-09-30 NOTE — ED Notes (Signed)
Patient transported to CT 

## 2014-09-30 NOTE — Consult Note (Signed)
CARDIOLOGY CONSULT NOTE  Assessment and Plan:  *Chest Pressure/SOB: [name] is a pleasant 58 year old male with history of hyperlipidemia, prediabetes, obstructive sleep apnea on CPAP, morbid obesity comes to the emergency department with 36-48 hour history of chest pressure that has been persistent since onset. He also endorses symptoms of shortness of breath and dyspnea on exertion over past 4-5 days. His clinical presentation does not necessarily suggest acute coronary syndrome but certainly patient has risk factors of coronary artery disease such as age, prediabetes and hyperlipidemia. Patient is concerned about having heart disease at this age. Him and his wife are requesting further cardiac workup which I believe is fairly reasonable.  we recommend: -- NPO after midnight for a nuclear medicine stress test with exercise -- Get fasting lipid panel and hemoglobin A1c -- Cycle cardiac enzymes 3. Monitor on telemetry. -- Please obtain a transthoracic echocardiogram.  Thank you very much for this very interesting consult. We will continue to follow along.  Chief complaint: chest pressure  HPI:  [name] is a pleasant 58 year old male with history of HLD, OSA on CPAP, morbid obesity comes to the emergency department with shortness of breath and chest pressure. Patient was in his usual state of health until about 5 days ago when he felt like his breathing was more "shallow". He felt like he had to take in a deeper breath to get more air in. The symptoms were more noticeable at rest then with exertional. Since yesterday, patient endorses 5/10 central chest pressure without any radiation. The pressure has been present continuously since the onset and is alleviated by deep inspiration. Patient also endorses mild dyspnea on exertion since yesterday as well. He denies any orthopnea, paroxysmal nocturnal dyspnea or lower extremity edema. He also denies any symptoms suggestive of arrhythmias,  presyncope or syncope.  He denies similar symptoms in the past. He denies any previous cardiac workup.  Cardiac history:  Dyslipidemia  Prediabetes Previous cardiac imaging  EKG on 12/28: NSR   TTE: None  NM Stress test: None  Prior cath:  None  Past Medical History Past Medical History  Diagnosis Date  . Allergic rhinitis due to pollen   . Hyperlipidemia   . 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 DVT (deep venous thrombosis) 2000    Right leg   Allergies: Allergies  Allergen Reactions  . Codeine     REACTION: itching  . Penicillins     REACTION: UNSPECIFIED   Medications:  No current facility-administered medications for this encounter.   Current Outpatient Prescriptions  Medication Sig Dispense Refill  . aspirin 81 MG tablet Take 81 mg by mouth daily.    Marland Kitchen escitalopram (LEXAPRO) 20 MG tablet TAKE 1 TABLET BY MOUTH DAILY 30 tablet 5  . pantoprazole (PROTONIX) 40 MG tablet TAKE ONE TABLET BY MOUTH EACH MORNING 30MINUTES BEFORE BREAKFAST 30 tablet 5  . simvastatin (ZOCOR) 40 MG tablet TAKE ONE TABLET BY MOUTH AT BEDTIME 30 tablet 5   Social History History   Social History  . Marital Status: Married    Spouse Name: N/A    Number of Children: 1  . Years of Education: N/A   Occupational History  . PLUMBER    Social History Main Topics  . Smoking status: Former Smoker -- 0.50 packs/day for 1 years    Types: Cigarettes    Quit date: 10/03/1973  . Smokeless tobacco:  Never Used  . Alcohol Use: No  . Drug Use: No  . Sexual Activity: Not on file   Other Topics Concern  . Not on file   Social History Narrative    Family History Family History  Problem Relation Age of Onset  . Fibromyalgia Mother   . Depression Mother   . Cancer Father     PROSTATECTOMY  . Stroke Father   . Cancer Sister     CERVICAL  . Stroke Paternal Grandfather   . Hypertension Paternal Grandfather   .  Diabetes Paternal Grandfather     Physical Exam Filed Vitals:   09/29/14 2311  BP: 106/51  Pulse: 76  Temp:   Resp: 20    Gen: Comfortable, obese HEENT: moise mucous membranes, EOMI Neck: Supple, no jvd, no carotid bruits CV: RRR. Normal S1,S2. No S3/S4, +2 pulses in bilateral UE Pulm: CTAB, Nl work of breathing. Distant breath sounds Abdomen:Soft, obese, NT/ND. Ext:No c/c/e  Labs:  Results for orders placed or performed during the hospital encounter of 09/29/14 (from the past 24 hour(s))  CBC with Differential     Status: None   Collection Time: 09/29/14  8:26 PM  Result Value Ref Range   WBC 7.3 4.0 - 10.5 K/uL   RBC 4.93 4.22 - 5.81 MIL/uL   Hemoglobin 15.7 13.0 - 17.0 g/dL   HCT 46.8 39.0 - 52.0 %   MCV 94.9 78.0 - 100.0 fL   MCH 31.8 26.0 - 34.0 pg   MCHC 33.5 30.0 - 36.0 g/dL   RDW 13.4 11.5 - 15.5 %   Platelets 222 150 - 400 K/uL   Neutrophils Relative % 68 43 - 77 %   Neutro Abs 5.0 1.7 - 7.7 K/uL   Lymphocytes Relative 21 12 - 46 %   Lymphs Abs 1.5 0.7 - 4.0 K/uL   Monocytes Relative 9 3 - 12 %   Monocytes Absolute 0.7 0.1 - 1.0 K/uL   Eosinophils Relative 2 0 - 5 %   Eosinophils Absolute 0.1 0.0 - 0.7 K/uL   Basophils Relative 0 0 - 1 %   Basophils Absolute 0.0 0.0 - 0.1 K/uL  Basic metabolic panel     Status: Abnormal   Collection Time: 09/29/14  8:26 PM  Result Value Ref Range   Sodium 139 135 - 145 mmol/L   Potassium 4.1 3.5 - 5.1 mmol/L   Chloride 107 96 - 112 mEq/L   CO2 20 19 - 32 mmol/L   Glucose, Bld 114 (H) 70 - 99 mg/dL   BUN 13 6 - 23 mg/dL   Creatinine, Ser 1.13 0.50 - 1.35 mg/dL   Calcium 9.2 8.4 - 10.5 mg/dL   GFR calc non Af Amer 71 (L) >90 mL/min   GFR calc Af Amer 82 (L) >90 mL/min   Anion gap 12 5 - 15  BNP (order ONLY if patient complains of dyspnea/SOB AND you have documented it for THIS visit)     Status: None   Collection Time: 09/29/14  8:26 PM  Result Value Ref Range   B Natriuretic Peptide 19.6 0.0 - 100.0 pg/mL    Protime-INR (if pt is taking Coumadin)     Status: None   Collection Time: 09/29/14  8:26 PM  Result Value Ref Range   Prothrombin Time 12.9 11.6 - 15.2 seconds   INR 0.96 0.00 - 1.49  I-stat troponin, ED (not at [facility])     Status: None   Collection Time: 09/29/14  9:24  PM  Result Value Ref Range   Troponin i, poc 0.00 0.00 - 0.08 ng/mL   Comment 3

## 2014-09-30 NOTE — Progress Notes (Signed)
Patient Name: Bryan Kirby Date of Encounter: 09/30/2014  Principal Problem:   SOB (shortness of breath) Active Problems:   HLD (hyperlipidemia)   Major depression in partial remission   Allergic rhinitis   OSA (obstructive sleep apnea)   Chest tightness   Lung nodule   Primary Cardiologist: New  Patient Profile: 58 yo male w/ hx HLD, OSA/CPAP, pre-DM, FH CAD, morbid obesity, was admitted 12/28 with chest pain.  SUBJECTIVE: Still with chest pain, much milder, 5/10 worst, now 2/10. Continuous > 24 hours.  OBJECTIVE Filed Vitals:   09/30/14 1000 09/30/14 1100 09/30/14 1200 09/30/14 1253  BP: 110/71 105/70 101/65 119/85  Pulse: 69  66 65  Temp:    97.6 F (36.4 C)  TempSrc:    Oral  Resp:   17 16  Height:    5\' 11"  (1.803 m)  Weight:    286 lb 6.4 oz (129.91 kg)  SpO2: 97%  99% 97%   No intake or output data in the 24 hours ending 09/30/14 1311 Filed Weights   09/29/14 2002 09/30/14 1253  Weight: 285 lb (129.275 kg) 286 lb 6.4 oz (129.91 kg)    PHYSICAL EXAM General: Well developed, well nourished, male in no acute distress. Head: Normocephalic, atraumatic.  Neck: Supple without bruits, JVD not elevated. Lungs:  Resp regular and unlabored, CTA. Heart: RRR, S1, S2, no S3, S4, or murmur; no rub. Abdomen: Soft, non-tender, non-distended, BS + x 4.  Extremities: No clubbing, cyanosis, edema.  Neuro: Alert and oriented X 3. Moves all extremities spontaneously. Psych: Normal affect.  LABS: CBC: Recent Labs  09/29/14 2026  WBC 7.3  NEUTROABS 5.0  HGB 15.7  HCT 46.8  MCV 94.9  PLT 222   INR: Recent Labs  09/29/14 2026  INR 0.08   Basic Metabolic Panel: Recent Labs  09/29/14 2026  NA 139  K 4.1  CL 107  CO2 20  GLUCOSE 114*  BUN 13  CREATININE 1.13  CALCIUM 9.2   Liver Function Tests: pending Cardiac Enzymes: pending  Recent Labs  09/29/14 2124  TROPIPOC 0.00   TELE:   SR     ECG: No acute ischemic  changes  Radiology/Studies: Dg Chest 2 View 09/29/2014   CLINICAL DATA:  Shortness of breath x1 week, history of blood clots  EXAM: CHEST  2 VIEW  COMPARISON:  None.  FINDINGS: Lungs are clear.  No pleural effusion or pneumothorax.  The heart is normal in size.  Mild degenerative changes of the visualized thoracolumbar spine.  IMPRESSION: No evidence of acute cardiopulmonary disease.   Electronically Signed   By: Julian Hy M.D.   On: 09/29/2014 20:54   Ct Angio Chest Pe W/cm &/or Wo Cm 09/30/2014   CLINICAL DATA:  Chest pressure and difficulty taking deep breath.  EXAM: CT ANGIOGRAPHY CHEST WITH CONTRAST  TECHNIQUE: Multidetector CT imaging of the chest was performed using the standard protocol during bolus administration of intravenous contrast. Multiplanar CT image reconstructions and MIPs were obtained to evaluate the vascular anatomy.  CONTRAST:  191mL OMNIPAQUE IOHEXOL 350 MG/ML SOLN  COMPARISON:  None.  FINDINGS: THORACIC INLET/BODY WALL:  No acute abnormality.  MEDIASTINUM:  Normal heart size. No pericardial effusion. No acute vascular abnormality, including pulmonary embolism or aortic dissection. No adenopathy.  LUNG WINDOWS:  No consolidation. No effusion. 4 mm noncalcified pulmonary nodule along the right minor fissure (Image 53). Appearance and location favors a lymph node, but followup recommended based on smoking history.  UPPER ABDOMEN:  No acute findings.  OSSEOUS:  No acute fracture.  No suspicious lytic or blastic lesions.  Review of the MIP images confirms the above findings.  IMPRESSION: 1. Negative for pulmonary embolism or other acute intrathoracic finding. 2. 4 mm pulmonary nodule on the right. Given smoking history, follow-up chest CT at 1 year is recommended. This recommendation follows the consensus statement: Guidelines for Management of Small Pulmonary Nodules Detected on CT Scans: A Statement from the [place] as published in Radiology 2005; 237:395-400.    Electronically Signed   By: Jorje Guild M.D.   On: 09/30/2014 01:44     Current Medications:  . aspirin EC  81 mg Oral Daily  . escitalopram  20 mg Oral Daily  . heparin  5,000 Units Subcutaneous 3 times per day  . pantoprazole  40 mg Oral Daily  . simvastatin  40 mg Oral QHS  . sodium chloride  3 mL Intravenous Q12H   . sodium chloride      ASSESSMENT AND PLAN: Principal Problem:   SOB (shortness of breath) - per IM, good air exchange by exam, NAD and no PE by CXR/CT.   Active Problems:   HLD (hyperlipidemia) - per IM, lipids and LFTs ordered for am    Major depression in partial remission - per IM    Allergic rhinitis - per IM    OSA (obstructive sleep apnea) - per IM    Chest tightness - prolonged and without initial enzyme elevations or ECG changes. Will order MV, since still having pain, will do as inpatient. Will need to be 2-day study, do resting images today, stress images in am. Gave pt heart healthy diet, NPO after midnight.    Lung nodule - per IM  Signed, Rosaria Ferries , PA-C 1:11 PM 09/30/2014  Personally seen and examined. Agree with above. Get rest images today, stress tomorrow.  Morbid obesity - weight loss.  Candee Furbish, MD

## 2014-09-30 NOTE — ED Notes (Signed)
Attempted report 

## 2014-10-01 ENCOUNTER — Inpatient Hospital Stay (HOSPITAL_COMMUNITY): Payer: BC Managed Care – PPO

## 2014-10-01 DIAGNOSIS — E785 Hyperlipidemia, unspecified: Secondary | ICD-10-CM | POA: Diagnosis not present

## 2014-10-01 DIAGNOSIS — R0602 Shortness of breath: Secondary | ICD-10-CM | POA: Diagnosis not present

## 2014-10-01 DIAGNOSIS — R079 Chest pain, unspecified: Secondary | ICD-10-CM

## 2014-10-01 DIAGNOSIS — I517 Cardiomegaly: Secondary | ICD-10-CM

## 2014-10-01 LAB — TROPONIN I

## 2014-10-01 LAB — HEMOGLOBIN A1C
Hgb A1c MFr Bld: 6.2 % — ABNORMAL HIGH (ref <5.7)
MEAN PLASMA GLUCOSE: 131 mg/dL — AB (ref <117)

## 2014-10-01 LAB — GLUCOSE, CAPILLARY: Glucose-Capillary: 86 mg/dL (ref 70–99)

## 2014-10-01 MED ORDER — TECHNETIUM TC 99M SESTAMIBI GENERIC - CARDIOLITE
30.0000 | Freq: Once | INTRAVENOUS | Status: AC | PRN
  Administered 2014-10-01: 30 via INTRAVENOUS

## 2014-10-01 MED ORDER — TECHNETIUM TC 99M SESTAMIBI GENERIC - CARDIOLITE: 30.0000 | Freq: Once | INTRAVENOUS | Status: AC | PRN

## 2014-10-01 MED ORDER — REGADENOSON 0.4 MG/5ML IV SOLN
INTRAVENOUS | Status: AC
  Administered 2014-10-01: 0.4 mg
  Filled 2014-10-01: qty 5

## 2014-10-01 NOTE — Progress Notes (Addendum)
Patient Name: Bryan Kirby Date of Encounter: 10/01/2014  Principal Problem:   SOB (shortness of breath) Active Problems:   HLD (hyperlipidemia)   Major depression in partial remission   Allergic rhinitis   OSA (obstructive sleep apnea)   Chest tightness   Lung nodule   Chest pain, moderate coronary artery risk   Morbid obesity   Primary Cardiologist: Dr. Marlou Porch  Patient Profile: 58 yo male w/ hx HLD, OSA/CPAP, pre-DM, FH CAD, morbid obesity, was admitted 12/28 with chest pain.  SUBJECTIVE: Currently CP free. No complaints.   OBJECTIVE Filed Vitals:   09/30/14 1556 09/30/14 2023 09/30/14 2048 10/01/14 0513  BP: 121/79 118/69  117/68  Pulse:  77 66 64  Temp:  97.5 F (36.4 C)  97.5 F (36.4 C)  TempSrc:  Oral  Oral  Resp: 15 18 16 18   Height:      Weight:    282 lb 6.6 oz (128.1 kg)  SpO2: 98% 96% 96% 97%    Intake/Output Summary (Last 24 hours) at 10/01/14 1038 Last data filed at 10/01/14 0800  Gross per 24 hour  Intake    243 ml  Output    300 ml  Net    -57 ml   Filed Weights   09/29/14 2002 09/30/14 1253 10/01/14 0513  Weight: 285 lb (129.275 kg) 286 lb 6.4 oz (129.91 kg) 282 lb 6.6 oz (128.1 kg)    PHYSICAL EXAM General: Well developed, well nourished, male in no acute distress. Head: Normocephalic, atraumatic.  Neck: Supple without bruits, JVD. Lungs:  Resp regular and unlabored, CTA. Heart: RRR, S1, S2, no S3, S4, or murmur; no rub. Abdomen: Soft, non-tender, non-distended, BS + x 4.  Extremities: No clubbing, cyanosis, edema.  Neuro: Alert and oriented X 3. Moves all extremities spontaneously. Psych: Normal affect.  LABS: CBC: Recent Labs  09/29/14 2026 09/30/14 1341  WBC 7.3 5.3  NEUTROABS 5.0  --   HGB 15.7 15.0  HCT 46.8 44.9  MCV 94.9 94.1  PLT 222 240   INR: Recent Labs  09/29/14 2026  INR 2.33   Basic Metabolic Panel: Recent Labs  09/29/14 2026 09/30/14 1341  NA 139 141  K 4.1 4.3  CL 107 109  CO2 20 24    GLUCOSE 114* 89  BUN 13 10  CREATININE 1.13 1.03  CALCIUM 9.2 9.1   Liver Function Tests: Recent Labs  09/30/14 1341  AST 31  ALT 25  ALKPHOS 52  BILITOT 0.7  PROT 6.3  ALBUMIN 4.0   Cardiac Enzymes: Recent Labs  09/30/14 1319 09/30/14 1924 10/01/14 0120  TROPONINI <0.03 <0.03 <0.03    Recent Labs  09/29/14 2124  TROPIPOC 0.00   BNP:No results found for: PROBNP D-dimer:No results for input(s): DDIMER in the last 72 hours. Hemoglobin A1C: Recent Labs  09/30/14 1341  HGBA1C 6.2*   Fasting Lipid Panel: Recent Labs  09/30/14 1341  CHOL 162  HDL 44  LDLCALC 99  TRIG 95  CHOLHDL 3.7   Thyroid Function Tests:No results for input(s): TSH, T4TOTAL, T3FREE, THYROIDAB in the last 72 hours.  Invalid input(s): FREET3 Anemia Panel:No results for input(s): VITAMINB12, FOLATE, FERRITIN, TIBC, IRON, RETICCTPCT in the last 72 hours.  TELE:        ECG:   Radiology/Studies: Dg Chest 2 View 09/29/2014 CLINICAL DATA: Shortness of breath x1 week, history of blood clots EXAM: CHEST 2 VIEW COMPARISON: None. FINDINGS: Lungs are clear. No pleural effusion or pneumothorax. The heart  is normal in size. Mild degenerative changes of the visualized thoracolumbar spine. IMPRESSION: No evidence of acute cardiopulmonary disease. Electronically Signed By: Julian Hy M.D. On: 09/29/2014 20:54   Ct Angio Chest Pe W/cm &/or Wo Cm 09/30/2014 CLINICAL DATA: Chest pressure and difficulty taking deep breath. EXAM: CT ANGIOGRAPHY CHEST WITH CONTRAST TECHNIQUE: Multidetector CT imaging of the chest was performed using the standard protocol during bolus administration of intravenous contrast. Multiplanar CT image reconstructions and MIPs were obtained to evaluate the vascular anatomy. CONTRAST: 125mL OMNIPAQUE IOHEXOL 350 MG/ML SOLN COMPARISON: None. FINDINGS: THORACIC INLET/BODY WALL: No acute abnormality. MEDIASTINUM: Normal heart size. No pericardial  effusion. No acute vascular abnormality, including pulmonary embolism or aortic dissection. No adenopathy. LUNG WINDOWS: No consolidation. No effusion. 4 mm noncalcified pulmonary nodule along the right minor fissure (Image 53). Appearance and location favors a lymph node, but followup recommended based on smoking history. UPPER ABDOMEN: No acute findings. OSSEOUS: No acute fracture. No suspicious lytic or blastic lesions. Review of the MIP images confirms the above findings. IMPRESSION: 1. Negative for pulmonary embolism or other acute intrathoracic finding. 2. 4 mm pulmonary nodule on the right. Given smoking history, follow-up chest CT at 1 year is recommended. This recommendation follows the consensus statement: Guidelines for Management of Small Pulmonary Nodules Detected on CT Scans: A Statement from the [place] as published in Radiology 2005; 237:395-400. Electronically Signed By: Jorje Guild M.D. On: 09/30/2014 01:44    Current Medications:  . aspirin EC  81 mg Oral Daily  . escitalopram  20 mg Oral Daily  . heparin  5,000 Units Subcutaneous 3 times per day  . pantoprazole  40 mg Oral Daily  . simvastatin  40 mg Oral QHS  . sodium chloride  3 mL Intravenous Q12H   . sodium chloride      ASSESSMENT AND PLAN: Principal Problem:  SOB (shortness of breath) - per IM, good air exchange by exam, NAD and no PE by CXR/CT.  Active Problems:  HLD (hyperlipidemia) - per IM, lipids and LFTs ordered for am   Major depression in partial remission - per IM   Allergic rhinitis - per IM   OSA (obstructive sleep apnea) - per IM   Chest tightness - prolonged and without initial enzyme elevations or ECG changes. Doing  MV, as inpatient since still having pain, 2-day study, resting images done 12/29, stress images 12/30. Radiologist interpretation pending. If negative for ischemia, ok to discharge from a cardiac standpoint. If test is abnormal, will need to keep  for cath.    Lung nodule - per IM  Signed, Lyda Jester, PA-C 1:11 PM 09/30/2014  Personally seen and examined. Agree with above. Candee Furbish, MD

## 2014-10-01 NOTE — Progress Notes (Signed)
UR Completed Emet Rafanan Graves-Bigelow, RN,BSN [phone]  

## 2014-10-01 NOTE — Progress Notes (Signed)
   I spoke with Dr. Radford Pax regarding his NST findings: Small sized, mild intensity partially fixed defect in the mid and basal inferior wall consistent with a small area of ischemia vs.variations in attenuation artifact. This was interpreted as an intermediate risk study. Based on his multiple cardiac risk factors, Dr. Radford Pax recommended further w/u: LHC vs coronary CT. Will make NPO at midnight. Decision to be made by rounding MD in the am. The patient is in agreement with plan.   SIMMONS, BRITTAINY 10/01/2014

## 2014-10-01 NOTE — Progress Notes (Signed)
Patient has already placed on CPAP and is tolerating well at this time.

## 2014-10-01 NOTE — Progress Notes (Signed)
  Echocardiogram 2D Echocardiogram has been performed.  Bryan Kirby 10/01/2014, 2:26 PM

## 2014-10-02 ENCOUNTER — Encounter (HOSPITAL_COMMUNITY)
Admission: EM | Disposition: A | Discharge status: Home / Self Care | Payer: Self-pay | Source: Home / Self Care | Attending: Internal Medicine

## 2014-10-02 ENCOUNTER — Encounter (HOSPITAL_COMMUNITY): Payer: Self-pay | Admitting: Cardiovascular Disease

## 2014-10-02 DIAGNOSIS — J309 Allergic rhinitis, unspecified: Secondary | ICD-10-CM | POA: Diagnosis present

## 2014-10-02 DIAGNOSIS — K219 Gastro-esophageal reflux disease without esophagitis: Secondary | ICD-10-CM | POA: Diagnosis present

## 2014-10-02 DIAGNOSIS — Z823 Family history of stroke: Secondary | ICD-10-CM | POA: Diagnosis not present

## 2014-10-02 DIAGNOSIS — Z6839 Body mass index (BMI) 39.0-39.9, adult: Secondary | ICD-10-CM | POA: Diagnosis not present

## 2014-10-02 DIAGNOSIS — F329 Major depressive disorder, single episode, unspecified: Secondary | ICD-10-CM | POA: Diagnosis present

## 2014-10-02 DIAGNOSIS — Z888 Allergy status to other drugs, medicaments and biological substances status: Secondary | ICD-10-CM | POA: Diagnosis not present

## 2014-10-02 DIAGNOSIS — R931 Abnormal findings on diagnostic imaging of heart and coronary circulation: Secondary | ICD-10-CM | POA: Insufficient documentation

## 2014-10-02 DIAGNOSIS — G4733 Obstructive sleep apnea (adult) (pediatric): Secondary | ICD-10-CM | POA: Diagnosis present

## 2014-10-02 DIAGNOSIS — Z880 Allergy status to penicillin: Secondary | ICD-10-CM | POA: Diagnosis not present

## 2014-10-02 DIAGNOSIS — R0602 Shortness of breath: Secondary | ICD-10-CM | POA: Diagnosis present

## 2014-10-02 DIAGNOSIS — R0789 Other chest pain: Secondary | ICD-10-CM | POA: Diagnosis present

## 2014-10-02 DIAGNOSIS — Z7982 Long term (current) use of aspirin: Secondary | ICD-10-CM | POA: Diagnosis not present

## 2014-10-02 DIAGNOSIS — E785 Hyperlipidemia, unspecified: Secondary | ICD-10-CM | POA: Diagnosis present

## 2014-10-02 DIAGNOSIS — Z87891 Personal history of nicotine dependence: Secondary | ICD-10-CM | POA: Diagnosis not present

## 2014-10-02 DIAGNOSIS — Z86718 Personal history of other venous thrombosis and embolism: Secondary | ICD-10-CM | POA: Diagnosis not present

## 2014-10-02 DIAGNOSIS — R911 Solitary pulmonary nodule: Secondary | ICD-10-CM | POA: Diagnosis present

## 2014-10-02 DIAGNOSIS — Z809 Family history of malignant neoplasm, unspecified: Secondary | ICD-10-CM | POA: Diagnosis not present

## 2014-10-02 DIAGNOSIS — R9439 Abnormal result of other cardiovascular function study: Secondary | ICD-10-CM | POA: Diagnosis present

## 2014-10-02 HISTORY — PX: LEFT HEART CATHETERIZATION WITH CORONARY ANGIOGRAM: SHX5451

## 2014-10-02 LAB — GLUCOSE, CAPILLARY: GLUCOSE-CAPILLARY: 96 mg/dL (ref 70–99)

## 2014-10-02 SURGERY — LEFT HEART CATHETERIZATION WITH CORONARY ANGIOGRAM
Anesthesia: LOCAL

## 2014-10-02 MED ORDER — FENTANYL CITRATE 0.05 MG/ML IJ SOLN
INTRAMUSCULAR | Status: AC
  Filled 2014-10-02: qty 2

## 2014-10-02 MED ORDER — MIDAZOLAM HCL 2 MG/2ML IJ SOLN
INTRAMUSCULAR | Status: AC
  Filled 2014-10-02: qty 2

## 2014-10-02 MED ORDER — SODIUM CHLORIDE 0.9 % IV SOLN: 1.0000 mL/kg/h | INTRAVENOUS | Status: DC

## 2014-10-02 MED ORDER — NITROGLYCERIN 1 MG/10 ML FOR IR/CATH LAB
INTRA_ARTERIAL | Status: AC
  Filled 2014-10-02: qty 10

## 2014-10-02 MED ORDER — HEPARIN SODIUM (PORCINE) 1000 UNIT/ML IJ SOLN
INTRAMUSCULAR | Status: AC
  Filled 2014-10-02: qty 1

## 2014-10-02 MED ORDER — VERAPAMIL HCL 2.5 MG/ML IV SOLN
INTRAVENOUS | Status: AC
  Filled 2014-10-02: qty 2

## 2014-10-02 MED ORDER — LIDOCAINE HCL (PF) 1 % IJ SOLN
INTRAMUSCULAR | Status: AC
  Filled 2014-10-02: qty 30

## 2014-10-02 MED ORDER — HEPARIN (PORCINE) IN NACL 2-0.9 UNIT/ML-% IJ SOLN
INTRAMUSCULAR | Status: AC
  Filled 2014-10-02: qty 1000

## 2014-10-02 NOTE — Discharge Summary (Signed)
Physician Discharge Summary  Bryan Kirby:[id number] DOB: 11/05/1955 DOA: 09/29/2014  PCP: Owens Loffler, MD  Admit date: 09/29/2014 Discharge date: 10/02/2014  Time spent: 35 minutes  Recommendations for Outpatient Follow-up:  1. Follow up with PCP as an outpatient  Discharge Diagnoses:  Principal Problem:   SOB (shortness of breath) Active Problems:   HLD (hyperlipidemia)   Major depression in partial remission   Allergic rhinitis   OSA (obstructive sleep apnea)   Chest tightness   Lung nodule   Chest pain, moderate coronary artery risk   Morbid obesity   Shortness of breath   Atypical chest pain   Abnormal nuclear cardiac imaging test   Discharge Condition: stable  Diet recommendation: heart healthy  Filed Weights   09/30/14 1253 10/01/14 0513 10/02/14 0509  Weight: 129.91 kg (286 lb 6.4 oz) 128.1 kg (282 lb 6.6 oz) 128.141 kg (282 lb 8 oz)    History of present illness:  58 y.o. male with past medical history of hyperlipidemia, depression, GERD, OSA, history of DVT, who presents with shortness of breath. Patient reports that he has been having shortness of breath in the past 4-5 days. He also has mild chest tightness. His symptoms seems to be exertional. No cough, no fever or chills. No leg edema or tenderness over the calf area. Patient denies fever, chills, fatigue, headaches, cough, abdominal pain, diarrhea, constipation, dysuria, urgency, frequency, hematuria, skin rashes, joint pain or leg swelling.  Hospital Course:  SOB/CP - Chest x-ray is negative for acute abnormalities. CTA is negative for PE.  - No events on telemetry. Ekg no signs of precarditis. - Tropnins negative  x3 - 2D echo as below, stress test concerning for ischemia. - Cardiac cath on 12.31.2015 Normal coronary arteries by angiography. - Cont Protonix.  Depression:  - Stable.  -Continue Lexapro  OSA: -CPAP  Procedures:  Myoview  Cardiac  cath  Consultations:  Cardiology  Discharge Exam: Filed Vitals:   10/02/14 1337  BP:   Pulse: 65  Temp:   Resp:     General: A&o x3 Cardiovascular: RRR Respiratory: good air movement CTA B/L  Discharge Instructions   Discharge Instructions    Diet - low sodium heart healthy    Complete by:  As directed      Increase activity slowly    Complete by:  As directed           Current Discharge Medication List    CONTINUE these medications which have NOT CHANGED   Details  aspirin 81 MG tablet Take 81 mg by mouth daily.    escitalopram (LEXAPRO) 20 MG tablet TAKE 1 TABLET BY MOUTH DAILY Qty: 30 tablet, Refills: 5    pantoprazole (PROTONIX) 40 MG tablet TAKE ONE TABLET BY MOUTH EACH MORNING 30MINUTES BEFORE BREAKFAST Qty: 30 tablet, Refills: 5    simvastatin (ZOCOR) 40 MG tablet TAKE ONE TABLET BY MOUTH AT BEDTIME Qty: 30 tablet, Refills: 5       Allergies  Allergen Reactions  . Codeine     REACTION: itching  . Penicillins     REACTION: UNSPECIFIED      The results of significant diagnostics from this hospitalization (including imaging, microbiology, ancillary and laboratory) are listed below for reference.    Significant Diagnostic Studies: Dg Chest 2 View  09/29/2014   CLINICAL DATA:  Shortness of breath x1 week, history of blood clots  EXAM: CHEST  2 VIEW  COMPARISON:  None.  FINDINGS: Lungs are clear.  No pleural  effusion or pneumothorax.  The heart is normal in size.  Mild degenerative changes of the visualized thoracolumbar spine.  IMPRESSION: No evidence of acute cardiopulmonary disease.   Electronically Signed   By: Julian Hy M.D.   On: 09/29/2014 20:54   Ct Angio Chest Pe W/cm &/or Wo Cm  09/30/2014   CLINICAL DATA:  Chest pressure and difficulty taking deep breath.  EXAM: CT ANGIOGRAPHY CHEST WITH CONTRAST  TECHNIQUE: Multidetector CT imaging of the chest was performed using the standard protocol during bolus administration of intravenous  contrast. Multiplanar CT image reconstructions and MIPs were obtained to evaluate the vascular anatomy.  CONTRAST:  135mL OMNIPAQUE IOHEXOL 350 MG/ML SOLN  COMPARISON:  None.  FINDINGS: THORACIC INLET/BODY WALL:  No acute abnormality.  MEDIASTINUM:  Normal heart size. No pericardial effusion. No acute vascular abnormality, including pulmonary embolism or aortic dissection. No adenopathy.  LUNG WINDOWS:  No consolidation. No effusion. 4 mm noncalcified pulmonary nodule along the right minor fissure (Image 53). Appearance and location favors a lymph node, but followup recommended based on smoking history.  UPPER ABDOMEN:  No acute findings.  OSSEOUS:  No acute fracture.  No suspicious lytic or blastic lesions.  Review of the MIP images confirms the above findings.  IMPRESSION: 1. Negative for pulmonary embolism or other acute intrathoracic finding. 2. 4 mm pulmonary nodule on the right. Given smoking history, follow-up chest CT at 1 year is recommended. This recommendation follows the consensus statement: Guidelines for Management of Small Pulmonary Nodules Detected on CT Scans: A Statement from the [place] as published in Radiology 2005; 237:395-400.   Electronically Signed   By: Jorje Guild M.D.   On: 09/30/2014 01:44   Nm Myocar Multi W/spect W/wall Motion / Ef  10/01/2014   CLINICAL DATA:  Chest pain  EXAM: MYOCARDIAL IMAGING WITH SPECT (REST AND PHARMACOLOGIC-STRESS)  GATED LEFT VENTRICULAR WALL MOTION STUDY  LEFT VENTRICULAR EJECTION FRACTION  TECHNIQUE: Standard myocardial SPECT imaging was performed after resting intravenous injection of 10 mCi Tc-37m sestamibi. Subsequently, intravenous infusion of Lexiscan was performed under the supervision of the Cardiology staff. At peak effect of the drug, 30 mCi Tc-14m sestamibi was injected intravenously and standard myocardial SPECT imaging was performed. Quantitative gated imaging was also performed to evaluate left ventricular wall motion, and  estimate left ventricular ejection fraction.  COMPARISON:  None.  FINDINGS: Baseline EKG: NSR with no ST changes. The patient had no ST changes during the Lexiscan infusion. No chest pain noted during the study.  RAW images reveal significant diaphragmatic attenuation as well as increased gut uptake below the diaphragm. There is also overlying soft tissue attenuation.  Perfusion: There is a small sized, mild intensity, partially fixed defect in the mid and basal inferolateral wall that may represent a small area of ischemia but could also be due to variations in diaphragmatic and soft tissue attenuation. The patient weighs >240lbs.  Wall Motion: Normal left ventricular wall motion. No left ventricular dilation.  Left Ventricular Ejection Fraction: 62 %  End diastolic volume 213 ml  End systolic volume 41 ml  IMPRESSION: 1. Small sized, mild intensity partially fixed defect in the mid and basal inferior wall consistent with a small area of ischemia vs. Variations in attenuation artifact.  2. Normal left ventricular wall motion.  3. Left ventricular ejection fraction 62%  4. Intermediaterisk stress test findings*.  *2012 Appropriate Use Criteria for Coronary Revascularization Focused Update: J Am Coll Cardiol. 0865;78(4):696-295. http://content.[email or web address].aspx?articleid=[email or web address]   Electronically Signed  By: Fransico Him   On: 10/01/2014 18:52    Microbiology: No results found for this or any previous visit (from the past 240 hour(s)).   Labs: Basic Metabolic Panel:  Recent Labs Lab 09/29/14 2026 09/30/14 1341  NA 139 141  K 4.1 4.3  CL 107 109  CO2 20 24  GLUCOSE 114* 89  BUN 13 10  CREATININE 1.13 1.03  CALCIUM 9.2 9.1   Liver Function Tests:  Recent Labs Lab 09/30/14 1341  AST 31  ALT 25  ALKPHOS 52  BILITOT 0.7  PROT 6.3  ALBUMIN 4.0   No results for input(s): LIPASE, AMYLASE in the last 168 hours. No results for input(s): AMMONIA in the last 168  hours. CBC:  Recent Labs Lab 09/29/14 2026 09/30/14 1341  WBC 7.3 5.3  NEUTROABS 5.0  --   HGB 15.7 15.0  HCT 46.8 44.9  MCV 94.9 94.1  PLT 222 240   Cardiac Enzymes:  Recent Labs Lab 09/30/14 1319 09/30/14 1924 10/01/14 0120  TROPONINI <0.03 <0.03 <0.03   BNP: BNP (last 3 results) No results for input(s): PROBNP in the last 8760 hours. CBG:  Recent Labs Lab 10/01/14 0740 10/02/14 0726  GLUCAP 86 96       Signed:  FELIZ ORTIZ, Hollis Tuller  [facility] 10/02/2014, 2:36 PM

## 2014-10-02 NOTE — Progress Notes (Signed)
Pt d/c to home with wife. VSS, verbalized understanding of d/c instructions. CATH sites level zero, no bleeding. Pressure dressings applied. All belongings returned.

## 2014-10-02 NOTE — Progress Notes (Addendum)
Patient Name: Bryan Kirby Date of Encounter: 10/02/2014  Principal Problem:   SOB (shortness of breath) Active Problems:   HLD (hyperlipidemia)   Major depression in partial remission   Allergic rhinitis   OSA (obstructive sleep apnea)   Chest tightness   Lung nodule   Chest pain, moderate coronary artery risk   Morbid obesity   Shortness of breath   Primary Cardiologist: Dr. Marlou Porch  Patient Profile: 58 yo male w/ hx HLD, OSA/CPAP, pre-DM, FH CAD, morbid obesity, was admitted 12/28 with chest pain.  SUBJECTIVE: Currently CP free. No complaints. NUC and ECHO done.   OBJECTIVE Filed Vitals:   10/01/14 1131 10/01/14 1430 10/01/14 2041 10/02/14 0509  BP: 123/65 123/73 101/67 111/65  Pulse:  68 68 66  Temp:  98 F (36.7 C) 98.1 F (36.7 C) 97.5 F (36.4 C)  TempSrc:  Oral Oral Oral  Resp:  18 18 18   Height:      Weight:    282 lb 8 oz (128.141 kg)  SpO2:  97% 95% 97%    Intake/Output Summary (Last 24 hours) at 10/02/14 0819 Last data filed at 10/02/14 0510  Gross per 24 hour  Intake      0 ml  Output    700 ml  Net   -700 ml   Filed Weights   09/30/14 1253 10/01/14 0513 10/02/14 0509  Weight: 286 lb 6.4 oz (129.91 kg) 282 lb 6.6 oz (128.1 kg) 282 lb 8 oz (128.141 kg)    PHYSICAL EXAM General: Well developed, well nourished, male in no acute distress. Head: Normocephalic, atraumatic.  Neck: Supple without bruits, JVD. Lungs:  Resp regular and unlabored, CTA. Heart: RRR, S1, S2, no S3, S4, or murmur; no rub. Abdomen: Soft, non-tender, non-distended, BS + x 4.  Extremities: No clubbing, cyanosis, edema.  Neuro: Alert and oriented X 3. Moves all extremities spontaneously. Psych: Normal affect.  LABS: CBC:  Recent Labs  09/29/14 2026 09/30/14 1341  WBC 7.3 5.3  NEUTROABS 5.0  --   HGB 15.7 15.0  HCT 46.8 44.9  MCV 94.9 94.1  PLT 222 240   INR:  Recent Labs  09/29/14 2026  INR 7.67   Basic Metabolic Panel:  Recent Labs   09/29/14 2026 09/30/14 1341  NA 139 141  K 4.1 4.3  CL 107 109  CO2 20 24  GLUCOSE 114* 89  BUN 13 10  CREATININE 1.13 1.03  CALCIUM 9.2 9.1   Liver Function Tests:  Recent Labs  09/30/14 1341  AST 31  ALT 25  ALKPHOS 52  BILITOT 0.7  PROT 6.3  ALBUMIN 4.0   Cardiac Enzymes:  Recent Labs  09/30/14 1319 09/30/14 1924 10/01/14 0120  TROPONINI <0.03 <0.03 <0.03    Recent Labs  09/29/14 2124  TROPIPOC 0.00   BNP:No results found for: PROBNP D-dimer:No results for input(s): DDIMER in the last 72 hours. Hemoglobin A1C:  Recent Labs  09/30/14 1341  HGBA1C 6.2*   Fasting Lipid Panel:  Recent Labs  09/30/14 1341  CHOL 162  HDL 44  LDLCALC 99  TRIG 95  CHOLHDL 3.7   Thyroid Function Tests:No results for input(s): TSH, T4TOTAL, T3FREE, THYROIDAB in the last 72 hours.  Invalid input(s): FREET3 Anemia Panel:No results for input(s): VITAMINB12, FOLATE, FERRITIN, TIBC, IRON, RETICCTPCT in the last 72 hours.  TELE:        ECG:   Radiology/Studies: Dg Chest 2 View 09/29/2014 CLINICAL DATA: Shortness of breath x1 week,  history of blood clots EXAM: CHEST 2 VIEW COMPARISON: None. FINDINGS: Lungs are clear. No pleural effusion or pneumothorax. The heart is normal in size. Mild degenerative changes of the visualized thoracolumbar spine. IMPRESSION: No evidence of acute cardiopulmonary disease. Electronically Signed By: Julian Hy M.D. On: 09/29/2014 20:54   Ct Angio Chest Pe W/cm &/or Wo Cm 09/30/2014 CLINICAL DATA: Chest pressure and difficulty taking deep breath. EXAM: CT ANGIOGRAPHY CHEST WITH CONTRAST TECHNIQUE: Multidetector CT imaging of the chest was performed using the standard protocol during bolus administration of intravenous contrast. Multiplanar CT image reconstructions and MIPs were obtained to evaluate the vascular anatomy. CONTRAST: 184mL OMNIPAQUE IOHEXOL 350 MG/ML SOLN COMPARISON: None. FINDINGS: THORACIC  INLET/BODY WALL: No acute abnormality. MEDIASTINUM: Normal heart size. No pericardial effusion. No acute vascular abnormality, including pulmonary embolism or aortic dissection. No adenopathy. LUNG WINDOWS: No consolidation. No effusion. 4 mm noncalcified pulmonary nodule along the right minor fissure (Image 53). Appearance and location favors a lymph node, but followup recommended based on smoking history. UPPER ABDOMEN: No acute findings. OSSEOUS: No acute fracture. No suspicious lytic or blastic lesions. Review of the MIP images confirms the above findings. IMPRESSION: 1. Negative for pulmonary embolism or other acute intrathoracic finding. 2. 4 mm pulmonary nodule on the right. Given smoking history, follow-up chest CT at 1 year is recommended. This recommendation follows the consensus statement: Guidelines for Management of Small Pulmonary Nodules Detected on CT Scans: A Statement from the [place] as published in Radiology 2005; 237:395-400. Electronically Signed By: Jorje Guild M.D. On: 09/30/2014 01:44    Current Medications:  . aspirin EC  81 mg Oral Daily  . escitalopram  20 mg Oral Daily  . heparin  5,000 Units Subcutaneous 3 times per day  . pantoprazole  40 mg Oral Daily  . simvastatin  40 mg Oral QHS  . sodium chloride  3 mL Intravenous Q12H   . sodium chloride     ECHO: - Left ventricle: Wall thickness was increased in a pattern of mild LVH. There was mild focal basal hypertrophy of the septum. Systolic function was normal. The estimated ejection fraction was in the range of 55% to 60%. Wall motion was normal; there were no regional wall motion abnormalities. Doppler parameters are consistent with abnormal left ventricular relaxation (grade 1 diastolic dysfunction). - Left atrium: The atrium was mildly dilated.  NUC: 1. Small sized, mild intensity partially fixed defect in the mid and basal inferior wall consistent with a small area  of ischemia vs. Variations in attenuation artifact.  2. Normal left ventricular wall motion.  3. Left ventricular ejection fraction 62%  4. Intermediaterisk stress test findings*.  ASSESSMENT AND PLAN: Principal Problem:  SOB (shortness of breath) - per IM, good air exchange by exam, NAD and no PE by CXR/CT.  Active Problems:  HLD (hyperlipidemia) - per IM   Major depression in partial remission - per IM   Allergic rhinitis - per IM   OSA (obstructive sleep apnea) - per IM   Chest tightness - prolonged and without initial enzyme elevations or ECG changes. Abnormal NUC - question inferior changes, EF normal. With risk factors, symptoms, cath. Risk and benefits discussed (stroke, MI, death, bleeding). Radial.     Lung nodule - per IM  Candee Furbish, MD  OK with DC if cath OK.   Candee Furbish, MD

## 2014-10-02 NOTE — Op Note (Signed)
CARDIAC CATHETERIZATION REPORT   Procedures performed:  1. Left heart catheterization  2. Selective coronary angiography  3. Left ventriculography   Reason for procedure:  Abnormal nuclear stress test  Procedure performed by: Sanda Klein, MD, [facility]  Complications: none   Estimated blood loss: less than 5 mL   History:  58 year old man with intermediate coronary risk profile admitted for chest pain, found to have an abnormal nuclear stress test.  Consent: The risks, benefits, and details of the procedure were explained to the patient. Risks including death, MI, stroke, bleeding, limb ischemia, renal failure and allergy were described and accepted by the patient. Informed written consent was obtained prior to proceeding.  Technique: The patient was brought to the cardiac catheterization laboratory in the fasting state. He was prepped and draped in the usual sterile fashion. Local anesthesia with 1% lidocaine was administered to the right wrist area. Using the modified Seldinger technique a 5 French right radial artery sheath was introduced without difficulty. Under fluoroscopic guidance, using 5 French JL3.5 and TIG catheters, selective cannulation of the left coronary artery, right coronary artery and left ventricle were respectively performed. Several coronary angiograms in a variety of projections were recorded. Left ventricular pressure and a pull back to the aorta were recorded. No immediate complications occurred. At the end of the procedure, all catheters were removed. After the procedure, hemostasis will be achieved with manual pressure.  Contrast used: 70 mL Omnipaque  Medications: Verapamil 3 mg intraarterially. Versed 2 mg IV, Fentanyl 25 mcg IV, Heparin 6500 units IV, lidocaine 1% 5 mL locally.  Angiographic Findings:  1. The left main coronary artery is free of significant atherosclerosis and bifurcates in the usual fashion into the left anterior descending artery and left  circumflex coronary artery.  2. The left anterior descending artery is a large vessel that reaches the apex and generates one major diagonal branch. There is evidence of minimal luminal irregularities and the distal vessel is small in caliber. There is no calcification. No hemodynamically meaningful stenoses are seen. 3. The left circumflex coronary artery is a large-size vessel co-dominant vessel that generates 2 major oblique marginal arteries, a posterolateral artery and a codominant PDA. There is evidence of no luminal irregularities and no calcification. No hemodynamically meaningful stenoses are seen. 4. The right coronary artery is a medium-size co-dominant vessel that generates RV branches and a posterior descending artery. There is evidence of no luminal irregularities and no calcification. No hemodynamically meaningful stenoses are seen.  5. The left ventricle was not injected. Normal LVEF by noninvasive studies There is no aortic valve stenosis by pullback. The left ventricular end-diastolic pressure is 62-95 mm Hg.    IMPRESSIONS:  Normal coronary arteries by angiography .  RECOMMENDATION:  "False positive" nuclear study, likely due to body habitus. Consider non cardiac cause of symptoms.    Sanda Klein, MD, [facility] [phone] office [phone] pager

## 2014-10-02 NOTE — Progress Notes (Signed)
UR completed 

## 2014-10-02 NOTE — Progress Notes (Signed)
[facility] PROGRESS NOTE  Assessment/Plan:  SOB/CP - Etiology is not clear. - Chest x-ray is negative for acute abnormalities. CTA is negative for PE.  - no events on telemetry. - Cycle Tropnins x3 - 2D echo as below, stress test concerning, cardiac cath on 12.31.2015.  Depression: Stable. No suicidal or homicidal ideations. -Continue Lexapro  OSA: -CPAP  Code Status: FULl Family Communication: wife  Disposition Plan: obervation   Consultants:  cardiology  Procedures: Myoview 12.30.2015: Small sized, mild intensity partially fixed defect in the mid and basal inferior   Antibiotics:  none  HPI/Subjective: No complains  Objective: Filed Vitals:   10/01/14 1131 10/01/14 1430 10/01/14 2041 10/02/14 0509  BP: 123/65 123/73 101/67 111/65  Pulse:  68 68 66  Temp:  98 F (36.7 C) 98.1 F (36.7 C) 97.5 F (36.4 C)  TempSrc:  Oral Oral Oral  Resp:  18 18 18   Height:      Weight:    128.141 kg (282 lb 8 oz)  SpO2:  97% 95% 97%    Intake/Output Summary (Last 24 hours) at 10/02/14 1044 Last data filed at 10/02/14 0900  Gross per 24 hour  Intake      0 ml  Output    700 ml  Net   -700 ml   Filed Weights   09/30/14 1253 10/01/14 0513 10/02/14 0509  Weight: 129.91 kg (286 lb 6.4 oz) 128.1 kg (282 lb 6.6 oz) 128.141 kg (282 lb 8 oz)    Exam:  General: Alert, awake, oriented x3, in no acute distress.  HEENT: No bruits, no goiter.  Heart: Regular rate and rhythm. Lungs: Good air movement,clear Abdomen: Soft, nontender, nondistended, positive bowel sounds.    Data Reviewed: Basic Metabolic Panel:  Recent Labs Lab 09/29/14 2026 09/30/14 1341  NA 139 141  K 4.1 4.3  CL 107 109  CO2 20 24  GLUCOSE 114* 89  BUN 13 10  CREATININE 1.13 1.03  CALCIUM 9.2 9.1   Liver Function Tests:  Recent Labs Lab 09/30/14 1341  AST 31  ALT 25  ALKPHOS 52  BILITOT 0.7  PROT 6.3  ALBUMIN 4.0   No results for input(s): LIPASE, AMYLASE in the last  168 hours. No results for input(s): AMMONIA in the last 168 hours. CBC:  Recent Labs Lab 09/29/14 2026 09/30/14 1341  WBC 7.3 5.3  NEUTROABS 5.0  --   HGB 15.7 15.0  HCT 46.8 44.9  MCV 94.9 94.1  PLT 222 240   Cardiac Enzymes:  Recent Labs Lab 09/30/14 1319 09/30/14 1924 10/01/14 0120  TROPONINI <0.03 <0.03 <0.03   BNP (last 3 results) No results for input(s): PROBNP in the last 8760 hours. CBG:  Recent Labs Lab 10/01/14 0740 10/02/14 0726  GLUCAP 86 96    No results found for this or any previous visit (from the past 240 hour(s)).   Studies: Nm Myocar Multi W/spect W/wall Motion / Ef  10/01/2014   CLINICAL DATA:  Chest pain  EXAM: MYOCARDIAL IMAGING WITH SPECT (REST AND PHARMACOLOGIC-STRESS)  GATED LEFT VENTRICULAR WALL MOTION STUDY  LEFT VENTRICULAR EJECTION FRACTION  TECHNIQUE: Standard myocardial SPECT imaging was performed after resting intravenous injection of 10 mCi Tc-40m sestamibi. Subsequently, intravenous infusion of Lexiscan was performed under the supervision of the Cardiology staff. At peak effect of the drug, 30 mCi Tc-11m sestamibi was injected intravenously and standard myocardial SPECT imaging was performed. Quantitative gated imaging was also performed to evaluate left ventricular wall motion, and estimate  left ventricular ejection fraction.  COMPARISON:  None.  FINDINGS: Baseline EKG: NSR with no ST changes. The patient had no ST changes during the Lexiscan infusion. No chest pain noted during the study.  RAW images reveal significant diaphragmatic attenuation as well as increased gut uptake below the diaphragm. There is also overlying soft tissue attenuation.  Perfusion: There is a small sized, mild intensity, partially fixed defect in the mid and basal inferolateral wall that may represent a small area of ischemia but could also be due to variations in diaphragmatic and soft tissue attenuation. The patient weighs >240lbs.  Wall Motion: Normal left  ventricular wall motion. No left ventricular dilation.  Left Ventricular Ejection Fraction: 62 %  End diastolic volume 545 ml  End systolic volume 41 ml  IMPRESSION: 1. Small sized, mild intensity partially fixed defect in the mid and basal inferior wall consistent with a small area of ischemia vs. Variations in attenuation artifact.  2. Normal left ventricular wall motion.  3. Left ventricular ejection fraction 62%  4. Intermediaterisk stress test findings*.  *2012 Appropriate Use Criteria for Coronary Revascularization Focused Update: J Am Coll Cardiol. 6256;38(9):373-428. http://content.airportbarriers.com.aspx?articleid=1201161   Electronically Signed   By: Fransico Him   On: 10/01/2014 18:52    Scheduled Meds: . aspirin EC  81 mg Oral Daily  . escitalopram  20 mg Oral Daily  . heparin  5,000 Units Subcutaneous 3 times per day  . pantoprazole  40 mg Oral Daily  . simvastatin  40 mg Oral QHS  . sodium chloride  3 mL Intravenous Q12H   Continuous Infusions: . sodium chloride       Charlynne Cousins  [facility] Pager [phone]. If 8PM-8AM, please contact night-coverage at [email or web address], password [facility] 10/02/2014, 10:44 AM  LOS: 3 days

## 2014-10-02 NOTE — Progress Notes (Signed)
CAD Assessment (Coronary Angiography With or Without Left Heart Catheterization and/or Left Ventriculography)  Patient Information:    Suspected ACS with newly diagnosed resting myocardial perfusion defect   Intermediate ACS Risk Score (e.g.,TIMI, GRACE)  AUC Score:   A (8)   Indication:   4

## 2014-11-25 ENCOUNTER — Other Ambulatory Visit: Payer: Self-pay | Admitting: Family Medicine

## 2014-11-25 NOTE — Telephone Encounter (Signed)
Last office visit 12/18/2013.  Last CPE 08/22/2012.  Last Lipid 09/30/2014 while in hospital.  Ok to refill?

## 2014-11-28 ENCOUNTER — Ambulatory Visit (INDEPENDENT_AMBULATORY_CARE_PROVIDER_SITE_OTHER): Payer: BLUE CROSS/BLUE SHIELD | Admitting: Adult Health

## 2014-11-28 ENCOUNTER — Encounter: Payer: Self-pay | Admitting: Adult Health

## 2014-11-28 VITALS — BP 122/70 | HR 71 | Temp 97.8°F | Ht 71.5 in | Wt 288.6 lb

## 2014-11-28 DIAGNOSIS — G4733 Obstructive sleep apnea (adult) (pediatric): Secondary | ICD-10-CM

## 2014-11-28 DIAGNOSIS — R911 Solitary pulmonary nodule: Secondary | ICD-10-CM

## 2014-11-28 NOTE — Addendum Note (Signed)
Addended by: Parke Poisson E on: 11/28/2014 10:22 AM   Modules accepted: Orders

## 2014-11-28 NOTE — Assessment & Plan Note (Signed)
Great compliance   [employer] job. Using your CPAP , keep up the good work Continue to work on weight loss Do not drive if you are sleepy. Follow with Dr. Elsworth Soho in 1 year and as needed

## 2014-11-28 NOTE — Progress Notes (Signed)
Subjective:    Patient ID: Bryan Kirby, male    DOB: January 15, 1956, 59 y.o.   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 Using CPAP nightly. No complaints.  Download shows great compliance with set pressure 8 cm Minimal leaks. AHI 1.7 . avg usage 7 hr .   Pt reports having CT in December, showed nodule in lung.  He was seen in ER with chest pain. CTA chest was neg for PE.  Showed 55mm non calcified pulmonary nodule along right minor fissure.  No hemoptysis, unintentional wt loss, cough or dyspnea.  Remote hx of cig smoking 35 yrs ago , did smoke marajuana for few years as young adults     Past Medical History  Diagnosis Date  . Allergic rhinitis due to pollen   . Hyperlipidemia   . 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 DVT (deep venous thrombosis) 2000    Right leg  . Family history of adverse reaction to anesthesia     " MY MOTHER "   . Sleep apnea     USES CPAP  . GERD (gastroesophageal reflux disease)   . 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 Eye surgery    . Polypectomy      OF PENIS  . Cystectomy    . 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 Knee arthroscopy      LEFT LAT MENISCUS TEAR  . Knee surgery      LEFT KNEE CAP WIRING AND PATELLAR REATTACHMENT  . Carpal tunnel release Right   . Left heart catheterization with coronary angiogram N/A 10/02/2014    Procedure: LEFT HEART CATHETERIZATION WITH CORONARY ANGIOGRAM;  Surgeon: Sanda Klein, MD;  Location: [facility];  Service: Cardiovascular;  Laterality: N/A;    Allergies  Allergen Reactions  . Codeine     REACTION: itching  . Penicillins     REACTION: UNSPECIFIED    History   Social History  . Marital Status: Married    Spouse Name: N/A  . Number of Children: 1  .  Years of Education: N/A   Occupational History  . PLUMBER    Social History Main Topics  . Smoking status: Former Smoker -- 0.50 packs/day for 1 years    Types: Cigarettes    Quit date: 10/03/1973  . Smokeless tobacco: Never Used  . Alcohol Use: No  . Drug Use: No  . Sexual Activity: Not on file   Other Topics Concern  . Not on file   Social History Narrative     Review of Systems Constitutional: negative for anorexia, fevers and sweats  Eyes: negative for irritation, redness and visual disturbance  Ears, nose, mouth, throat, and face: negative for earaches, epistaxis, nasal congestion and sore throat  Respiratory: negative for cough, dyspnea on exertion, sputum and wheezing  Cardiovascular: negative for chest pain, dyspnea, lower extremity edema, orthopnea, palpitations and syncope  Gastrointestinal: negative for abdominal pain, constipation, diarrhea, melena, nausea and vomiting  Genitourinary:negative for dysuria, frequency and hematuria  Hematologic/lymphatic: negative for bleeding, easy bruising and lymphadenopathy  Musculoskeletal:negative for arthralgias, muscle weakness and stiff joints  Neurological: negative for coordination problems, gait problems, headaches and weakness  Endocrine: negative for  diabetic symptoms including polydipsia, polyuria and weight loss     Objective:   Physical Exam  Gen. Pleasant, obese, in no distress, normal affect, obese ENT - no lesions, no post nasal drip, class 2-3 airway Neck: No JVD, no thyromegaly, no carotid bruits Lungs: no use of accessory muscles, no dullness to percussion, decreased without rales or rhonchi  Cardiovascular: Rhythm regular, heart sounds  normal, no murmurs or gallops, no peripheral edema Abdomen: soft and non-tender, no hepatosplenomegaly, BS normal. Musculoskeletal: No deformities, no cyanosis or clubbing Neuro:  alert, non focal, no tremors        Assessment & Plan:

## 2014-11-28 NOTE — Patient Instructions (Signed)
Great job. Using your CPAP , keep up the good work Continue to work on weight loss Do not drive if you are sleepy. We will follow-up a CT chest for lung nodule on the right in 1 year Follow with Dr. Elsworth Soho in 1 year and as needed

## 2014-11-28 NOTE — Assessment & Plan Note (Signed)
Remote smoking briefly for young adult , asymptomatic  Very small nodule ~67mm noted on CT chest 09/2014  Will repeat CT chest in 1 year .  Advised if develops symptoms of unintentional wt loss, dyspnea , cough or hemoptysis will need sooner follow up .

## 2014-12-12 ENCOUNTER — Encounter: Payer: Self-pay | Admitting: Adult Health

## 2015-01-06 ENCOUNTER — Other Ambulatory Visit: Payer: Self-pay | Admitting: Family Medicine

## 2015-03-05 ENCOUNTER — Other Ambulatory Visit: Payer: Self-pay | Admitting: Family Medicine

## 2015-03-05 NOTE — Telephone Encounter (Signed)
Please call and schedule CPE with fasting labs prior with Dr. Copland.  

## 2015-03-06 NOTE — Telephone Encounter (Signed)
Spoke with pt  Pt couldn't talk  He will call back to schedule

## 2015-04-06 ENCOUNTER — Other Ambulatory Visit: Payer: Self-pay | Admitting: Family Medicine

## 2015-04-06 NOTE — Telephone Encounter (Signed)
Last office visit 12/18/2013. No future appointments scheduled.  Refill?

## 2015-04-07 NOTE — Telephone Encounter (Signed)
Please set up CPX, 30 min. Ok to refill 30, 2 ref

## 2015-04-07 NOTE — Telephone Encounter (Signed)
Please call and schedule CPE with fasting labs prior with Dr. Lorelei Pont.  Needs appointment in order to keep getting refills on his medications.

## 2015-06-01 ENCOUNTER — Encounter: Payer: Self-pay | Admitting: *Deleted

## 2015-06-16 ENCOUNTER — Encounter: Payer: Self-pay | Admitting: Gastroenterology

## 2015-06-30 ENCOUNTER — Other Ambulatory Visit: Payer: Self-pay | Admitting: Family Medicine

## 2015-06-30 NOTE — Telephone Encounter (Signed)
Please call and schedule CPE with Dr. Lorelei Pont.

## 2015-06-30 NOTE — Telephone Encounter (Signed)
Ok to ref 44, 2 ref  F/u CPX

## 2015-06-30 NOTE — Telephone Encounter (Signed)
Last office visit 12/18/2013. Last refill states needs an office visit.   No future appointments scheduled.  Refill?

## 2015-07-20 ENCOUNTER — Other Ambulatory Visit: Payer: Self-pay | Admitting: Family Medicine

## 2015-08-31 ENCOUNTER — Other Ambulatory Visit: Payer: Self-pay

## 2015-09-16 ENCOUNTER — Encounter: Payer: Self-pay | Admitting: Family Medicine

## 2015-09-16 ENCOUNTER — Ambulatory Visit (INDEPENDENT_AMBULATORY_CARE_PROVIDER_SITE_OTHER): Payer: Self-pay | Admitting: Family Medicine

## 2015-09-16 VITALS — BP 110/70 | HR 68 | Temp 98.2°F | Ht 71.5 in | Wt 271.0 lb

## 2015-09-16 DIAGNOSIS — Z23 Encounter for immunization: Secondary | ICD-10-CM

## 2015-09-16 DIAGNOSIS — Z125 Encounter for screening for malignant neoplasm of prostate: Secondary | ICD-10-CM

## 2015-09-16 DIAGNOSIS — F3341 Major depressive disorder, recurrent, in partial remission: Secondary | ICD-10-CM

## 2015-09-16 DIAGNOSIS — Z79899 Other long term (current) drug therapy: Secondary | ICD-10-CM

## 2015-09-16 DIAGNOSIS — E785 Hyperlipidemia, unspecified: Secondary | ICD-10-CM

## 2015-09-16 LAB — BASIC METABOLIC PANEL
BUN: 13 mg/dL (ref 6–23)
CALCIUM: 9.2 mg/dL (ref 8.4–10.5)
CO2: 26 mEq/L (ref 19–32)
Chloride: 105 mEq/L (ref 96–112)
Creatinine, Ser: 0.93 mg/dL (ref 0.40–1.50)
GFR: 88.96 mL/min (ref >60.00)
GLUCOSE: 99 mg/dL (ref 70–99)
Potassium: 4.7 mEq/L (ref 3.5–5.1)
SODIUM: 140 meq/L (ref 135–145)

## 2015-09-16 LAB — LIPID PANEL
Cholesterol: 138 mg/dL (ref 0–200)
HDL: 50.2 mg/dL (ref >39.00)
LDL CALC: 75 mg/dL (ref 0–99)
NONHDL: 87.82
Total CHOL/HDL Ratio: 3
Triglycerides: 66 mg/dL (ref 0.0–149.0)
VLDL: 13.2 mg/dL (ref 0.0–40.0)

## 2015-09-16 LAB — PSA: PSA: 0.44 ng/mL (ref 0.10–4.00)

## 2015-09-16 MED ORDER — SIMVASTATIN 40 MG PO TABS: 40.0000 mg | ORAL_TABLET | Freq: Every day | ORAL | Status: DC

## 2015-09-16 MED ORDER — PANTOPRAZOLE SODIUM 40 MG PO TBEC: 40.0000 mg | DELAYED_RELEASE_TABLET | Freq: Every day | ORAL | Status: DC

## 2015-09-16 MED ORDER — ESCITALOPRAM OXALATE 20 MG PO TABS: 20.0000 mg | ORAL_TABLET | Freq: Every day | ORAL | Status: DC

## 2015-09-16 NOTE — Progress Notes (Signed)
Pre visit review using our clinic review tool, if applicable. No additional management support is needed unless otherwise documented below in the visit note. 

## 2015-09-16 NOTE — Progress Notes (Signed)
Dr. Frederico Hamman T. Laportia Carley, MD, [facility] [place], [address] Phone: [phone] Fax: [phone]  09/16/2015  Patient: Bryan Kirby, MRN: [id number], DOB: [birth year], 59 y.o.  Primary Physician:  Owens Loffler, MD   Chief Complaint  Patient presents with  . Medication Refill   Subjective:   JAHAIR ESCANO is a 59 y.o. very pleasant male patient who presents with the following:  Needs chol and dep meds refilled. Stable and doing well.  Wt Readings from Last 3 Encounters:  09/16/15 271 lb (122.925 kg)  11/28/14 288 lb 9.6 oz (130.908 kg)  10/02/14 282 lb 8 oz (128.141 kg)      Past Medical History, Surgical History, Social History, Family History, Problem List, Medications, and Allergies have been reviewed and updated if relevant.  Patient Active Problem List   Diagnosis Date Noted  . Abnormal nuclear cardiac imaging test   . Lung nodule 09/30/2014  . Chest pain, moderate coronary artery risk   . Morbid obesity ([place])   . OSA (obstructive sleep apnea) 12/20/2013  . Obesity (BMI 30-39.9) 08/27/2013  . FATIGUE 05/24/2010  . Major depression in partial remission ([place]) 03/16/2007  . HLD (hyperlipidemia) 03/15/2007  . Allergic rhinitis 03/15/2007  . 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 Allergic rhinitis due to pollen   . Hyperlipidemia   . Major depression in partial remission ([place]) 03/16/2007    Qualifier: Diagnosis of  By: Council Mechanic MD, Hilaria Ota   . DVT (deep venous thrombosis) ([place]) 2000    Right leg  . Family history of adverse reaction to anesthesia     " MY MOTHER "   . Sleep apnea     USES CPAP  . GERD (gastroesophageal reflux disease)   . 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 Colon polyps     Past Surgical History  Procedure Laterality Date  . Eye surgery    . Polypectomy      OF PENIS  . Cystectomy    . Orchiectomy      MALDEVELOPEMENT AFTER  ORCHITIS  . Knee arthroscopy      LEFT LAT MENISCUS TEAR  . Knee surgery      LEFT KNEE CAP WIRING AND PATELLAR REATTACHMENT  . Carpal tunnel release Right   . Left heart catheterization with coronary angiogram N/A 10/02/2014    Procedure: LEFT HEART CATHETERIZATION WITH CORONARY ANGIOGRAM;  Surgeon: Sanda Klein, MD;  Location: [facility];  Service: Cardiovascular;  Laterality: N/A;    Social History   Social History  . Marital Status: Married    Spouse Name: N/A  . Number of Children: 1  . Years of Education: N/A   Occupational History  . PLUMBER    Social History Main Topics  . Smoking status: Former Smoker -- 0.50 packs/day for 1 years    Types: Cigarettes    Quit date: 10/03/1973  . Smokeless tobacco: Never Used  . Alcohol Use: No  . Drug Use: No  . Sexual Activity: Not on file   Other Topics Concern  . Not on file   Social History Narrative    Family History  Problem Relation Age of Onset  . Fibromyalgia Mother   . Depression Mother   . Cancer Father     PROSTATECTOMY  . Stroke Father   . Cancer Sister     CERVICAL  .  Stroke Paternal Grandfather   . Hypertension Paternal Grandfather   . Diabetes Paternal Grandfather     Allergies  Allergen Reactions  . Acetaminophen Other (See Comments)    Make Prostate Swell  . Codeine     REACTION: itching  . Penicillins     REACTION: UNSPECIFIED    Medication list reviewed and updated in full in [place].   GEN: No acute illnesses, no fevers, chills. GI: No n/v/d, eating normally Pulm: No SOB Interactive and getting along well at home.  Otherwise, ROS is as per the HPI.  Objective:   BP 110/70 mmHg  Pulse 68  Temp(Src) 98.2 F (36.8 C) (Oral)  Ht 5' 11.5" (1.816 m)  Wt 271 lb (122.925 kg)  BMI 37.27 kg/m2  GEN: WDWN, NAD, Non-toxic, A & O x 3 HEENT: Atraumatic, Normocephalic. Neck supple. No masses, No LAD. Ears and Nose: No external deformity. CV: RRR, No M/G/R. No JVD. No thrill.  No extra heart sounds. PULM: CTA B, no wheezes, crackles, rhonchi. No retractions. No resp. distress. No accessory muscle use. EXTR: No c/c/e NEURO Normal gait.  PSYCH: Normally interactive. Conversant. Not depressed or anxious appearing.  Calm demeanor.   Laboratory and Imaging Data:  Assessment and Plan:   HLD (hyperlipidemia) - Plan: Lipid panel  Need for prophylactic vaccination and inoculation against influenza - Plan: Flu Vaccine QUAD 36+ mos IM  Recurrent major depressive disorder, in partial remission (HCC)  Screening PSA (prostate specific antigen) - Plan: PSA  Encounter for long-term (current) use of medications - Plan: Basic metabolic panel  Ref med  Follow-up: No Follow-up on file.  New Prescriptions   No medications on file   Modified Medications   Modified Medication Previous Medication   ESCITALOPRAM (LEXAPRO) 20 MG TABLET escitalopram (LEXAPRO) 20 MG tablet      Take 1 tablet (20 mg total) by mouth daily.    TAKE 1 TABLET BY MOUTH DAILY *NEED TO MAKE APPT*   PANTOPRAZOLE (PROTONIX) 40 MG TABLET pantoprazole (PROTONIX) 40 MG tablet      Take 1 tablet (40 mg total) by mouth daily.    TAKE 1 TABLET BY MOUTH EACH MORNING 30 MINUTES BEFORE BREAKFAST   SIMVASTATIN (ZOCOR) 40 MG TABLET simvastatin (ZOCOR) 40 MG tablet      Take 1 tablet (40 mg total) by mouth at bedtime.    TAKE ONE TABLET BY MOUTH EVERY NIGHT AT BEDTIME   Orders Placed This Encounter  Procedures  . Flu Vaccine QUAD 36+ mos IM  . Lipid panel  . Basic metabolic panel  . PSA    Signed,  Frederico Hamman T. Nataya Bastedo, MD   Patient's Medications  New Prescriptions   No medications on file  Previous Medications   ASPIRIN 81 MG TABLET    Take 81 mg by mouth daily.  Modified Medications   Modified Medication Previous Medication   ESCITALOPRAM (LEXAPRO) 20 MG TABLET escitalopram (LEXAPRO) 20 MG tablet      Take 1 tablet (20 mg total) by mouth daily.    TAKE 1 TABLET BY MOUTH DAILY *NEED TO MAKE APPT*    PANTOPRAZOLE (PROTONIX) 40 MG TABLET pantoprazole (PROTONIX) 40 MG tablet      Take 1 tablet (40 mg total) by mouth daily.    TAKE 1 TABLET BY MOUTH EACH MORNING 30 MINUTES BEFORE BREAKFAST   SIMVASTATIN (ZOCOR) 40 MG TABLET simvastatin (ZOCOR) 40 MG tablet      Take 1 tablet (40 mg total) by mouth at  bedtime.    TAKE ONE TABLET BY MOUTH EVERY NIGHT AT BEDTIME  Discontinued Medications   No medications on file

## 2015-09-17 ENCOUNTER — Encounter: Payer: Self-pay | Admitting: *Deleted

## 2015-10-12 ENCOUNTER — Inpatient Hospital Stay: Admission: RE | Admit: 2015-10-12 | Payer: BLUE CROSS/BLUE SHIELD | Source: Ambulatory Visit

## 2015-10-19 ENCOUNTER — Ambulatory Visit (INDEPENDENT_AMBULATORY_CARE_PROVIDER_SITE_OTHER): Payer: Self-pay | Admitting: Family Medicine

## 2015-10-19 ENCOUNTER — Encounter: Payer: Self-pay | Admitting: Family Medicine

## 2015-10-19 VITALS — BP 124/80 | HR 67 | Temp 98.4°F | Ht 71.5 in | Wt 274.8 lb

## 2015-10-19 DIAGNOSIS — H10023 Other mucopurulent conjunctivitis, bilateral: Secondary | ICD-10-CM

## 2015-10-19 DIAGNOSIS — J069 Acute upper respiratory infection, unspecified: Secondary | ICD-10-CM

## 2015-10-19 MED ORDER — POLYMYXIN B-TRIMETHOPRIM 10000-0.1 UNIT/ML-% OP SOLN: 1.0000 [drp] | OPHTHALMIC | Status: DC

## 2015-10-19 NOTE — Progress Notes (Signed)
   Dr. Frederico Hamman T. Matha Masse, MD, [facility] [place], [address] Phone: [phone] Fax: [phone]  10/19/2015  Patient: Bryan Kirby, MRN: [id number], DOB: 1956/01/06, 60 y.o.  Primary Physician:  Owens Loffler, MD   Chief Complaint  Patient presents with  . Eye Pain    watery eyes--left eye pain  . Nasal Congestion    3 to 4 days--no sore throat--no headache--no fever--no N/V   Subjective:   This 60 y.o. male patient presents with runny nose, sneezing, cough, sore throat, malaise and minimal / low-grade fever .   ? recent exposure to others with similar symptoms.  B pink eye - started on the left and on the right.   The patent denies sore throat as the primary complaint. Denies sthortness of breath/wheezing, high fever, chest pain, rhinits for more than 14 days, significant myalgia, otalgia, facial pain, abdominal pain, changes in bowel or bladder.  PMH, PHS, Allergies, Problem List, Medications, Family History, and Social History have all been reviewed.  Medication list reviewed and updated in full in [place].  ROS as above, eating and drinking - tolerating PO. Urinating normally. No excessive vomitting or diarrhea. O/w as above.  Objective:   Blood pressure 124/80, pulse 67, temperature 98.4 F (36.9 C), temperature source Oral, height 5' 11.5" (1.816 m), weight 274 lb 12.8 oz (124.648 kg), SpO2 99 %.  GEN: WDWN, Non-toxic, Atraumatic, normocephalic. A and O x 3. HEENT: Oropharynx clear without exudate, MMM, no significant LAD, mild rhinnorhea. b pink conjunctiva Ears: TM clear, COL visualized with good landmarks CV: RRR, no m/g/r. Pulm: CTA B, no wheezes, rhonchi, or crackles, normal respiratory effort. EXT: no c/c/e Psych: well oriented, neither depressed nor anxious in appearance  Objective Data:  Assessment and Plan:   Pink eye disease of both eyes  URI (upper respiratory  infection)  Supportive care reviewed with patient. See patient instruction section. Pink eye  Follow-up: No Follow-up on file.  New Prescriptions   TRIMETHOPRIM-POLYMYXIN B (POLYTRIM) OPHTHALMIC SOLUTION    Place 1 drop into both eyes every 4 (four) hours. For 1 week   Modified Medications   No medications on file   No orders of the defined types were placed in this encounter.    Signed,  Maud Deed. Ameria Sanjurjo, MD   Patient's Medications  New Prescriptions   TRIMETHOPRIM-POLYMYXIN B (POLYTRIM) OPHTHALMIC SOLUTION    Place 1 drop into both eyes every 4 (four) hours. For 1 week  Previous Medications   ASPIRIN 81 MG TABLET    Take 81 mg by mouth daily.   ESCITALOPRAM (LEXAPRO) 20 MG TABLET    Take 1 tablet (20 mg total) by mouth daily.   PANTOPRAZOLE (PROTONIX) 40 MG TABLET    Take 1 tablet (40 mg total) by mouth daily.   SIMVASTATIN (ZOCOR) 40 MG TABLET    Take 1 tablet (40 mg total) by mouth at bedtime.  Modified Medications   No medications on file  Discontinued Medications   No medications on file

## 2015-10-19 NOTE — Progress Notes (Signed)
Pre visit review using our clinic review tool, if applicable. No additional management support is needed unless otherwise documented below in the visit note. 

## 2015-10-27 ENCOUNTER — Telehealth: Payer: Self-pay

## 2015-10-27 NOTE — Telephone Encounter (Signed)
Pt left v/m; pt seen 10/19/15 with pink eye; pt was improved but as of 10/26/15 pt having a lot of itching, redness and watering of eyes. Pt request different med to [facility]. Pt request cb when med called in.

## 2015-10-27 NOTE — Telephone Encounter (Signed)
i think his wife works with an eye doctor -- he could always have them check it, too. But i want someone to look.

## 2015-10-27 NOTE — Telephone Encounter (Signed)
This would be fairly unusual - concerning that something else has happened to his eye. My biggest worry would be that he could have scratched his eye, which can happen if you scratch with pink eye.   Recommend f/u eye eval - today would be preferable if availability or with me tomorrow if none.

## 2015-10-27 NOTE — Telephone Encounter (Signed)
Mr. Heiman notified as instructed by telephone.  Appointment scheduled for 10/28/2015 at 9:30 am with Dr. Lorelei Pont.

## 2015-10-28 ENCOUNTER — Ambulatory Visit: Payer: Self-pay | Admitting: Family Medicine

## 2015-10-29 ENCOUNTER — Telehealth: Payer: Self-pay | Admitting: Adult Health

## 2015-10-29 NOTE — Telephone Encounter (Signed)
Patient was scheduled for a 1 year follow up CT on 10/12/15 but canceled and did not reschedule. LVM for patient to return call to discuss rescheduling CT.

## 2015-10-30 NOTE — Telephone Encounter (Signed)
Okay , ok to call us back when he would like to do as  He need CT to follow this , former smoker

## 2015-10-30 NOTE — Telephone Encounter (Signed)
Patient returned call. He states that he no longer has insurance and does not know when he will be able to afford insurance again. He states he will not be able to reschedule his CT but will call us back when he is able to afford insurance. I explained to the patient that I would inform TP of the situation. He voiced understanding and had no further questions.   Will send message to TP as FYI.

## 2015-10-30 NOTE — Telephone Encounter (Signed)
LMTcb for pt

## 2015-10-30 NOTE — Telephone Encounter (Signed)
LVM for patient to return call. 

## 2015-11-02 NOTE — Telephone Encounter (Signed)
Patient has been made aware. Nothing further needed at this time. 

## 2015-12-22 ENCOUNTER — Ambulatory Visit: Payer: BLUE CROSS/BLUE SHIELD | Admitting: Pulmonary Disease

## 2016-09-09 ENCOUNTER — Other Ambulatory Visit: Payer: Self-pay

## 2016-09-09 MED ORDER — ESCITALOPRAM OXALATE 20 MG PO TABS: 20.0000 mg | ORAL_TABLET | Freq: Every day | ORAL | 0 refills | Status: DC

## 2016-09-09 MED ORDER — SIMVASTATIN 40 MG PO TABS
40.0000 mg | ORAL_TABLET | Freq: Every day | ORAL | 0 refills | Status: DC
Start: 2016-09-09 — End: 2016-11-23

## 2016-09-09 MED ORDER — PANTOPRAZOLE SODIUM 40 MG PO TBEC: 40.0000 mg | DELAYED_RELEASE_TABLET | Freq: Every day | ORAL | 0 refills | Status: DC

## 2016-09-09 NOTE — Telephone Encounter (Signed)
Pt request 3 month rx for lexapro, pantoprazole and simvastatin to [facility]. Pt will call for f/u appt before needs another refill. Refill done per protocol.

## 2016-10-12 ENCOUNTER — Telehealth: Payer: Self-pay | Admitting: *Deleted

## 2016-10-12 MED ORDER — OSELTAMIVIR PHOSPHATE 75 MG PO CAPS: 75.0000 mg | ORAL_CAPSULE | Freq: Every day | ORAL | 0 refills | Status: DC

## 2016-10-12 NOTE — Telephone Encounter (Signed)
Pt calls stating he has been exposed to the flu. His wife Loma Boston was dx with flu 2 days ago ( at [facility]) and his father is in the hosp from the flu. He c/o intermittent fever, chills, body aches x 24 hours. Request rx for Tamiflu.

## 2016-10-12 NOTE — Telephone Encounter (Signed)
Mr. Sokolski notified by telephone that Tamiflu has been sent into [facility].

## 2016-10-12 NOTE — Telephone Encounter (Signed)
I am ok with this.  Tamiflu 75 mg, 1 po bid, #10, 0 refills

## 2016-11-11 ENCOUNTER — Other Ambulatory Visit: Payer: Self-pay | Admitting: Family Medicine

## 2016-11-11 DIAGNOSIS — Z125 Encounter for screening for malignant neoplasm of prostate: Secondary | ICD-10-CM

## 2016-11-11 DIAGNOSIS — Z79899 Other long term (current) drug therapy: Secondary | ICD-10-CM

## 2016-11-11 DIAGNOSIS — E7849 Other hyperlipidemia: Secondary | ICD-10-CM

## 2016-11-11 DIAGNOSIS — Z1159 Encounter for screening for other viral diseases: Secondary | ICD-10-CM

## 2016-11-11 DIAGNOSIS — Z114 Encounter for screening for human immunodeficiency virus [HIV]: Secondary | ICD-10-CM

## 2016-11-14 ENCOUNTER — Other Ambulatory Visit (INDEPENDENT_AMBULATORY_CARE_PROVIDER_SITE_OTHER): Payer: Self-pay

## 2016-11-14 DIAGNOSIS — E784 Other hyperlipidemia: Secondary | ICD-10-CM

## 2016-11-14 DIAGNOSIS — Z125 Encounter for screening for malignant neoplasm of prostate: Secondary | ICD-10-CM

## 2016-11-14 DIAGNOSIS — E7849 Other hyperlipidemia: Secondary | ICD-10-CM

## 2016-11-14 DIAGNOSIS — Z79899 Other long term (current) drug therapy: Secondary | ICD-10-CM

## 2016-11-14 DIAGNOSIS — Z114 Encounter for screening for human immunodeficiency virus [HIV]: Secondary | ICD-10-CM

## 2016-11-14 DIAGNOSIS — Z1159 Encounter for screening for other viral diseases: Secondary | ICD-10-CM

## 2016-11-14 LAB — LIPID PANEL
CHOLESTEROL: 167 mg/dL (ref 0–200)
HDL: 44.4 mg/dL (ref >39.00)
LDL CALC: 91 mg/dL (ref 0–99)
NonHDL: 122.22
Total CHOL/HDL Ratio: 4
Triglycerides: 155 mg/dL — ABNORMAL HIGH (ref 0.0–149.0)
VLDL: 31 mg/dL (ref 0.0–40.0)

## 2016-11-14 LAB — CBC WITH DIFFERENTIAL/PLATELET
Basophils Absolute: 0 10*3/uL (ref 0.0–0.1)
Basophils Relative: 0.5 % (ref 0.0–3.0)
Eosinophils Absolute: 0.1 10*3/uL (ref 0.0–0.7)
Eosinophils Relative: 2 % (ref 0.0–5.0)
HCT: 45.2 % (ref 39.0–52.0)
Hemoglobin: 15.3 g/dL (ref 13.0–17.0)
Lymphocytes Relative: 22.8 % (ref 12.0–46.0)
Lymphs Abs: 1.3 10*3/uL (ref 0.7–4.0)
MCHC: 33.8 g/dL (ref 30.0–36.0)
MCV: 94.5 fl (ref 78.0–100.0)
Monocytes Absolute: 0.7 10*3/uL (ref 0.1–1.0)
Monocytes Relative: 11.8 % (ref 3.0–12.0)
NEUTROS ABS: 3.6 10*3/uL (ref 1.4–7.7)
NEUTROS PCT: 62.9 % (ref 43.0–77.0)
PLATELETS: 236 10*3/uL (ref 150.0–400.0)
RBC: 4.78 Mil/uL (ref 4.22–5.81)
RDW: 13.5 % (ref 11.5–15.5)
WBC: 5.7 10*3/uL (ref 4.0–10.5)

## 2016-11-14 LAB — PSA: PSA: 0.45 ng/mL (ref 0.10–4.00)

## 2016-11-14 LAB — BASIC METABOLIC PANEL
BUN: 15 mg/dL (ref 6–23)
CO2: 30 mEq/L (ref 19–32)
Calcium: 9.2 mg/dL (ref 8.4–10.5)
Chloride: 104 mEq/L (ref 96–112)
Creatinine, Ser: 0.99 mg/dL (ref 0.40–1.50)
GFR: 82.43 mL/min (ref >60.00)
GLUCOSE: 106 mg/dL — AB (ref 70–99)
POTASSIUM: 4.6 meq/L (ref 3.5–5.1)
Sodium: 140 mEq/L (ref 135–145)

## 2016-11-14 LAB — HEPATIC FUNCTION PANEL
ALT: 24 U/L (ref 0–53)
AST: 27 U/L (ref 0–37)
Albumin: 4.1 g/dL (ref 3.5–5.2)
Alkaline Phosphatase: 53 U/L (ref 39–117)
BILIRUBIN DIRECT: 0.1 mg/dL (ref 0.0–0.3)
Total Bilirubin: 0.4 mg/dL (ref 0.2–1.2)
Total Protein: 6.4 g/dL (ref 6.0–8.3)

## 2016-11-15 LAB — HEPATITIS C ANTIBODY: HCV Ab: NEGATIVE

## 2016-11-15 LAB — HIV ANTIBODY (ROUTINE TESTING W REFLEX): HIV 1&2 Ab, 4th Generation: NONREACTIVE

## 2016-11-23 ENCOUNTER — Encounter: Payer: Self-pay | Admitting: Family Medicine

## 2016-11-23 ENCOUNTER — Ambulatory Visit (INDEPENDENT_AMBULATORY_CARE_PROVIDER_SITE_OTHER): Payer: Self-pay | Admitting: Family Medicine

## 2016-11-23 VITALS — BP 120/80 | HR 90 | Temp 98.9°F | Ht 71.75 in | Wt 279.0 lb

## 2016-11-23 DIAGNOSIS — Z0000 Encounter for general adult medical examination without abnormal findings: Secondary | ICD-10-CM

## 2016-11-23 NOTE — Progress Notes (Signed)
Dr. Frederico Hamman T. Copland, MD, [facility] [place], [address] Phone: [phone] Fax: [phone]  11/23/2016  Patient: Bryan Kirby, MRN: [id number], DOB: Feb 14, 1956, 61 y.o.  Primary Physician:  Owens Loffler, MD   Chief Complaint  Patient presents with  . Annual Exam   Subjective:   Bryan Kirby is a 61 y.o. pleasant patient who presents with the following:  Preventative Health Maintenance Visit:  Health Maintenance Summary Reviewed and updated, unless pt declines services.  Tobacco History Reviewed. Alcohol: No concerns, no excessive use Exercise Habits: Minimal, rec at least 30 mins 5 times a week STD concerns: no risk or activity to increase risk Drug Use: None Encouraged self-testicular check  Somewhat a break in the last couple of weeks On edge when around him Has been stressed out a lot recently  Health Maintenance  Topic Date Due  . INFLUENZA VACCINE  12/31/2016 (Originally 05/03/2016)  . COLONOSCOPY  07/23/2020  . TETANUS/TDAP  04/09/2023  . Hepatitis C Screening  Completed  . HIV Screening  Completed   Immunization History  Administered Date(s) Administered  . Influenza Split 08/22/2012  . Influenza,inj,Quad PF,36+ Mos 08/27/2013, 07/01/2014, 09/16/2015  . Td 05/10/1999  . Tdap 04/08/2013   Patient Active Problem List   Diagnosis Date Noted  . Lung nodule 09/30/2014  . Chest pain, moderate coronary artery risk   . OSA (obstructive sleep apnea) 12/20/2013  . Obesity (BMI 30-39.9) 08/27/2013  . Major depression in partial remission ([facility]) 03/16/2007  . HLD (hyperlipidemia) 03/15/2007  . Allergic rhinitis 03/15/2007  . HERNIATED DISC  L1/2 R SIDE 03/15/2007   Past Medical History:  Diagnosis Date  . Allergic rhinitis due to pollen   . Colon polyps   . DVT (deep venous thrombosis) ([facility]) 2000   Right leg  . Family history of adverse reaction to anesthesia    " MY MOTHER "   .  GERD (gastroesophageal reflux disease)   . Hyperlipidemia   . Major depression in partial remission ([facility]) 03/16/2007   Qualifier: Diagnosis of  By: Council Mechanic MD, Hilaria Ota   . Sleep apnea    USES CPAP   Past Surgical History:  Procedure Laterality Date  . CARPAL TUNNEL RELEASE Right   . CYSTECTOMY    . EYE SURGERY    . KNEE ARTHROSCOPY     LEFT LAT MENISCUS TEAR  . KNEE SURGERY     LEFT KNEE CAP WIRING AND PATELLAR REATTACHMENT  . LEFT HEART CATHETERIZATION WITH CORONARY ANGIOGRAM N/A 10/02/2014   Procedure: LEFT HEART CATHETERIZATION WITH CORONARY ANGIOGRAM;  Surgeon: Sanda Klein, MD;  Location: [facility]ATH [facility];  Service: Cardiovascular;  Laterality: N/A;  . ORCHIECTOMY     MALDEVELOPEMENT AFTER ORCHITIS  . POLYPECTOMY     OF PENIS   Social History   Social History  . Marital status: Married    Spouse name: N/A  . Number of children: 1  . Years of education: N/A   Occupational History  . PLUMBER    Social History Main Topics  . Smoking status: Former Smoker    Packs/day: 0.50    Years: 1.00    Types: Cigarettes    Quit date: 10/03/1973  . Smokeless tobacco: Never Used  . Alcohol use No  . Drug use: No  . Sexual activity: Not on file   Other Topics Concern  . Not on file   Social History Narrative  . No narrative on  file   Family History  Problem Relation Age of Onset  . Fibromyalgia Mother   . Depression Mother   . Cancer Father     PROSTATECTOMY  . Stroke Father   . Cancer Sister     CERVICAL  . Stroke Paternal Grandfather   . Hypertension Paternal Grandfather   . Diabetes Paternal Grandfather    Allergies  Allergen Reactions  . Codeine     REACTION: itching  . Penicillins     REACTION: UNSPECIFIED    Medication list has been reviewed and updated.   General: Denies fever, chills, sweats. No significant weight loss. Eyes: Denies blurring,significant itching ENT: Denies earache, sore throat, and hoarseness. Cardiovascular: Denies chest pains,  palpitations, dyspnea on exertion Respiratory: Denies cough, dyspnea at rest,wheeezing Breast: no concerns about lumps GI: Denies nausea, vomiting, diarrhea, constipation, change in bowel habits, abdominal pain, melena, hematochezia GU: Denies penile discharge, ED, urinary flow / outflow problems. No STD concerns. Musculoskeletal: Denies back pain, joint pain Derm: Denies rash, itching Neuro: Denies  paresthesias, frequent falls, frequent headaches Psych: Denies depression, anxiety Endocrine: Denies cold intolerance, heat intolerance, polydipsia Heme: Denies enlarged lymph nodes Allergy: No hayfever  Objective:   BP 120/80   Pulse 90   Temp 98.9 F (37.2 C) (Oral)   Ht 5' 11.75" (1.822 m)   Wt 279 lb (126.6 kg)   BMI 38.10 kg/m  Ideal Body Weight: Weight in (lb) to have BMI = 25: 182.7  No exam data present  GEN: well developed, well nourished, no acute distress Eyes: conjunctiva and lids normal, PERRLA, EOMI ENT: TM clear, nares clear, oral exam WNL Neck: supple, no lymphadenopathy, no thyromegaly, no JVD Pulm: clear to auscultation and percussion, respiratory effort normal CV: regular rate and rhythm, S1-S2, no murmur, rub or gallop, no bruits, peripheral pulses normal and symmetric, no cyanosis, clubbing, edema or varicosities GI: soft, non-tender; no hepatosplenomegaly, masses; active bowel sounds all quadrants GU: no hernia, testicular mass, penile discharge Lymph: no cervical, axillary or inguinal adenopathy MSK: gait normal, muscle tone and strength WNL, no joint swelling, effusions, discoloration, crepitus  SKIN: clear, good turgor, color WNL, no rashes, lesions, or ulcerations Neuro: normal mental status, normal strength, sensation, and motion Psych: alert; oriented to person, place and time, normally interactive and not anxious or depressed in appearance. All labs reviewed with patient.  Lipids:    Component Value Date/Time   CHOL 167 11/14/2016 0851   TRIG  155.0 (H) 11/14/2016 0851   HDL 44.40 11/14/2016 0851   LDLDIRECT 92.8 08/27/2013 0923   VLDL 31.0 11/14/2016 0851   CHOLHDL 4 11/14/2016 0851   CBC: CBC Latest Ref Rng & Units 11/14/2016 09/30/2014 09/29/2014  WBC 4.0 - 10.5 K/uL 5.7 5.3 7.3  Hemoglobin 13.0 - 17.0 g/dL 15.3 15.0 15.7  Hematocrit 39.0 - 52.0 % 45.2 44.9 46.8  Platelets 150.0 - 400.0 K/uL 236.0 240 262    Basic Metabolic Panel:    Component Value Date/Time   NA 140 11/14/2016 0851   K 4.6 11/14/2016 0851   CL 104 11/14/2016 0851   CO2 30 11/14/2016 0851   BUN 15 11/14/2016 0851   CREATININE 0.99 11/14/2016 0851   GLUCOSE 106 (H) 11/14/2016 0851   CALCIUM 9.2 11/14/2016 0851   Hepatic Function Latest Ref Rng & Units 11/14/2016 09/30/2014 08/27/2013  Total Protein 6.0 - 8.3 g/dL 6.4 6.3 6.7  Albumin 3.5 - 5.2 g/dL 4.1 4.0 3.9  AST 0 - 37 U/L 27 31 24  ALT 0 - 53 U/L _0 Alk Phosphatase 39 - 117 U/L 53 52 52  Total Bilirubin 0.2 - 1.2 mg/dL 0.4 0.7 0.5  Bilirubin, Direct 0.0 - 0.3 mg/dL 0.1 - 0.1    Lab Results  Component Value Date   TSH 1.85 05/06/2009   Lab Results  Component Value Date   PSA 0.45 11/14/2016   PSA 0.44 09/16/2015   PSA 0.44 08/27/2013    Assessment and Plan:   Healthcare maintenance  Primary issues is weight.  Health Maintenance Exam: The patient's preventative maintenance and recommended screening tests for an annual wellness exam were reviewed in full today. Brought up to date unless services declined.  Counselled on the importance of diet, exercise, and its role in overall health and mortality. The patient's FH and SH was reviewed, including their home life, tobacco status, and drug and alcohol status.  Follow-up in 1 year for physical exam or additional follow-up below.  Follow-up: No Follow-up on file. Or follow-up in 1 year if not noted.  Medications Discontinued During This Encounter  Medication Reason  . oseltamivir (TAMIFLU) 75 MG capsule Completed Course   . trimethoprim-polymyxin b (POLYTRIM) ophthalmic solution Completed Course    Signed,  Frederico Hamman T. Copland, MD   Allergies as of 11/23/2016      Reactions   Codeine    REACTION: itching   Penicillins    REACTION: UNSPECIFIED      Medication List       Accurate as of 11/23/16 11:59 PM. Always use your most recent med list.          aspirin 81 MG tablet Take 81 mg by mouth daily.   escitalopram 20 MG tablet Commonly known as:  LEXAPRO Take 1 tablet (20 mg total) by mouth daily.   pantoprazole 40 MG tablet Commonly known as:  PROTONIX Take 1 tablet (40 mg total) by mouth daily.   simvastatin 40 MG tablet Commonly known as:  ZOCOR Take 1 tablet (40 mg total) by mouth at bedtime.

## 2016-11-23 NOTE — Progress Notes (Signed)
Pre visit review using our clinic review tool, if applicable. No additional management support is needed unless otherwise documented below in the visit note. 

## 2016-11-27 MED ORDER — PANTOPRAZOLE SODIUM 40 MG PO TBEC: 40.0000 mg | DELAYED_RELEASE_TABLET | Freq: Every day | ORAL | 1 refills | Status: DC

## 2016-11-27 MED ORDER — SIMVASTATIN 40 MG PO TABS: 40.0000 mg | ORAL_TABLET | Freq: Every day | ORAL | 1 refills | Status: DC

## 2016-11-28 ENCOUNTER — Other Ambulatory Visit: Payer: Self-pay | Admitting: Family Medicine

## 2017-03-13 ENCOUNTER — Telehealth: Payer: Self-pay

## 2017-03-13 NOTE — Telephone Encounter (Signed)
Pt received cut by piece of metal last week; did not require stitches and now healing with no signs of infection. Pt wanted to know last tetanus shot. Advised pt tdap given on 04/08/13. Pt said should be OK and I advised tdap usually good for 10 years unless cut or dog bite etc and then need to have q 5 years. Pt voiced understanding and will cb if needed. FYI to Dr Lorelei Pont.

## 2017-06-07 ENCOUNTER — Other Ambulatory Visit: Payer: Self-pay | Admitting: Family Medicine

## 2017-06-07 NOTE — Telephone Encounter (Signed)
Pt request cb about status of escitalopram refill to [facility]. Advised pt already refilled and pt will ck with [facility] for pick up.

## 2017-08-08 ENCOUNTER — Ambulatory Visit (INDEPENDENT_AMBULATORY_CARE_PROVIDER_SITE_OTHER): Payer: PRIVATE HEALTH INSURANCE | Admitting: Orthopaedic Surgery

## 2017-08-08 ENCOUNTER — Encounter (INDEPENDENT_AMBULATORY_CARE_PROVIDER_SITE_OTHER): Payer: Self-pay | Admitting: Orthopaedic Surgery

## 2017-08-08 DIAGNOSIS — M25561 Pain in right knee: Secondary | ICD-10-CM

## 2017-08-08 NOTE — Progress Notes (Signed)
Office Visit Note   Patient: Bryan Kirby           Date of Birth: 04-17-1956           MRN: [id number] Visit Date: 08/08/2017              Requested by: Owens Loffler, MD [place], [place] [address] PCP: Owens Loffler, MD   Assessment & Plan: Visit Diagnoses:  1. Acute pain of right knee   History of osteoarthritis right knee by prior evaluation and x-rays. Has had an acute exacerbation. Presently feeling "better"  Plan: Long discussion regarding present problem. I think that this is nothing more than an exacerbation of his prior arthritis in the medial compartment. He has Voltaren gel. He has a pullover knee support for a cortisone has not worked very well for him in the recent past so we'll forego that. Have discussed MRI scan for further evaluation but for the moment he sees comfortable and happy just wanted some reassurance  Follow-Up Instructions: Return if symptoms worsen or fail to improve.   Orders:  No orders of the defined types were placed in this encounter.  No orders of the defined types were placed in this encounter.     Procedures: No procedures performed   Clinical Data: No additional findings.   Subjective: Chief Complaint  Patient presents with  . Right Knee - Pain, Edema, Weakness    Bryan Kirby is a 61 y o here for Right knee pain x 2 weeks. He was working on a floor, not wearing knee pads,stood up and twisted his knee.Edema,pain , no numbness or calf pain.  [name] recently twisted his right knee with onset of medial joint pain. He's not had any effusion or ecchymosis. He's had some popping and clicking that is better now in the last several days he's just concerned about the implication of the knee problem. He's had prior cortisone injection and even Visco supplementation for the osteoarthritis. His knee is not giving way  HPI  Review of Systems  Constitutional: Negative for fatigue.  HENT: Negative for hearing loss.     Respiratory: Positive for apnea and shortness of breath. Negative for chest tightness.   Cardiovascular: Negative for chest pain, palpitations and leg swelling.  Gastrointestinal: Negative for blood in stool, constipation and diarrhea.  Genitourinary: Negative for difficulty urinating.  Musculoskeletal: Positive for back pain and neck pain. Negative for arthralgias, joint swelling, myalgias and neck stiffness.  Neurological: Negative for weakness, numbness and headaches.  Hematological: Does not bruise/bleed easily.  Psychiatric/Behavioral: Negative for sleep disturbance. The patient is not nervous/anxious.      Objective: Vital Signs: There were no vitals taken for this visit.  Physical Exam  Ortho Exam awake alert and oriented 3. Comfortable sitting. Right knee without effusion. Knee was not hot red or swollen. No calf pain. No distal edema. Neurovascular exam intact. Full knee extension. No effusion. Mild mid to anterior medial joint pain. None posteriorly. No popping or clicking. No patella pain.  No specialty comments available.  Imaging: No results found.   PMFS History: Patient Active Problem List   Diagnosis Date Noted  . Lung nodule 09/30/2014  . Chest pain, moderate coronary artery risk   . OSA (obstructive sleep apnea) 12/20/2013  . Obesity (BMI 30-39.9) 08/27/2013  . Major depression in partial remission ([facility]) 03/16/2007  . HLD (hyperlipidemia) 03/15/2007  . Allergic rhinitis 03/15/2007  . HERNIATED DISC  L1/2 R SIDE 03/15/2007  Past Medical History:  Diagnosis Date  . Allergic rhinitis due to pollen   . Colon polyps   . DVT (deep venous thrombosis) ([facility]) 2000   Right leg  . Family history of adverse reaction to anesthesia    " MY MOTHER "   . GERD (gastroesophageal reflux disease)   . Hyperlipidemia   . Major depression in partial remission ([facility]) 03/16/2007   Qualifier: Diagnosis of  By: Council Mechanic MD, Hilaria Ota   . Sleep apnea    USES CPAP    Family  History  Problem Relation Age of Onset  . Fibromyalgia Mother   . Depression Mother   . Cancer Father        PROSTATECTOMY  . Stroke Father   . Cancer Sister        CERVICAL  . Stroke Paternal Grandfather   . Hypertension Paternal Grandfather   . Diabetes Paternal Grandfather     Past Surgical History:  Procedure Laterality Date  . CARPAL TUNNEL RELEASE Right   . CYSTECTOMY    . EYE SURGERY    . KNEE ARTHROSCOPY     LEFT LAT MENISCUS TEAR  . KNEE SURGERY     LEFT KNEE CAP WIRING AND PATELLAR REATTACHMENT  . ORCHIECTOMY     MALDEVELOPEMENT AFTER ORCHITIS  . POLYPECTOMY     OF PENIS   Social History   Occupational History  . Occupation: PLUMBER  Tobacco Use  . Smoking status: Former Smoker    Packs/day: 0.50    Years: 1.00    Pack years: 0.50    Types: Cigarettes    Last attempt to quit: 10/03/1973    Years since quitting: 43.8  . Smokeless tobacco: Never Used  Substance and Sexual Activity  . Alcohol use: No  . Drug use: No  . Sexual activity: Not on file

## 2017-09-04 ENCOUNTER — Ambulatory Visit (INDEPENDENT_AMBULATORY_CARE_PROVIDER_SITE_OTHER): Payer: Self-pay

## 2017-09-04 DIAGNOSIS — Z23 Encounter for immunization: Secondary | ICD-10-CM

## 2017-11-22 ENCOUNTER — Other Ambulatory Visit: Payer: Self-pay

## 2017-11-27 ENCOUNTER — Other Ambulatory Visit: Payer: Self-pay | Admitting: *Deleted

## 2017-11-27 MED ORDER — PANTOPRAZOLE SODIUM 40 MG PO TBEC: 40.0000 mg | DELAYED_RELEASE_TABLET | Freq: Every day | ORAL | 0 refills | Status: DC

## 2017-11-27 MED ORDER — ESCITALOPRAM OXALATE 20 MG PO TABS: 20.0000 mg | ORAL_TABLET | Freq: Every day | ORAL | 0 refills | Status: DC

## 2017-11-29 ENCOUNTER — Encounter: Payer: Self-pay | Admitting: Family Medicine

## 2017-12-07 ENCOUNTER — Other Ambulatory Visit: Payer: Self-pay | Admitting: Family Medicine

## 2017-12-07 DIAGNOSIS — Z125 Encounter for screening for malignant neoplasm of prostate: Secondary | ICD-10-CM

## 2017-12-07 DIAGNOSIS — Z79899 Other long term (current) drug therapy: Secondary | ICD-10-CM

## 2017-12-07 DIAGNOSIS — E785 Hyperlipidemia, unspecified: Secondary | ICD-10-CM

## 2017-12-08 ENCOUNTER — Other Ambulatory Visit: Payer: Self-pay

## 2017-12-08 DIAGNOSIS — Z125 Encounter for screening for malignant neoplasm of prostate: Secondary | ICD-10-CM

## 2017-12-08 DIAGNOSIS — E785 Hyperlipidemia, unspecified: Secondary | ICD-10-CM

## 2017-12-08 DIAGNOSIS — Z79899 Other long term (current) drug therapy: Secondary | ICD-10-CM

## 2017-12-08 LAB — HEPATIC FUNCTION PANEL
ALBUMIN: 4 g/dL (ref 3.5–5.2)
ALK PHOS: 50 U/L (ref 39–117)
ALT: 19 U/L (ref 0–53)
AST: 22 U/L (ref 0–37)
Bilirubin, Direct: 0.1 mg/dL (ref 0.0–0.3)
TOTAL PROTEIN: 6.4 g/dL (ref 6.0–8.3)
Total Bilirubin: 0.5 mg/dL (ref 0.2–1.2)

## 2017-12-08 LAB — CBC WITH DIFFERENTIAL/PLATELET
BASOS ABS: 0 10*3/uL (ref 0.0–0.1)
Basophils Relative: 0.5 % (ref 0.0–3.0)
Eosinophils Absolute: 0.1 10*3/uL (ref 0.0–0.7)
Eosinophils Relative: 1.4 % (ref 0.0–5.0)
HCT: 45.1 % (ref 39.0–52.0)
Hemoglobin: 15 g/dL (ref 13.0–17.0)
LYMPHS ABS: 1.3 10*3/uL (ref 0.7–4.0)
Lymphocytes Relative: 21.3 % (ref 12.0–46.0)
MCHC: 33.3 g/dL (ref 30.0–36.0)
MCV: 93.8 fl (ref 78.0–100.0)
MONO ABS: 0.6 10*3/uL (ref 0.1–1.0)
Monocytes Relative: 10.4 % (ref 3.0–12.0)
NEUTROS PCT: 66.4 % (ref 43.0–77.0)
Neutro Abs: 3.9 10*3/uL (ref 1.4–7.7)
Platelets: 282 10*3/uL (ref 150.0–400.0)
RBC: 4.81 Mil/uL (ref 4.22–5.81)
RDW: 13.6 % (ref 11.5–15.5)
WBC: 5.9 10*3/uL (ref 4.0–10.5)

## 2017-12-08 LAB — BASIC METABOLIC PANEL
BUN: 15 mg/dL (ref 6–23)
CHLORIDE: 104 meq/L (ref 96–112)
CO2: 28 meq/L (ref 19–32)
Calcium: 9.4 mg/dL (ref 8.4–10.5)
Creatinine, Ser: 1 mg/dL (ref 0.40–1.50)
GFR: 81.18 mL/min (ref >60.00)
GLUCOSE: 99 mg/dL (ref 70–99)
POTASSIUM: 4.5 meq/L (ref 3.5–5.1)
SODIUM: 139 meq/L (ref 135–145)

## 2017-12-08 LAB — LIPID PANEL
CHOLESTEROL: 130 mg/dL (ref 0–200)
HDL: 44.8 mg/dL (ref >39.00)
LDL Cholesterol: 64 mg/dL (ref 0–99)
NonHDL: 85.15
Total CHOL/HDL Ratio: 3
Triglycerides: 107 mg/dL (ref 0.0–149.0)
VLDL: 21.4 mg/dL (ref 0.0–40.0)

## 2017-12-08 LAB — PSA: PSA: 0.5 ng/mL (ref 0.10–4.00)

## 2017-12-13 ENCOUNTER — Other Ambulatory Visit: Payer: Self-pay

## 2017-12-13 ENCOUNTER — Ambulatory Visit (INDEPENDENT_AMBULATORY_CARE_PROVIDER_SITE_OTHER): Payer: Self-pay | Admitting: Family Medicine

## 2017-12-13 ENCOUNTER — Encounter: Payer: Self-pay | Admitting: Family Medicine

## 2017-12-13 VITALS — BP 100/70 | HR 81 | Temp 98.3°F | Ht 71.0 in | Wt 277.0 lb

## 2017-12-13 DIAGNOSIS — E7800 Pure hypercholesterolemia, unspecified: Secondary | ICD-10-CM

## 2017-12-13 DIAGNOSIS — E669 Obesity, unspecified: Secondary | ICD-10-CM

## 2017-12-13 MED ORDER — SIMVASTATIN 40 MG PO TABS: 40.0000 mg | ORAL_TABLET | Freq: Every day | ORAL | 3 refills | Status: DC

## 2017-12-13 NOTE — Progress Notes (Signed)
Dr. Frederico Hamman T. Orlandus Borowski, MD, [facility] [place], [address] Phone: [phone] Fax: [phone]  12/13/2017  Patient: Bryan Kirby, MRN: [id number], DOB: [birth year], 62 y.o.  Primary Physician:  Owens Loffler, MD   Chief Complaint  Patient presents with  . Annual Exam   Subjective:   Bryan Kirby is a 62 y.o. very pleasant male patient who presents with the following:  [place]on is here today just following up on some chronic medical conditions including his cholesterol which is well controlled on Zocor.  He is also doing well on his Lexapro that was started for depression before.  He is continues to struggle some with his weight.  Medication list reviewed and updated in full in [facility].  GEN: No acute illnesses, no fevers, chills. GI: No n/v/d, eating normally Pulm: No SOB Interactive and getting along well at home.  Otherwise, ROS is as per the HPI.  Objective:   BP 100/70   Pulse 81   Temp 98.3 F (36.8 C) (Oral)   Ht 5\' 11"  (1.803 m)   Wt 277 lb (125.6 kg)   BMI 38.63 kg/m   GEN: WDWN, NAD, Non-toxic, A & O x 3 HEENT: Atraumatic, Normocephalic. Neck supple. No masses, No LAD. Ears and Nose: No external deformity. CV: RRR, No M/G/R. No JVD. No thrill. No extra heart sounds. PULM: CTA B, no wheezes, crackles, rhonchi. No retractions. No resp. distress. No accessory muscle use. EXTR: No c/c/e NEURO Normal gait.  PSYCH: Normally interactive. Conversant. Not depressed or anxious appearing.  Calm demeanor.   Laboratory and Imaging Data: Results for orders placed or performed in visit on 12/08/17  PSA  Result Value Ref Range   PSA 0.50 0.10 - 4.00 ng/mL  Basic metabolic panel  Result Value Ref Range   Sodium 139 135 - 145 mEq/L   Potassium 4.5 3.5 - 5.1 mEq/L   Chloride 104 96 - 112 mEq/L   CO2 28 19 - 32 mEq/L   Glucose, Bld 99 70 - 99 mg/dL   BUN 15 6 - 23 mg/dL   Creatinine,  Ser 1.00 0.40 - 1.50 mg/dL   Calcium 9.4 8.4 - 10.5 mg/dL   GFR 81.18 >60.00 mL/min  Hepatic function panel  Result Value Ref Range   Total Bilirubin 0.5 0.2 - 1.2 mg/dL   Bilirubin, Direct 0.1 0.0 - 0.3 mg/dL   Alkaline Phosphatase 50 39 - 117 U/L   AST 22 0 - 37 U/L   ALT 19 0 - 53 U/L   Total Protein 6.4 6.0 - 8.3 g/dL   Albumin 4.0 3.5 - 5.2 g/dL  CBC with Differential/Platelet  Result Value Ref Range   WBC 5.9 4.0 - 10.5 K/uL   RBC 4.81 4.22 - 5.81 Mil/uL   Hemoglobin 15.0 13.0 - 17.0 g/dL   HCT 45.1 39.0 - 52.0 %   MCV 93.8 78.0 - 100.0 fl   MCHC 33.3 30.0 - 36.0 g/dL   RDW 13.6 11.5 - 15.5 %   Platelets 282.0 150.0 - 400.0 K/uL   Neutrophils Relative % 66.4 43.0 - 77.0 %   Lymphocytes Relative 21.3 12.0 - 46.0 %   Monocytes Relative 10.4 3.0 - 12.0 %   Eosinophils Relative 1.4 0.0 - 5.0 %   Basophils Relative 0.5 0.0 - 3.0 %   Neutro Abs 3.9 1.4 - 7.7 K/uL   Lymphs Abs 1.3 0.7 - 4.0 K/uL  Monocytes Absolute 0.6 0.1 - 1.0 K/uL   Eosinophils Absolute 0.1 0.0 - 0.7 K/uL   Basophils Absolute 0.0 0.0 - 0.1 K/uL  Lipid panel  Result Value Ref Range   Cholesterol 130 0 - 200 mg/dL   Triglycerides 107.0 0.0 - 149.0 mg/dL   HDL 44.80 >39.00 mg/dL   VLDL 21.4 0.0 - 40.0 mg/dL   LDL Cholesterol 64 0 - 99 mg/dL   Total CHOL/HDL Ratio 3    NonHDL 85.15      Assessment and Plan:   Pure hypercholesterolemia  Obesity (BMI 30-39.9)  Work on weight loss  Follow-up: No Follow-up on file.  Meds ordered this encounter  Medications  . simvastatin (ZOCOR) 40 MG tablet    Sig: Take 1 tablet (40 mg total) by mouth at bedtime.    Dispense:  90 tablet    Refill:  3   Medications Discontinued During This Encounter  Medication Reason  . simvastatin (ZOCOR) 40 MG tablet Reorder   Signed,  Frederico Hamman T. Wateen Varon, MD   Allergies as of 12/13/2017      Reactions   Codeine    REACTION: itching   Penicillins    REACTION: UNSPECIFIED      Medication List        Accurate  as of 12/13/17 11:59 PM. Always use your most recent med list.          aspirin 81 MG tablet Take 81 mg by mouth daily.   escitalopram 20 MG tablet Commonly known as:  LEXAPRO Take 1 tablet (20 mg total) by mouth daily.   pantoprazole 40 MG tablet Commonly known as:  PROTONIX Take 1 tablet (40 mg total) by mouth daily.   simvastatin 40 MG tablet Commonly known as:  ZOCOR Take 1 tablet (40 mg total) by mouth at bedtime.

## 2017-12-28 ENCOUNTER — Encounter: Payer: Self-pay | Admitting: Family Medicine

## 2018-01-10 ENCOUNTER — Encounter (INDEPENDENT_AMBULATORY_CARE_PROVIDER_SITE_OTHER): Payer: Self-pay | Admitting: Orthopedic Surgery

## 2018-01-10 ENCOUNTER — Ambulatory Visit (INDEPENDENT_AMBULATORY_CARE_PROVIDER_SITE_OTHER): Payer: Self-pay | Admitting: Orthopedic Surgery

## 2018-01-10 ENCOUNTER — Ambulatory Visit (INDEPENDENT_AMBULATORY_CARE_PROVIDER_SITE_OTHER): Payer: Self-pay

## 2018-01-10 VITALS — BP 111/62 | HR 71 | Resp 14 | Ht 71.5 in | Wt 275.0 lb

## 2018-01-10 DIAGNOSIS — M1712 Unilateral primary osteoarthritis, left knee: Secondary | ICD-10-CM

## 2018-01-10 DIAGNOSIS — M25562 Pain in left knee: Secondary | ICD-10-CM

## 2018-01-10 DIAGNOSIS — S8002XA Contusion of left knee, initial encounter: Secondary | ICD-10-CM

## 2018-01-10 NOTE — Progress Notes (Signed)
Office Visit Note   Patient: Bryan Kirby           Date of Birth: [birth year]           MRN: [id number] Visit Date: 01/10/2018              Requested by: Owens Loffler, MD [place], [place] [address] PCP: Owens Loffler, MD   Assessment & Plan: Visit Diagnoses:  1. Patellar pain, left   2. Contusion of left patella, initial encounter   3. Unilateral primary osteoarthritis, left knee     Plan:  #1: He is going to use Voltaren gel over the patellar area as an anti-inflammatory. #2: He is getting himself a new set of tortoise shell gel kneepads called thunderbolt #3: If in the next 2-3 weeks is not doing any better he will call us and we will schedule an MRI scan of his left knee looking at the patellar area.  Follow-Up Instructions: Return in about 2 weeks (around 01/24/2018).   Orders:  Orders Placed This Encounter  Procedures  . XR Knee Complete 4 Views Left   No orders of the defined types were placed in this encounter.     Procedures: No procedures performed   Clinical Data: No additional findings.   Subjective: Chief Complaint  Patient presents with  . Left Knee - Pain  . Knee Pain    Left knee pain x 1 month, crawling under house - rock, popping, clicking, weakness, grinding noises, swelling, difficulty walking, elevation, ice, Tylenol helped, not diabetic, arthroscopic to left knee- Dr. 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  He has been complaining of clicking and grinding noise as well as swelling.  When he rests the swelling improves.  Still having difficulty kneeling on the knee with pain centered into the patellar area.  Even states that he has difficulty with walking.  He is used elevation and ice as  well as Tylenol which have helped.  Review of Systems  Constitutional: Positive for activity change.  HENT: Negative for trouble swallowing.   Eyes: Negative for pain.  Respiratory: Negative for shortness of breath.   Cardiovascular: Positive for leg swelling.  Gastrointestinal: Negative for constipation.  Endocrine: Negative for cold intolerance.  Genitourinary: Negative for difficulty urinating.  Musculoskeletal: Positive for gait problem and joint swelling.  Skin: Negative for rash.  Allergic/Immunologic: Negative for food allergies.  Neurological: Positive for weakness and numbness.  Hematological: Does not bruise/bleed easily.  Psychiatric/Behavioral: Negative for sleep disturbance.     Objective: Vital Signs: BP 111/62 (BP Location: Right Arm, Patient Position: Sitting, Cuff Size: Normal)   Pulse 71   Resp 14   Ht 5' 11.5" (1.816 m)   Wt 275 lb (124.7 kg)   BMI 37.82 kg/m   Physical Exam  Constitutional: He is oriented to person, place, and time. He appears well-developed and well-nourished.  HENT:  Head: Normocephalic and atraumatic.  Eyes: Pupils are equal, round, and reactive to light. EOM are normal.  Pulmonary/Chest: Effort normal.  Neurological: He is alert and oriented to person, place, and time.  Skin: Skin is warm and dry.  Psychiatric: He has a normal mood and affect. His behavior is normal. Judgment  and thought content normal.    Ortho Exam  He lacks a few degrees shy of full extension.  Flexion to about 95-100 degrees.  Tenderness over the patella where there is some scar tissue in the bursa which does give him some pain to palpation.  He does have some crepitance with range of motion also.  Ligamentously stable.  Specialty Comments:  No specialty comments available.  Imaging: Xr Knee Complete 4 Views Left  Result Date: 01/10/2018 4 view x-ray of the left knee reveals patellofemoral degenerative changes.  He also has some ossification over the dorsum  of the patella best seen on the sunrise view.  He does have some peaking of the intercondylar eminence.  Small amount of spurring both medially and laterally.  Appears he may have more narrowing of the lateral joint space versus the medial joint space.    PMFS History: Current Outpatient Medications  Medication Sig Dispense Refill  . aspirin 81 MG tablet Take 81 mg by mouth daily.    Marland Kitchen escitalopram (LEXAPRO) 20 MG tablet Take 1 tablet (20 mg total) by mouth daily. 90 tablet 0  . pantoprazole (PROTONIX) 40 MG tablet Take 1 tablet (40 mg total) by mouth daily. 90 tablet 0  . simvastatin (ZOCOR) 40 MG tablet Take 1 tablet (40 mg total) by mouth at bedtime. 90 tablet 3   No current facility-administered medications for this visit.      Patient Active Problem List   Diagnosis Date Noted  . Lung nodule 09/30/2014  . OSA (obstructive sleep apnea) 12/20/2013  . Obesity (BMI 30-39.9) 08/27/2013  . Major depression in partial remission ([place]) 03/16/2007  . HLD (hyperlipidemia) 03/15/2007  . Allergic rhinitis 03/15/2007  . HERNIATED DISC  L1/2 R SIDE 03/15/2007   Past Medical History:  Diagnosis Date  . Allergic rhinitis due to pollen   . Colon polyps   . DVT (deep venous thrombosis) (HCC) 2000   Right leg  . Family history of adverse reaction to anesthesia    " MY MOTHER "   . GERD (gastroesophageal reflux disease)   . Hyperlipidemia   . Major depression in partial remission ([place]) 03/16/2007   Qualifier: Diagnosis of  By: Council Mechanic MD, Hilaria Ota   . Sleep apnea    USES CPAP    Family History  Problem Relation Age of Onset  . Fibromyalgia Mother   . Depression Mother   . Cancer Father        PROSTATECTOMY  . Stroke Father   . Cancer Sister        CERVICAL  . Stroke Paternal Grandfather   . Hypertension Paternal Grandfather   . Diabetes Paternal Grandfather     Past Surgical History:  Procedure Laterality Date  . CARPAL TUNNEL RELEASE Right   . CYSTECTOMY    . EYE SURGERY     . KNEE ARTHROSCOPY     LEFT LAT MENISCUS TEAR  . KNEE SURGERY     LEFT KNEE CAP WIRING AND PATELLAR REATTACHMENT  . LEFT HEART CATHETERIZATION WITH CORONARY ANGIOGRAM N/A 10/02/2014   Procedure: LEFT HEART CATHETERIZATION WITH CORONARY ANGIOGRAM;  Surgeon: Sanda Klein, MD;  Location: [facility] CATH [facility];  Service: Cardiovascular;  Laterality: N/A;  . ORCHIECTOMY     MALDEVELOPEMENT AFTER ORCHITIS  . POLYPECTOMY     OF PENIS   Social History   Occupational History  . Occupation: PLUMBER  Tobacco Use  . Smoking status: Former Smoker    Packs/day: 0.50    Years:  1.00    Pack years: 0.50    Types: Cigarettes    Last attempt to quit: 10/03/1973    Years since quitting: 44.3  . Smokeless tobacco: Never Used  Substance and Sexual Activity  . Alcohol use: No  . Drug use: No  . Sexual activity: Not on file

## 2018-01-15 ENCOUNTER — Ambulatory Visit (INDEPENDENT_AMBULATORY_CARE_PROVIDER_SITE_OTHER): Payer: Self-pay | Admitting: Orthopaedic Surgery

## 2018-02-07 ENCOUNTER — Telehealth (INDEPENDENT_AMBULATORY_CARE_PROVIDER_SITE_OTHER): Payer: Self-pay | Admitting: Orthopaedic Surgery

## 2018-02-07 NOTE — Telephone Encounter (Signed)
Patient request an rx for Voltaren Gel. Patient would like to pick up rx if possible. Patient not sure which pharmacy he will be using. Please call when ready to pick up.

## 2018-02-08 ENCOUNTER — Other Ambulatory Visit (INDEPENDENT_AMBULATORY_CARE_PROVIDER_SITE_OTHER): Payer: Self-pay | Admitting: *Deleted

## 2018-02-08 ENCOUNTER — Telehealth (INDEPENDENT_AMBULATORY_CARE_PROVIDER_SITE_OTHER): Payer: Self-pay | Admitting: Orthopaedic Surgery

## 2018-02-08 MED ORDER — DICLOFENAC SODIUM 1 % TD GEL: 4.0000 g | Freq: Four times a day (QID) | TRANSDERMAL | 4 refills | Status: DC | PRN

## 2018-02-08 NOTE — Telephone Encounter (Signed)
yes

## 2018-02-08 NOTE — Telephone Encounter (Signed)
Patient called stating he is going out of town tomorrow and would like to pick up his prescription for Voltaren Gel this afternoon if possible.  Patient requests a return call when it is ready and he will come to the office to pick it up.

## 2018-02-08 NOTE — Telephone Encounter (Signed)
OK to prescribe 

## 2018-02-27 ENCOUNTER — Telehealth: Payer: Self-pay | Admitting: Family Medicine

## 2018-02-27 MED ORDER — PANTOPRAZOLE SODIUM 40 MG PO TBEC: 40.0000 mg | DELAYED_RELEASE_TABLET | Freq: Every day | ORAL | 3 refills | Status: DC

## 2018-02-27 MED ORDER — ESCITALOPRAM OXALATE 20 MG PO TABS: 20.0000 mg | ORAL_TABLET | Freq: Every day | ORAL | 1 refills | Status: DC

## 2018-02-27 NOTE — Telephone Encounter (Signed)
I will sign in AM

## 2018-02-27 NOTE — Telephone Encounter (Signed)
Copied from [place] [id number]. Topic: Quick Communication - Rx Refill/Question >> Feb 27, 2018  8:35 AM Margot Ables wrote: Medication: escitalopram (LEXAPRO) 20 MG tablet & pantoprazole (PROTONIX) 40 MG tablet - pt only has 2 days left Pt states that he shops around for pharmacy pricing - pt would prefer a hard copy RX and come to pick it up so he can check with several pharmacies - pt does not have insurance Has the patient contacted their pharmacy? No - see above Preferred Pharmacy (with phone number or street name): wants to pick up hard copy

## 2018-02-27 NOTE — Telephone Encounter (Signed)
Prescriptions printed and placed in Dr. Lillie Fragmin in box for signatures.

## 2018-02-28 NOTE — Telephone Encounter (Signed)
Bryan Kirby notified prescription are ready to be picked up at the front desk.

## 2018-08-22 ENCOUNTER — Telehealth: Payer: Self-pay | Admitting: Family Medicine

## 2018-08-22 MED ORDER — ESCITALOPRAM OXALATE 20 MG PO TABS: 20.0000 mg | ORAL_TABLET | Freq: Every day | ORAL | 0 refills | Status: DC

## 2018-08-22 NOTE — Telephone Encounter (Signed)
° ° °  1. Which medications need to be refilled? (please list name of each medication and dose if known) Lexapro 20 mg  2. Which pharmacy/location (including street and city if local pharmacy) is medication to be sent to?[facility] [place] [facility]

## 2018-08-22 NOTE — Telephone Encounter (Signed)
Refill sent as requested. 

## 2018-11-13 ENCOUNTER — Ambulatory Visit (INDEPENDENT_AMBULATORY_CARE_PROVIDER_SITE_OTHER): Payer: Self-pay | Admitting: Orthopaedic Surgery

## 2018-11-19 ENCOUNTER — Telehealth: Payer: Self-pay

## 2018-11-19 ENCOUNTER — Other Ambulatory Visit: Payer: Self-pay | Admitting: Family Medicine

## 2018-11-21 NOTE — Telephone Encounter (Signed)
Error there was a refill request in also.

## 2018-12-16 NOTE — Progress Notes (Signed)
     Dr. Frederico Hamman T. Jerimiah Wolman, MD, [facility] [place], [address] Phone: [phone] Fax: [phone]  12/19/2018  Patient: Bryan Kirby, MRN: [id number], DOB: 26-Aug-1956, 63 y.o.  Primary Physician:  Owens Loffler, MD   Chief Complaint  Patient presents with  . Annual Exam   Subjective:   Bryan Kirby is a 63 y.o. very pleasant male patient who presents with the following:  He is here for basic medical follow-up  Lipids: Doing well, stable. Tolerating meds fine with no SE. Panel reviewed with patient.  Lipids:    Component Value Date/Time   CHOL 134 12/17/2018 0838   TRIG 135.0 12/17/2018 0838   HDL 38.10 (L) 12/17/2018 0838   LDLDIRECT 92.8 08/27/2013 0923   VLDL 27.0 12/17/2018 0838   CHOLHDL 4 12/17/2018 0838    Lab Results  Component Value Date   ALT 16 12/17/2018   AST 22 12/17/2018   ALKPHOS 50 12/17/2018   BILITOT 0.4 12/17/2018    Wt Readings from Last 3 Encounters:  12/19/18 279 lb 12 oz (126.9 kg)  01/10/18 275 lb (124.7 kg)  12/13/17 277 lb (125.6 kg)    GEN: No acute illnesses, no fevers, chills. GI: No n/v/d, eating normally Pulm: No SOB Interactive and getting along well at home.  Otherwise, ROS is as per the HPI.  Objective:   BP 100/60   Pulse 80   Temp 98.5 F (36.9 C) (Oral)   Ht 5\' 11"  (1.803 m)   Wt 279 lb 12 oz (126.9 kg)   BMI 39.02 kg/m   GEN: WDWN, NAD, Non-toxic, A & O x 3 HEENT: Atraumatic, Normocephalic. Neck supple. No masses, No LAD. Ears and Nose: No external deformity. CV: RRR, No M/G/R. No JVD. No thrill. No extra heart sounds. PULM: CTA B, no wheezes, crackles, rhonchi. No retractions. No resp. distress. No accessory muscle use. EXTR: No c/c/e NEURO Normal gait.  PSYCH: Normally interactive. Conversant. Not depressed or anxious appearing.  Calm demeanor.   Laboratory and Imaging Data:  Assessment and Plan:   Pure hypercholesterolemia   Major depressive disorder with single episode, in partial remission (HCC)  Obesity (BMI 35.0-39.9 without comorbidity)  Labs all stable, but he needs to lose weight  Follow-up: 1 yr  Signed,  Frederico Hamman T. Blossie Raffel, MD   Outpatient Encounter Medications as of 12/19/2018  Medication Sig  . aspirin 81 MG tablet Take 81 mg by mouth daily.  . diclofenac sodium (VOLTAREN) 1 % GEL Apply 4 g topically 4 (four) times daily as needed. Apply to large joint PRN  . escitalopram (LEXAPRO) 20 MG tablet TAKE ONE TABLET BY MOUTH DAILY  . pantoprazole (PROTONIX) 40 MG tablet Take 1 tablet (40 mg total) by mouth daily.  . simvastatin (ZOCOR) 40 MG tablet Take 1 tablet (40 mg total) by mouth at bedtime.   No facility-administered encounter medications on file as of 12/19/2018.

## 2018-12-17 ENCOUNTER — Other Ambulatory Visit (INDEPENDENT_AMBULATORY_CARE_PROVIDER_SITE_OTHER): Payer: Self-pay

## 2018-12-17 ENCOUNTER — Other Ambulatory Visit: Payer: Self-pay

## 2018-12-17 DIAGNOSIS — E7800 Pure hypercholesterolemia, unspecified: Secondary | ICD-10-CM

## 2018-12-17 DIAGNOSIS — Z79899 Other long term (current) drug therapy: Secondary | ICD-10-CM

## 2018-12-17 DIAGNOSIS — Z125 Encounter for screening for malignant neoplasm of prostate: Secondary | ICD-10-CM

## 2018-12-17 LAB — HEMOGLOBIN A1C: Hgb A1c MFr Bld: 6.2 % (ref 4.6–6.5)

## 2018-12-17 LAB — CBC WITH DIFFERENTIAL/PLATELET
BASOS ABS: 0 10*3/uL (ref 0.0–0.1)
BASOS PCT: 0.3 % (ref 0.0–3.0)
EOS ABS: 0.1 10*3/uL (ref 0.0–0.7)
Eosinophils Relative: 1.6 % (ref 0.0–5.0)
HEMATOCRIT: 45 % (ref 39.0–52.0)
HEMOGLOBIN: 14.9 g/dL (ref 13.0–17.0)
Lymphocytes Relative: 26.7 % (ref 12.0–46.0)
Lymphs Abs: 1.4 10*3/uL (ref 0.7–4.0)
MCHC: 33.2 g/dL (ref 30.0–36.0)
MCV: 93.8 fl (ref 78.0–100.0)
Monocytes Absolute: 0.6 10*3/uL (ref 0.1–1.0)
Monocytes Relative: 12.3 % — ABNORMAL HIGH (ref 3.0–12.0)
Neutro Abs: 3 10*3/uL (ref 1.4–7.7)
Neutrophils Relative %: 59.1 % (ref 43.0–77.0)
Platelets: 240 10*3/uL (ref 150.0–400.0)
RBC: 4.79 Mil/uL (ref 4.22–5.81)
RDW: 13.3 % (ref 11.5–15.5)
WBC: 5.2 10*3/uL (ref 4.0–10.5)

## 2018-12-17 LAB — BASIC METABOLIC PANEL
BUN: 17 mg/dL (ref 6–23)
CHLORIDE: 103 meq/L (ref 96–112)
CO2: 30 mEq/L (ref 19–32)
CREATININE: 1.16 mg/dL (ref 0.40–1.50)
Calcium: 9.3 mg/dL (ref 8.4–10.5)
GFR: 64.14 mL/min (ref >60.00)
Glucose, Bld: 108 mg/dL — ABNORMAL HIGH (ref 70–99)
Potassium: 4.5 mEq/L (ref 3.5–5.1)
Sodium: 139 mEq/L (ref 135–145)

## 2018-12-17 LAB — LIPID PANEL
CHOL/HDL RATIO: 4
Cholesterol: 134 mg/dL (ref 0–200)
HDL: 38.1 mg/dL — ABNORMAL LOW (ref >39.00)
LDL Cholesterol: 68 mg/dL (ref 0–99)
NonHDL: 95.42
TRIGLYCERIDES: 135 mg/dL (ref 0.0–149.0)
VLDL: 27 mg/dL (ref 0.0–40.0)

## 2018-12-17 LAB — HEPATIC FUNCTION PANEL
ALK PHOS: 50 U/L (ref 39–117)
ALT: 16 U/L (ref 0–53)
AST: 22 U/L (ref 0–37)
Albumin: 4 g/dL (ref 3.5–5.2)
BILIRUBIN DIRECT: 0.1 mg/dL (ref 0.0–0.3)
TOTAL PROTEIN: 6.5 g/dL (ref 6.0–8.3)
Total Bilirubin: 0.4 mg/dL (ref 0.2–1.2)

## 2018-12-18 LAB — PSA, TOTAL AND FREE
PSA, % FREE: 50 % (ref >25)
PSA, Free: 0.2 ng/mL
PSA, Total: 0.4 ng/mL (ref <=4.0)

## 2018-12-19 ENCOUNTER — Encounter: Payer: Self-pay | Admitting: Family Medicine

## 2018-12-19 ENCOUNTER — Other Ambulatory Visit: Payer: Self-pay

## 2018-12-19 ENCOUNTER — Ambulatory Visit: Payer: Self-pay | Admitting: Family Medicine

## 2018-12-19 VITALS — BP 100/60 | HR 80 | Temp 98.5°F | Ht 71.0 in | Wt 279.8 lb

## 2018-12-19 DIAGNOSIS — F324 Major depressive disorder, single episode, in partial remission: Secondary | ICD-10-CM

## 2018-12-19 DIAGNOSIS — E669 Obesity, unspecified: Secondary | ICD-10-CM

## 2018-12-19 DIAGNOSIS — E7800 Pure hypercholesterolemia, unspecified: Secondary | ICD-10-CM

## 2019-01-09 ENCOUNTER — Other Ambulatory Visit: Payer: Self-pay | Admitting: Family Medicine

## 2019-02-07 ENCOUNTER — Ambulatory Visit: Payer: Self-pay | Admitting: Orthopaedic Surgery

## 2019-02-07 ENCOUNTER — Encounter: Payer: Self-pay | Admitting: Orthopaedic Surgery

## 2019-02-07 ENCOUNTER — Ambulatory Visit (INDEPENDENT_AMBULATORY_CARE_PROVIDER_SITE_OTHER): Payer: Self-pay

## 2019-02-07 ENCOUNTER — Other Ambulatory Visit: Payer: Self-pay

## 2019-02-07 VITALS — BP 111/70 | HR 66 | Ht 71.0 in | Wt 280.0 lb

## 2019-02-07 DIAGNOSIS — M25561 Pain in right knee: Secondary | ICD-10-CM

## 2019-02-07 DIAGNOSIS — G8929 Other chronic pain: Secondary | ICD-10-CM

## 2019-02-07 DIAGNOSIS — M1711 Unilateral primary osteoarthritis, right knee: Secondary | ICD-10-CM

## 2019-02-07 MED ORDER — METHYLPREDNISOLONE ACETATE 40 MG/ML IJ SUSP
80.0000 mg | INTRAMUSCULAR | Status: AC | PRN
  Administered 2019-02-07: 80 mg via INTRA_ARTICULAR

## 2019-02-07 MED ORDER — BUPIVACAINE HCL 0.5 % IJ SOLN
2.0000 mL | INTRAMUSCULAR | Status: AC | PRN
  Administered 2019-02-07: 2 mL via INTRA_ARTICULAR

## 2019-02-07 MED ORDER — LIDOCAINE HCL 1 % IJ SOLN
2.0000 mL | INTRAMUSCULAR | Status: AC | PRN
  Administered 2019-02-07: 13:00:00 2 mL

## 2019-02-07 NOTE — Progress Notes (Signed)
Office Visit Note   Patient: Bryan Kirby           Date of Birth: April 15, 1956           MRN: [id number] Visit Date: 02/07/2019              Requested by: Owens Loffler, MD [place], [place] [address] PCP: Owens Loffler, MD   Assessment & Plan: Visit Diagnoses:  1. Chronic pain of right knee   2. Unilateral primary osteoarthritis, right knee     Plan: Recurrent pain right knee consistent with exacerbation of osteoarthritis.  Could have a symptomatic plica.  Will continue with over-the-counter medicines and inject the knee with cortisone.  Return as needed  Follow-Up Instructions: Return if symptoms worsen or fail to improve.   Orders:  Orders Placed This Encounter  Procedures  . Large Joint Inj: R knee  . XR KNEE 3 VIEW RIGHT   No orders of the defined types were placed in this encounter.     Procedures: Large Joint Inj: R knee on 02/07/2019 1:12 PM Indications: pain and diagnostic evaluation Details: 25 G 1.5 in needle, anteromedial approach  Arthrogram: No  Medications: 2 mL lidocaine 1 %; 2 mL bupivacaine 0.5 %; 80 mg methylPREDNISolone acetate 40 MG/ML Procedure, treatment alternatives, risks and benefits explained, specific risks discussed. Consent was given by the patient. Immediately prior to procedure a time out was called to verify the correct patient, procedure, equipment, support staff and site/side marked as required. Patient was prepped and draped in the usual sterile fashion.       Clinical Data: No additional findings.   Subjective: Chief Complaint  Patient presents with  . Right Knee - Pain  Patient presents today for right knee pain. He was under the house crawling with knee pads on about three weeks ago, and had pain after on the medial side. The pain has worsened since then. The pain is worse with movement, but hurts constantly. She has some swelling. He has a history of meniscus surgery on the right knee around  15years ago.  Mr. Padula has a history of recurrent right knee pain.  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  Pain seems to be localized along the anterior aspect of his knee and along the medial parapatellar region HPI  Review of Systems  Constitutional: Negative for fatigue.  HENT: Negative for ear pain.   Eyes: Negative for pain.  Respiratory: Negative for shortness of breath.   Cardiovascular: Negative for leg swelling.  Gastrointestinal: Negative for constipation and diarrhea.  Endocrine: Negative for cold intolerance and heat intolerance.  Genitourinary: Negative for difficulty urinating.  Musculoskeletal: Positive for joint swelling.  Skin: Negative for rash.  Allergic/Immunologic: Negative for food allergies.  Neurological: Negative for weakness.  Hematological: Does not bruise/bleed easily.  Psychiatric/Behavioral: Positive for sleep disturbance.     Objective: Vital Signs: BP 111/70   Pulse 66   Ht 5\' 11"  (1.803 m)   Wt 280 lb (127 kg)   BMI 39.05 kg/m   Physical Exam Constitutional:      Appearance: He is well-developed.  Eyes:     Pupils: Pupils are equal, round, and reactive to light.  Pulmonary:     Effort: Pulmonary effort is normal.  Skin:    General: Skin is warm and dry.  Neurological:     Mental Status: He is alert  and oriented to person, place, and time.  Psychiatric:        Behavior: Behavior normal.     Ortho Exam right knee was not hot red warm or swollen.  No effusion.  Does have some pain with patella compression.  There is some pain along the medial parapatellar region with a palpable plica.  No medial lateral joint pain.  No opening with varus or valgus stress full extension and flexion comparable to the left knee  Specialty Comments:  No specialty comments available.  Imaging: Xr Knee 3 View Right  Result Date: 02/07/2019 Films of the right knee were obtained in 3  projections standing.  Very minimal varus.  There is some narrowing of the medial joint space.  No evidence of acute fracture.  Patellofemoral joint intact but there are small peripheral osteophytes.  No evidence of CPPD.  Rubs are consistent with mild to moderate osteoarthritis    PMFS History: Patient Active Problem List   Diagnosis Date Noted  . Chronic pain of right knee 02/07/2019  . Unilateral primary osteoarthritis, right knee 02/07/2019  . Lung nodule 09/30/2014  . OSA (obstructive sleep apnea) 12/20/2013  . Obesity (BMI 30-39.9) 08/27/2013  . Major depression in partial remission ([place]) 03/16/2007  . HLD (hyperlipidemia) 03/15/2007  . Allergic rhinitis 03/15/2007  . HERNIATED DISC  L1/2 R SIDE 03/15/2007   Past Medical History:  Diagnosis Date  . Allergic rhinitis due to pollen   . Colon polyps   . DVT (deep venous thrombosis) (HCC) 2000   Right leg  . Family history of adverse reaction to anesthesia    " MY MOTHER "   . GERD (gastroesophageal reflux disease)   . Hyperlipidemia   . Major depression in partial remission ([place]) 03/16/2007   Qualifier: Diagnosis of  By: Council Mechanic MD, Hilaria Ota   . Sleep apnea    USES CPAP    Family History  Problem Relation Age of Onset  . Fibromyalgia Mother   . Depression Mother   . Cancer Father        PROSTATECTOMY  . Stroke Father   . Cancer Sister        CERVICAL  . Stroke Paternal Grandfather   . Hypertension Paternal Grandfather   . Diabetes Paternal Grandfather     Past Surgical History:  Procedure Laterality Date  . CARPAL TUNNEL RELEASE Right   . CYSTECTOMY    . EYE SURGERY    . KNEE ARTHROSCOPY     LEFT LAT MENISCUS TEAR  . KNEE SURGERY     LEFT KNEE CAP WIRING AND PATELLAR REATTACHMENT  . LEFT HEART CATHETERIZATION WITH CORONARY ANGIOGRAM N/A 10/02/2014   Procedure: LEFT HEART CATHETERIZATION WITH CORONARY ANGIOGRAM;  Surgeon: Sanda Klein, MD;  Location: [facility] CATH LAB;  Service: Cardiovascular;  Laterality:  N/A;  . ORCHIECTOMY     MALDEVELOPEMENT AFTER ORCHITIS  . POLYPECTOMY     OF PENIS   Social History   Occupational History  . Occupation: PLUMBER  Tobacco Use  . Smoking status: Former Smoker    Packs/day: 0.50    Years: 1.00    Pack years: 0.50    Types: Cigarettes    Last attempt to quit: 10/03/1973    Years since quitting: 45.3  . Smokeless tobacco: Never Used  Substance and Sexual Activity  . Alcohol use: No  . Drug use: No  . Sexual activity: Not on file

## 2019-02-20 ENCOUNTER — Other Ambulatory Visit: Payer: Self-pay | Admitting: Family Medicine

## 2019-03-13 ENCOUNTER — Other Ambulatory Visit: Payer: Self-pay | Admitting: Family Medicine

## 2019-07-03 ENCOUNTER — Ambulatory Visit: Payer: Self-pay | Admitting: Podiatry

## 2019-08-12 ENCOUNTER — Other Ambulatory Visit: Payer: Self-pay | Admitting: Family Medicine

## 2019-08-15 ENCOUNTER — Other Ambulatory Visit: Payer: Self-pay | Admitting: Family Medicine

## 2019-08-27 ENCOUNTER — Other Ambulatory Visit: Payer: Self-pay

## 2019-08-27 DIAGNOSIS — Z20822 Contact with and (suspected) exposure to covid-19: Secondary | ICD-10-CM

## 2019-08-28 LAB — NOVEL CORONAVIRUS, NAA: SARS-CoV-2, NAA: NOT DETECTED

## 2019-10-04 DIAGNOSIS — J189 Pneumonia, unspecified organism: Secondary | ICD-10-CM

## 2019-10-04 HISTORY — DX: Pneumonia, unspecified organism: J18.9

## 2019-11-20 ENCOUNTER — Ambulatory Visit: Payer: Self-pay | Admitting: Orthopaedic Surgery

## 2019-12-09 ENCOUNTER — Telehealth: Payer: Self-pay | Admitting: Family Medicine

## 2019-12-09 NOTE — Telephone Encounter (Signed)
Please schedule CPE with fasting labs with Dr. Copland. 

## 2019-12-10 NOTE — Telephone Encounter (Signed)
Left message asking pt to call office  °

## 2019-12-10 NOTE — Telephone Encounter (Signed)
Labs 3/29 cpx 4/5

## 2019-12-30 ENCOUNTER — Other Ambulatory Visit (INDEPENDENT_AMBULATORY_CARE_PROVIDER_SITE_OTHER): Payer: Self-pay

## 2019-12-30 ENCOUNTER — Other Ambulatory Visit: Payer: Self-pay

## 2019-12-30 DIAGNOSIS — Z125 Encounter for screening for malignant neoplasm of prostate: Secondary | ICD-10-CM

## 2019-12-30 DIAGNOSIS — E785 Hyperlipidemia, unspecified: Secondary | ICD-10-CM

## 2019-12-30 DIAGNOSIS — Z131 Encounter for screening for diabetes mellitus: Secondary | ICD-10-CM

## 2019-12-30 DIAGNOSIS — Z79899 Other long term (current) drug therapy: Secondary | ICD-10-CM

## 2019-12-30 LAB — CBC WITH DIFFERENTIAL/PLATELET
Basophils Absolute: 0 10*3/uL (ref 0.0–0.1)
Basophils Relative: 0.6 % (ref 0.0–3.0)
Eosinophils Absolute: 0.1 10*3/uL (ref 0.0–0.7)
Eosinophils Relative: 1.8 % (ref 0.0–5.0)
HCT: 45.3 % (ref 39.0–52.0)
Hemoglobin: 15.2 g/dL (ref 13.0–17.0)
Lymphocytes Relative: 26.3 % (ref 12.0–46.0)
Lymphs Abs: 1.4 10*3/uL (ref 0.7–4.0)
MCHC: 33.5 g/dL (ref 30.0–36.0)
MCV: 94.5 fl (ref 78.0–100.0)
Monocytes Absolute: 0.7 10*3/uL (ref 0.1–1.0)
Monocytes Relative: 12.2 % — ABNORMAL HIGH (ref 3.0–12.0)
Neutro Abs: 3.2 10*3/uL (ref 1.4–7.7)
Neutrophils Relative %: 59.1 % (ref 43.0–77.0)
Platelets: 249 10*3/uL (ref 150.0–400.0)
RBC: 4.8 Mil/uL (ref 4.22–5.81)
RDW: 13.7 % (ref 11.5–15.5)
WBC: 5.3 10*3/uL (ref 4.0–10.5)

## 2019-12-30 LAB — BASIC METABOLIC PANEL
BUN: 17 mg/dL (ref 6–23)
CO2: 28 mEq/L (ref 19–32)
Calcium: 9.1 mg/dL (ref 8.4–10.5)
Chloride: 104 mEq/L (ref 96–112)
Creatinine, Ser: 1.1 mg/dL (ref 0.40–1.50)
GFR: 67.95 mL/min (ref >60.00)
Glucose, Bld: 106 mg/dL — ABNORMAL HIGH (ref 70–99)
Potassium: 4.8 mEq/L (ref 3.5–5.1)
Sodium: 138 mEq/L (ref 135–145)

## 2019-12-30 LAB — LIPID PANEL
Cholesterol: 166 mg/dL (ref 0–200)
HDL: 41.8 mg/dL (ref >39.00)
LDL Cholesterol: 99 mg/dL (ref 0–99)
NonHDL: 123.95
Total CHOL/HDL Ratio: 4
Triglycerides: 127 mg/dL (ref 0.0–149.0)
VLDL: 25.4 mg/dL (ref 0.0–40.0)

## 2019-12-30 LAB — HEPATIC FUNCTION PANEL
ALT: 21 U/L (ref 0–53)
AST: 27 U/L (ref 0–37)
Albumin: 4.2 g/dL (ref 3.5–5.2)
Alkaline Phosphatase: 52 U/L (ref 39–117)
Bilirubin, Direct: 0.1 mg/dL (ref 0.0–0.3)
Total Bilirubin: 0.4 mg/dL (ref 0.2–1.2)
Total Protein: 6.4 g/dL (ref 6.0–8.3)

## 2019-12-30 LAB — HEMOGLOBIN A1C: Hgb A1c MFr Bld: 6.1 % (ref 4.6–6.5)

## 2019-12-31 LAB — PSA, TOTAL WITH REFLEX TO PSA, FREE: PSA, Total: 0.4 ng/mL (ref <=4.0)

## 2020-01-05 NOTE — Progress Notes (Signed)
Debany Vantol T. Ferrel Simington, MD Primary Care and [place] at [facility] [place], [address] Phone: [phone]  FAX: [place] - 64 y.o. male  MRN [id number]  Date of Birth: 09-03-1956  Visit Date: 01/06/2020  PCP: Owens Loffler, MD  Referred by: Owens Loffler, MD  Chief Complaint  Patient presents with  . Annual Exam    This visit occurred during the SARS-CoV-2 public health emergency.  Safety protocols were in place, including screening questions prior to the visit, additional usage of staff PPE, and extensive cleaning of exam room while observing appropriate contact time as indicated for disinfecting solutions.   Patient Care Team: Owens Loffler, MD as PCP - General (Family Medicine) Subjective:   Bryan Kirby is a 64 y.o. pleasant patient who presents with the following:  Preventative Health Maintenance Visit:  Health Maintenance Summary Reviewed and updated, unless pt declines services.  Tobacco History Reviewed. Alcohol: No concerns, no excessive use Exercise Habits: Some activity, rec at least 30 mins 5 times a week STD concerns: no risk or activity to increase risk Drug Use: None  Places on his head.   Hand oa. Hard to squeeze them well. Lasts in the am.  Bcc? Back of r scalp. Derm - at [facility]. Dr. Allyson Sabal.     Health Maintenance  Topic Date Due  . INFLUENZA VACCINE  05/03/2020  . COLONOSCOPY  07/23/2020  . TETANUS/TDAP  04/09/2023  . Hepatitis C Screening  Completed  . HIV Screening  Completed   Immunization History  Administered Date(s) Administered  . Influenza Split 08/22/2012  . Influenza,inj,Quad PF,6+ Mos 08/27/2013, 07/01/2014, 09/16/2015, 09/04/2017  . Td 05/10/1999  . Tdap 04/08/2013   Patient Active Problem List   Diagnosis Date Noted  . Chronic pain of right knee 02/07/2019  . Unilateral primary osteoarthritis, right knee 02/07/2019  . Lung  nodule 09/30/2014  . OSA (obstructive sleep apnea) 12/20/2013  . Obesity (BMI 30-39.9) 08/27/2013  . Major depression in partial remission ([place]) 03/16/2007  . HLD (hyperlipidemia) 03/15/2007  . Allergic rhinitis 03/15/2007  . HERNIATED DISC  L1/2 R SIDE 03/15/2007    Past Medical History:  Diagnosis Date  . Allergic rhinitis due to pollen   . Colon polyps   . DVT (deep venous thrombosis) (HCC) 2000   Right leg  . Family history of adverse reaction to anesthesia    " MY MOTHER "   . GERD (gastroesophageal reflux disease)   . Hyperlipidemia   . Major depression in partial remission ([place]) 03/16/2007   Qualifier: Diagnosis of  By: Council Mechanic MD, Hilaria Ota   . Sleep apnea    USES CPAP    Past Surgical History:  Procedure Laterality Date  . CARPAL TUNNEL RELEASE Right   . CYSTECTOMY    . EYE SURGERY    . KNEE ARTHROSCOPY     LEFT LAT MENISCUS TEAR  . KNEE SURGERY     LEFT KNEE CAP WIRING AND PATELLAR REATTACHMENT  . LEFT HEART CATHETERIZATION WITH CORONARY ANGIOGRAM N/A 10/02/2014   Procedure: LEFT HEART CATHETERIZATION WITH CORONARY ANGIOGRAM;  Surgeon: Sanda Klein, MD;  Location: [facility] CATH LAB;  Service: Cardiovascular;  Laterality: N/A;  . ORCHIECTOMY     MALDEVELOPEMENT AFTER ORCHITIS  . POLYPECTOMY     OF PENIS    Family History  Problem Relation Age of Onset  . Fibromyalgia Mother   . Depression Mother   . Cancer Father  PROSTATECTOMY  . Stroke Father   . Cancer Sister        CERVICAL  . Stroke Paternal Grandfather   . Hypertension Paternal Grandfather   . Diabetes Paternal Grandfather     Past Medical History, Surgical History, Social History, Family History, Problem List, Medications, and Allergies have been reviewed and updated if relevant.  Review of Systems: Pertinent positives are listed above.  Otherwise, a full 14 point review of systems has been done in full and it is negative except where it is noted positive.  Objective:   BP 120/70    Pulse 68   Temp 98.4 F (36.9 C) (Temporal)   Ht 5\' 11"  (1.803 m)   Wt 285 lb 12 oz (129.6 kg)   SpO2 94%   BMI 39.85 kg/m  Ideal Body Weight: Weight in (lb) to have BMI = 25: 178.9  Ideal Body Weight: Weight in (lb) to have BMI = 25: 178.9 No exam data present Depression screen [facility] 2/9 12/19/2018 12/13/2017  Decreased Interest 0 0  Down, Depressed, Hopeless 1 0  PHQ - 2 Score 1 0     GEN: well developed, well nourished, no acute distress Eyes: conjunctiva and lids normal, PERRLA, EOMI ENT: TM clear, nares clear, oral exam WNL Neck: supple, no lymphadenopathy, no thyromegaly, no JVD Pulm: clear to auscultation and percussion, respiratory effort normal CV: regular rate and rhythm, S1-S2, no murmur, rub or gallop, no bruits, peripheral pulses normal and symmetric, no cyanosis, clubbing, edema or varicosities GI: soft, non-tender; no hepatosplenomegaly, masses; active bowel sounds all quadrants GU: no hernia, testicular mass, penile discharge Lymph: no cervical, axillary or inguinal adenopathy MSK: gait normal, muscle tone and strength WNL, no joint swelling, effusions, discoloration, crepitus  SKIN: clear, good turgor, color WNL, no rashes, lesions, or ulcerations Neuro: normal mental status, normal strength, sensation, and motion Psych: alert; oriented to person, place and time, normally interactive and not anxious or depressed in appearance.  All labs reviewed with patient. Results for orders placed or performed in visit on 12/30/19  PSA, Total with Reflex to PSA, Free  Result Value Ref Range   PSA, Total 0.4 < OR = 4.0 ng/mL  Hemoglobin A1c  Result Value Ref Range   Hgb A1c MFr Bld 6.1 4.6 - 6.5 %  CBC with Differential/Platelet  Result Value Ref Range   WBC 5.3 4.0 - 10.5 K/uL   RBC 4.80 4.22 - 5.81 Mil/uL   Hemoglobin 15.2 13.0 - 17.0 g/dL   HCT 45.3 39.0 - 52.0 %   MCV 94.5 78.0 - 100.0 fl   MCHC 33.5 30.0 - 36.0 g/dL   RDW 13.7 11.5 - 15.5 %   Platelets 249.0  150.0 - 400.0 K/uL   Neutrophils Relative % 59.1 43.0 - 77.0 %   Lymphocytes Relative 26.3 12.0 - 46.0 %   Monocytes Relative 12.2 (H) 3.0 - 12.0 %   Eosinophils Relative 1.8 0.0 - 5.0 %   Basophils Relative 0.6 0.0 - 3.0 %   Neutro Abs 3.2 1.4 - 7.7 K/uL   Lymphs Abs 1.4 0.7 - 4.0 K/uL   Monocytes Absolute 0.7 0.1 - 1.0 K/uL   Eosinophils Absolute 0.1 0.0 - 0.7 K/uL   Basophils Absolute 0.0 0.0 - 0.1 K/uL  Basic metabolic panel  Result Value Ref Range   Sodium 138 135 - 145 mEq/L   Potassium 4.8 3.5 - 5.1 mEq/L   Chloride 104 96 - 112 mEq/L   CO2 28 19 - 32  mEq/L   Glucose, Bld 106 (H) 70 - 99 mg/dL   BUN 17 6 - 23 mg/dL   Creatinine, Ser 1.10 0.40 - 1.50 mg/dL   GFR 67.95 >60.00 mL/min   Calcium 9.1 8.4 - 10.5 mg/dL  Hepatic function panel  Result Value Ref Range   Total Bilirubin 0.4 0.2 - 1.2 mg/dL   Bilirubin, Direct 0.1 0.0 - 0.3 mg/dL   Alkaline Phosphatase 52 39 - 117 U/L   AST 27 0 - 37 U/L   ALT 21 0 - 53 U/L   Total Protein 6.4 6.0 - 8.3 g/dL   Albumin 4.2 3.5 - 5.2 g/dL  Lipid panel  Result Value Ref Range   Cholesterol 166 0 - 200 mg/dL   Triglycerides 127.0 0.0 - 149.0 mg/dL   HDL 41.80 >39.00 mg/dL   VLDL 25.4 0.0 - 40.0 mg/dL   LDL Cholesterol 99 0 - 99 mg/dL   Total CHOL/HDL Ratio 4    NonHDL 123.95     Assessment and Plan:     ICD-10-CM   1. Healthcare maintenance  Z00.00    R scalp ? BCC, he has a dermatologist that he will f/u with  I tried my best to convince him to get a Covid-19 vaccine  Health Maintenance Exam: The patient's preventative maintenance and recommended screening tests for an annual wellness exam were reviewed in full today. Brought up to date unless services declined.  Counselled on the importance of diet, exercise, and its role in overall health and mortality. The patient's FH and SH was reviewed, including their home life, tobacco status, and drug and alcohol status.  Follow-up in 1 year for physical exam or additional  follow-up below.  Follow-up: No follow-ups on file. Or follow-up in 1 year if not noted.  No orders of the defined types were placed in this encounter.  There are no discontinued medications. No orders of the defined types were placed in this encounter.   Signed,  Maud Deed. Chalonda Schlatter, MD   Allergies as of 01/06/2020      Reactions   Codeine    REACTION: itching   Penicillins    REACTION: UNSPECIFIED      Medication List       Accurate as of January 06, 2020  8:57 AM. If you have any questions, ask your nurse or doctor.        aspirin 81 MG tablet Take 81 mg by mouth daily.   diclofenac sodium 1 % Gel Commonly known as: VOLTAREN Apply 4 g topically 4 (four) times daily as needed. Apply to large joint PRN   escitalopram 20 MG tablet Commonly known as: LEXAPRO TAKE ONE TABLET BY MOUTH DAILY   OVER THE COUNTER MEDICATION CBD OIL 1500 mg- 2 ml daily   pantoprazole 40 MG tablet Commonly known as: PROTONIX TAKE ONE TABLET BY MOUTH DAILY   simvastatin 40 MG tablet Commonly known as: ZOCOR TAKE ONE TABLET BY MOUTH EVERY NIGHT AT BEDTIME

## 2020-01-06 ENCOUNTER — Encounter: Payer: Self-pay | Admitting: Family Medicine

## 2020-01-06 ENCOUNTER — Ambulatory Visit: Payer: Self-pay | Admitting: Family Medicine

## 2020-01-06 ENCOUNTER — Other Ambulatory Visit: Payer: Self-pay

## 2020-01-06 VITALS — BP 120/70 | HR 68 | Temp 98.4°F | Ht 71.0 in | Wt 285.8 lb

## 2020-01-06 DIAGNOSIS — Z0000 Encounter for general adult medical examination without abnormal findings: Secondary | ICD-10-CM

## 2020-02-09 ENCOUNTER — Other Ambulatory Visit: Payer: Self-pay | Admitting: Family Medicine

## 2020-05-12 ENCOUNTER — Other Ambulatory Visit: Payer: Self-pay | Admitting: Family Medicine

## 2020-05-13 ENCOUNTER — Other Ambulatory Visit: Payer: Self-pay | Admitting: Family Medicine

## 2020-05-19 ENCOUNTER — Ambulatory Visit: Payer: Self-pay | Admitting: Orthopedic Surgery

## 2020-06-03 DIAGNOSIS — U071 COVID-19: Secondary | ICD-10-CM

## 2020-06-03 DIAGNOSIS — J189 Pneumonia, unspecified organism: Secondary | ICD-10-CM

## 2020-06-03 HISTORY — DX: COVID-19: U07.1

## 2020-06-03 HISTORY — DX: Pneumonia, unspecified organism: J18.9

## 2020-06-17 ENCOUNTER — Ambulatory Visit: Payer: Self-pay | Admitting: Orthopaedic Surgery

## 2020-06-19 ENCOUNTER — Encounter (HOSPITAL_COMMUNITY): Payer: Self-pay

## 2020-06-19 ENCOUNTER — Other Ambulatory Visit: Payer: Self-pay

## 2020-06-19 ENCOUNTER — Telehealth (INDEPENDENT_AMBULATORY_CARE_PROVIDER_SITE_OTHER): Payer: HRSA Program | Admitting: Family Medicine

## 2020-06-19 ENCOUNTER — Ambulatory Visit (HOSPITAL_COMMUNITY)
Admission: EM | Admit: 2020-06-19 | Discharge: 2020-06-19 | Disposition: A | Discharge status: Home / Self Care | Payer: HRSA Program | Attending: Emergency Medicine | Admitting: Emergency Medicine

## 2020-06-19 ENCOUNTER — Ambulatory Visit (INDEPENDENT_AMBULATORY_CARE_PROVIDER_SITE_OTHER): Payer: HRSA Program

## 2020-06-19 ENCOUNTER — Encounter: Payer: Self-pay | Admitting: Family Medicine

## 2020-06-19 VITALS — BP 120/75 | HR 66 | Temp 98.0°F | Ht 71.0 in | Wt 275.0 lb

## 2020-06-19 DIAGNOSIS — U071 COVID-19: Secondary | ICD-10-CM | POA: Diagnosis not present

## 2020-06-19 DIAGNOSIS — E860 Dehydration: Secondary | ICD-10-CM

## 2020-06-19 DIAGNOSIS — R1033 Periumbilical pain: Secondary | ICD-10-CM | POA: Diagnosis not present

## 2020-06-19 DIAGNOSIS — R0602 Shortness of breath: Secondary | ICD-10-CM

## 2020-06-19 DIAGNOSIS — J1282 Pneumonia due to coronavirus disease 2019: Secondary | ICD-10-CM | POA: Insufficient documentation

## 2020-06-19 DIAGNOSIS — R1030 Lower abdominal pain, unspecified: Secondary | ICD-10-CM

## 2020-06-19 DIAGNOSIS — R109 Unspecified abdominal pain: Secondary | ICD-10-CM | POA: Insufficient documentation

## 2020-06-19 LAB — CBC WITH DIFFERENTIAL/PLATELET
Abs Immature Granulocytes: 0.04 10*3/uL (ref 0.00–0.07)
Basophils Absolute: 0 10*3/uL (ref 0.0–0.1)
Basophils Relative: 0 %
Eosinophils Absolute: 0 10*3/uL (ref 0.0–0.5)
Eosinophils Relative: 0 %
HCT: 44.1 % (ref 39.0–52.0)
Hemoglobin: 14.6 g/dL (ref 13.0–17.0)
Immature Granulocytes: 1 %
Lymphocytes Relative: 7 %
Lymphs Abs: 0.4 10*3/uL — ABNORMAL LOW (ref 0.7–4.0)
MCH: 29.5 pg (ref 26.0–34.0)
MCHC: 33.1 g/dL (ref 30.0–36.0)
MCV: 89.1 fL (ref 80.0–100.0)
Monocytes Absolute: 0.3 10*3/uL (ref 0.1–1.0)
Monocytes Relative: 6 %
Neutro Abs: 4.3 10*3/uL (ref 1.7–7.7)
Neutrophils Relative %: 86 %
Platelets: 168 10*3/uL (ref 150–400)
RBC: 4.95 MIL/uL (ref 4.22–5.81)
RDW: 13 % (ref 11.5–15.5)
WBC: 4.9 10*3/uL (ref 4.0–10.5)
nRBC: 0 % (ref 0.0–0.2)

## 2020-06-19 LAB — COMPREHENSIVE METABOLIC PANEL
ALT: 37 U/L (ref 0–44)
AST: 83 U/L — ABNORMAL HIGH (ref 15–41)
Albumin: 3.3 g/dL — ABNORMAL LOW (ref 3.5–5.0)
Alkaline Phosphatase: 39 U/L (ref 38–126)
Anion gap: 11 (ref 5–15)
BUN: 7 mg/dL — ABNORMAL LOW (ref 8–23)
CO2: 25 mmol/L (ref 22–32)
Calcium: 8.3 mg/dL — ABNORMAL LOW (ref 8.9–10.3)
Chloride: 92 mmol/L — ABNORMAL LOW (ref 98–111)
Creatinine, Ser: 0.98 mg/dL (ref 0.61–1.24)
GFR calc Af Amer: >60 mL/min (ref >60)
GFR calc non Af Amer: >60 mL/min (ref >60)
Glucose, Bld: 117 mg/dL — ABNORMAL HIGH (ref 70–99)
Potassium: 4.3 mmol/L (ref 3.5–5.1)
Sodium: 128 mmol/L — ABNORMAL LOW (ref 135–145)
Total Bilirubin: 0.6 mg/dL (ref 0.3–1.2)
Total Protein: 6.8 g/dL (ref 6.5–8.1)

## 2020-06-19 MED ORDER — ONDANSETRON 4 MG PO TBDP
ORAL_TABLET | ORAL | Status: AC
  Filled 2020-06-19: qty 1

## 2020-06-19 MED ORDER — SODIUM CHLORIDE 0.9 % IV BOLUS
1000.0000 mL | Freq: Once | INTRAVENOUS | Status: AC
  Administered 2020-06-19: 1000 mL via INTRAVENOUS

## 2020-06-19 MED ORDER — ONDANSETRON 4 MG PO TBDP: 4.0000 mg | ORAL_TABLET | Freq: Three times a day (TID) | ORAL | 0 refills | Status: DC | PRN

## 2020-06-19 MED ORDER — ONDANSETRON 4 MG PO TBDP
4.0000 mg | ORAL_TABLET | Freq: Once | ORAL | Status: AC
  Administered 2020-06-19: 4 mg via ORAL

## 2020-06-19 NOTE — Discharge Instructions (Signed)
Your x-ray showed Covid pneumonia. I have put referral in for the antibody infusion clinic they should be contacting you within the next 24 to 48 hours. Zofran as needed for nausea, vomiting Make sure you are staying hydrated For worsening symptoms you will need to go to the ER. Make sure you are monitoring your O2 sats at home

## 2020-06-19 NOTE — Progress Notes (Signed)
Virtual visit completed through MyChart, a video enabled telemedicine application. Due to national recommendations of social distancing due to COVID-19, a virtual visit is felt to be most appropriate for this patient at this time. Reviewed limitations, risks, security and privacy concerns of performing a virtual visit and the availability of in person appointments. I also reviewed that there may be a patient responsible charge related to this service. The patient agreed to proceed.   Patient location: home  Provider location: [place] at [facility], office  Persons participating in this virtual visit: patient, provider   If any vitals were documented, they were collected by patient at home unless specified below.   BP 120/75   Pulse 66   Temp 98 F (36.7 C)   Ht 5\' 11"  (1.803 m)   Wt 275 lb (124.7 kg)   BMI 38.35 kg/m   BP Readings from Last 3 Encounters:  06/19/20 120/75  01/06/20 120/70  02/07/19 111/70    CC: abd pain with loose stools - positive COVID-19 Subjective:    Patient ID: Bryan Kirby, male    DOB: 10/22/1955, 64 y.o.   MRN: [id number]  HPI: Bryan Kirby is a 64 y.o. male presenting on 06/19/2020 for Diarrhea (C/o diarrhea, abd pain, low grade fever, body aches and HA.  Sxs started 06/12/20.  COVID+ results 06/15/20.  Tried Tylenol for fever/body aches.  )   Patient of Dr Copland's new to me.  Known h/o GERD, h/o DVT 2000, HLD, OSA on CPAP. Ex smoker quit 1975.   First day of symptoms 06/12/2020 Positive COVID swab 06/13/2020 Wife also with COVID symptoms - seems to be improving.   Predominant GI symptoms - low grade fever, body aches, HA, abd pain and diarrhea - loose/watery 1-2x/day. Mid umbilical abd pain acutely worse in the last 24 hours. No appetite. Tmax last Friday 102.5. Trouble sleeping due to abd pain. Mild cough.   No blood in stool. No nausea. GERD symptoms.  No dyspnea, wheeze, loss of taste or smell.   Managing with tylenol. Taking vit C.    Feels he's staying hydrated.   Interested in mAb infusion treatment.   COVID vaccine - has not gotten.      Relevant past medical, surgical, family and social history reviewed and updated as indicated. Interim medical history since our last visit reviewed. Allergies and medications reviewed and updated. Outpatient Medications Prior to Visit  Medication Sig Dispense Refill  . aspirin 81 MG tablet Take 81 mg by mouth daily.    . diclofenac sodium (VOLTAREN) 1 % GEL Apply 4 g topically 4 (four) times daily as needed. Apply to large joint PRN 3 Tube 4  . escitalopram (LEXAPRO) 20 MG tablet TAKE ONE TABLET BY MOUTH DAILY 90 tablet 3  . pantoprazole (PROTONIX) 40 MG tablet TAKE ONE TABLET BY MOUTH DAILY 90 tablet 1  . simvastatin (ZOCOR) 40 MG tablet TAKE ONE TABLET BY MOUTH AT BEDTIME 90 tablet 1  . OVER THE COUNTER MEDICATION CBD OIL 1500 mg- 2 ml daily     No facility-administered medications prior to visit.     Per HPI unless specifically indicated in ROS section below Review of Systems Objective:  BP 120/75   Pulse 66   Temp 98 F (36.7 C)   Ht 5\' 11"  (1.803 m)   Wt 275 lb (124.7 kg)   BMI 38.35 kg/m   Wt Readings from Last 3 Encounters:  06/19/20 275 lb (124.7 kg)  01/06/20 285 lb  12 oz (129.6 kg)  02/07/19 280 lb (127 kg)       Physical exam: Gen: alert, sitting in recliner uncomfortable appearing Pulm: speaks in complete sentences without increased work of breathing Psych: normal mood, normal thought content      Assessment & Plan:   Problem List Items Addressed This Visit    COVID-19 virus infection - Primary    Supportive care reviewed rec mAb infusion - he is interested - I will report info to infusion team.  Red flags to seek ER care reviewed as well. Did recommend he add vit D and zinc to regimen.      Abdominal pain    Midline abd pain associated with loose stools in setting of COVID infection, acutely worse in the past 24 hours. This is despite his  regular protonix 40mg  daily. No significant nausea, GERD. Given acute worsening, I did recommend being seen in person at [facility] today for further evaluation. He will go to [place] in [place]. We will call to notify them pt is on the way.  I wonder if mAb infusion is available at [facility].  I did ask him to take positive COVID test result with him to [facility].          No orders of the defined types were placed in this encounter.  No orders of the defined types were placed in this encounter.   I discussed the assessment and treatment plan with the patient. The patient was provided an opportunity to ask questions and all were answered. The patient agreed with the plan and demonstrated an understanding of the instructions. The patient was advised to call back or seek an in-person evaluation if the symptoms worsen or if the condition fails to improve as anticipated.  Follow up plan: No follow-ups on file.  Ria Bush, MD

## 2020-06-19 NOTE — ED Provider Notes (Signed)
[facility]    CSN: [id number] Arrival date & time: 06/19/20  1121      History   Chief Complaint Chief Complaint  Patient presents with  . Abdominal Pain    + COVID  . Dizziness    HPI Bryan Kirby is a 64 y.o. male.   Patient is a 64 year old male past medical history of sleep apnea, depression, GERD, hyperlipidemia, DVT.  He presents today with generalized not feeling well, dizziness, weakness, nausea, abdominal discomfort and mild shortness of breath.  This started approximately 2 days ago.  Was tested for Covid with positive results.  Denies any specific coughing, fevers, nasal congestion, rhinorrhea.  Mildly tachypneic care today with oxygen at 100%.  He is also has some diarrhea and feels like he may be dehydrated.     Past Medical History:  Diagnosis Date  . Allergic rhinitis due to pollen   . Colon polyps   . DVT (deep venous thrombosis) (HCC) 2000   Right leg  . Family history of adverse reaction to anesthesia    " MY MOTHER "   . GERD (gastroesophageal reflux disease)   . Hyperlipidemia   . Major depression in partial remission ([place]) 03/16/2007   Qualifier: Diagnosis of  By: Council Mechanic MD, Hilaria Ota   . Sleep apnea    USES CPAP    Patient Active Problem List   Diagnosis Date Noted  . COVID-19 virus infection 06/19/2020  . Abdominal pain 06/19/2020  . Chronic pain of right knee 02/07/2019  . Unilateral primary osteoarthritis, right knee 02/07/2019  . Lung nodule 09/30/2014  . OSA (obstructive sleep apnea) 12/20/2013  . Obesity (BMI 30-39.9) 08/27/2013  . Major depression in partial remission ([place]) 03/16/2007  . HLD (hyperlipidemia) 03/15/2007  . Allergic rhinitis 03/15/2007  . HERNIATED DISC  L1/2 R SIDE 03/15/2007    Past Surgical History:  Procedure Laterality Date  . CARPAL TUNNEL RELEASE Right   . CYSTECTOMY    . EYE SURGERY    . KNEE ARTHROSCOPY     LEFT LAT MENISCUS TEAR  . KNEE SURGERY     LEFT KNEE CAP WIRING AND PATELLAR  REATTACHMENT  . LEFT HEART CATHETERIZATION WITH CORONARY ANGIOGRAM N/A 10/02/2014   Procedure: LEFT HEART CATHETERIZATION WITH CORONARY ANGIOGRAM;  Surgeon: Sanda Klein, MD;  Location: [facility] CATH LAB;  Service: Cardiovascular;  Laterality: N/A;  . ORCHIECTOMY     MALDEVELOPEMENT AFTER ORCHITIS  . POLYPECTOMY     OF PENIS       Home Medications    Prior to Admission medications   Medication Sig Start Date End Date Taking? Authorizing Provider  aspirin 81 MG tablet Take 81 mg by mouth daily.    [provider]  diclofenac sodium (VOLTAREN) 1 % GEL Apply 4 g topically 4 (four) times daily as needed. Apply to large joint PRN 02/08/18   Garald Balding, MD  escitalopram (LEXAPRO) 20 MG tablet TAKE ONE TABLET BY MOUTH DAILY 02/10/20   Copland, Frederico Hamman, MD  ondansetron (ZOFRAN ODT) 4 MG disintegrating tablet Take 1 tablet (4 mg total) by mouth every 8 (eight) hours as needed for nausea or vomiting. 06/19/20   Loura Halt A, NP  pantoprazole (PROTONIX) 40 MG tablet TAKE ONE TABLET BY MOUTH DAILY 05/12/20   Copland, Frederico Hamman, MD  simvastatin (ZOCOR) 40 MG tablet TAKE ONE TABLET BY MOUTH AT BEDTIME 05/13/20   Copland, Frederico Hamman, MD    Family History Family History  Problem Relation Age of Onset  .  Fibromyalgia Mother   . Depression Mother   . Cancer Father        PROSTATECTOMY  . Stroke Father   . Cancer Sister        CERVICAL  . Stroke Paternal Grandfather   . Hypertension Paternal Grandfather   . Diabetes Paternal Grandfather     Social History Social History   Tobacco Use  . Smoking status: Former Smoker    Packs/day: 0.50    Years: 1.00    Pack years: 0.50    Types: Cigarettes    Quit date: 10/03/1973    Years since quitting: 46.7  . Smokeless tobacco: Never Used  Vaping Use  . Vaping Use: Never used  Substance Use Topics  . Alcohol use: No  . Drug use: No     Allergies   Codeine and Penicillins   Review of Systems Review of Systems   Physical  Exam Triage Vital Signs ED Triage Vitals  Enc Vitals Group     BP 06/19/20 1155 125/72     Pulse Rate 06/19/20 1155 63     Resp 06/19/20 1155 (!) 25     Temp 06/19/20 1155 98.5 F (36.9 C)     Temp Source 06/19/20 1155 Oral     SpO2 06/19/20 1155 100 %     Weight --      Height --      Head Circumference --      Peak Flow --      Pain Score 06/19/20 1154 8     Pain Loc --      Pain Edu? --      Excl. in [place]? --    No data found.  Updated Vital Signs BP 125/72 (BP Location: Left Arm)   Pulse 63   Temp 98.5 F (36.9 C) (Oral)   Resp (!) 25   SpO2 100%   Visual Acuity Right Eye Distance:   Left Eye Distance:   Bilateral Distance:    Right Eye Near:   Left Eye Near:    Bilateral Near:     Physical Exam Vitals and nursing note reviewed.  Constitutional:      General: He is not in acute distress.    Appearance: Normal appearance. He is ill-appearing. He is not toxic-appearing or diaphoretic.  HENT:     Head: Normocephalic and atraumatic.     Nose: Nose normal.  Eyes:     Conjunctiva/sclera: Conjunctivae normal.  Cardiovascular:     Rate and Rhythm: Normal rate and regular rhythm.     Heart sounds: Normal heart sounds.  Pulmonary:     Effort: Pulmonary effort is normal.     Breath sounds: Normal breath sounds.     Comments: Mild tachypnea Abdominal:     Palpations: Abdomen is soft.     Tenderness: There is abdominal tenderness in the epigastric area. There is no right CVA tenderness, left CVA tenderness, guarding or rebound.  Musculoskeletal:        General: Normal range of motion.     Cervical back: Normal range of motion.  Skin:    General: Skin is warm and dry.  Neurological:     Mental Status: He is alert.  Psychiatric:        Mood and Affect: Mood normal.      UC Treatments / Results  Labs (all labs ordered are listed, but only abnormal results are displayed) Labs Reviewed  CBC WITH DIFFERENTIAL/PLATELET  COMPREHENSIVE METABOLIC PANEL  EKG   Radiology DG Abd Acute W/Chest  Result Date: 06/19/2020 CLINICAL DATA:  Shortness of breath and dehydration. Lower abdominal pain for a week. COVID-19 positive for a week. EXAM: X-RAY ABDOMEN 3 VIEW COMPARISON:  None. FINDINGS: The heart, hila, and mediastinum are normal. No pneumothorax. Mild bilateral patchy opacities, most prominent in the left base, are identified. No other abnormalities in the chest. No free air, portal venous gas, or pneumatosis. No evidence of bowel obstruction. No renal or ureteral stones noted. IMPRESSION: 1. Bilateral pulmonary opacities, left greater than right, worrisome for developing infiltrates/multifocal pneumonia given the patient's COVID-19 positive status. 2. No acute abnormalities in the abdomen noted. Electronically Signed   By: Dorise Bullion III M.D   On: 06/19/2020 15:05    Procedures Procedures (including critical care time)  Medications Ordered in UC Medications  ondansetron (ZOFRAN-ODT) disintegrating tablet 4 mg (4 mg Oral Given 06/19/20 1230)  sodium chloride 0.9 % bolus 1,000 mL (0 mLs Intravenous Stopped 06/19/20 1543)    Initial Impression / Assessment and Plan / UC Course  I have reviewed the triage vital signs and the nursing notes.  Pertinent labs & imaging results that were available during my care of the patient were reviewed by me and considered in my medical decision making (see chart for details).      Covid pneumonia Patient with bilateral pulmonary opacities, left greater than right on x-ray. He has mild tachypnea today.  Oxygen saturations are 100%. Patient with dizziness most likely due to dehydration.  Rehydrated with normal saline bolus here today. EKG with normal sinus rhythm and normal rate. Patient referred to the antibody infusion clinic. They should be contacting him within the next 24 to 48 hours. Believe he will benefit from this. Recommend monitor his oxygen saturations at home and for worsening  symptoms to go to the ER. Patient understanding agree.  Final Clinical Impressions(s) / UC Diagnoses   Final diagnoses:  Pneumonia due to COVID-19 virus     Discharge Instructions     Your x-ray showed Covid pneumonia. I have put referral in for the antibody infusion clinic they should be contacting you within the next 24 to 48 hours. Zofran as needed for nausea, vomiting Make sure you are staying hydrated For worsening symptoms you will need to go to the ER. Make sure you are monitoring your O2 sats at home     ED Prescriptions    Medication Sig Dispense Auth. Provider   ondansetron (ZOFRAN ODT) 4 MG disintegrating tablet Take 1 tablet (4 mg total) by mouth every 8 (eight) hours as needed for nausea or vomiting. 20 tablet Loura Halt A, NP     PDMP not reviewed this encounter.   Orvan July, NP 06/19/20 1556

## 2020-06-19 NOTE — ED Triage Notes (Signed)
Pt presents with abdominal pain x 2 days; started feeling dizziness 1 hr ago, every time he try to stand up. Denies chest pain, sob, fever.

## 2020-06-19 NOTE — Assessment & Plan Note (Addendum)
Supportive care reviewed rec mAb infusion - he is interested - I will report info to infusion team.  Red flags to seek ER care reviewed as well. Did recommend he add vit D and zinc to regimen.

## 2020-06-19 NOTE — Assessment & Plan Note (Addendum)
Midline abd pain associated with loose stools in setting of COVID infection, acutely worse in the past 24 hours. This is despite his regular protonix 40mg  daily. No significant nausea, GERD. Given acute worsening, I did recommend being seen in person at [facility] today for further evaluation. He will go to [facility] in [place]. We will call to notify them pt is on the way.  I wonder if mAb infusion is available at [facility].  I did ask him to take positive COVID test result with him to [facility].

## 2020-06-20 ENCOUNTER — Other Ambulatory Visit (HOSPITAL_COMMUNITY): Payer: Self-pay | Admitting: Oncology

## 2020-06-20 ENCOUNTER — Encounter: Payer: Self-pay | Admitting: Oncology

## 2020-06-20 DIAGNOSIS — U071 COVID-19: Secondary | ICD-10-CM

## 2020-06-20 NOTE — Progress Notes (Signed)
I connected by phone with  Mr. Dunlevy to discuss the potential use of an new treatment for mild to moderate COVID-19 viral infection in non-hospitalized patients.   This patient is a age/sex that meets the FDA criteria for Emergency Use Authorization of casirivimab\imdevimab.  Has a (+) direct SARS-CoV-2 viral test result 1. Has mild or moderate COVID-19  2. Is ? 64 years of age and weighs ? 40 kg 3. Is NOT hospitalized due to COVID-19 4. Is NOT requiring oxygen therapy or requiring an increase in baseline oxygen flow rate due to COVID-19 5. Is within 10 days of symptom onset 6. Has at least one of the high risk factor(s) for progression to severe COVID-19 and/or hospitalization as defined in EUA. ? Specific high risk criteria : Past Medical History:  Diagnosis Date  . Allergic rhinitis due to pollen   . Colon polyps   . DVT (deep venous thrombosis) (HCC) 2000   Right leg  . Family history of adverse reaction to anesthesia    " MY MOTHER "   . GERD (gastroesophageal reflux disease)   . Hyperlipidemia   . 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 Sleep apnea    USES CPAP  ?  ?    Symptom onset  06/12/2020   I have spoken and communicated the following to the patient or parent/caregiver:   1. FDA has authorized the emergency use of casirivimab\imdevimab for the treatment of mild to moderate COVID-19 in adults and pediatric patients with positive results of direct SARS-CoV-2 viral testing who are 45 years of age and older weighing at least 40 kg, and who are at high risk for progressing to severe COVID-19 and/or hospitalization.   2. The significant known and potential risks and benefits of casirivimab\imdevimab, and the extent to which such potential risks and benefits are unknown.   3. Information on available alternative treatments and the risks and benefits of those alternatives, including clinical trials.    4. Patients treated with casirivimab\imdevimab should continue to self-isolate and use infection control measures (e.g., wear mask, isolate, social distance, avoid sharing personal items, clean and disinfect "high touch" surfaces, and frequent handwashing) according to CDC guidelines.    5. The patient or parent/caregiver has the option to accept or refuse casirivimab\imdevimab .   After reviewing this information with the patient, The patient agreed to proceed with receiving casirivimab\imdevimab infusion and will be provided a copy of the Fact sheet prior to receiving the infusion.Rulon Abide, AGNP-C [phone] ([place])

## 2020-06-21 ENCOUNTER — Other Ambulatory Visit (HOSPITAL_COMMUNITY): Payer: Self-pay

## 2020-06-21 ENCOUNTER — Ambulatory Visit (HOSPITAL_COMMUNITY)
Admission: RE | Admit: 2020-06-21 | Discharge: 2020-06-21 | Disposition: A | Discharge status: Home / Self Care | Payer: HRSA Program | Source: Ambulatory Visit | Attending: Pulmonary Disease | Admitting: Pulmonary Disease

## 2020-06-21 DIAGNOSIS — U071 COVID-19: Secondary | ICD-10-CM | POA: Diagnosis present

## 2020-06-21 MED ORDER — SODIUM CHLORIDE 0.9 % IV SOLN
1200.0000 mg | Freq: Once | INTRAVENOUS | Status: AC
  Administered 2020-06-21: 1200 mg via INTRAVENOUS

## 2020-06-21 MED ORDER — SODIUM CHLORIDE 0.9 % IV SOLN: INTRAVENOUS | Status: DC | PRN

## 2020-06-21 MED ORDER — METHYLPREDNISOLONE SODIUM SUCC 125 MG IJ SOLR: 125.0000 mg | Freq: Once | INTRAMUSCULAR | Status: DC | PRN

## 2020-06-21 MED ORDER — FAMOTIDINE IN NACL 20-0.9 MG/50ML-% IV SOLN: 20.0000 mg | Freq: Once | INTRAVENOUS | Status: DC | PRN

## 2020-06-21 MED ORDER — DIPHENHYDRAMINE HCL 50 MG/ML IJ SOLN: 50.0000 mg | Freq: Once | INTRAMUSCULAR | Status: DC | PRN

## 2020-06-21 MED ORDER — EPINEPHRINE 0.3 MG/0.3ML IJ SOAJ: 0.3000 mg | Freq: Once | INTRAMUSCULAR | Status: DC | PRN

## 2020-06-21 MED ORDER — ALBUTEROL SULFATE HFA 108 (90 BASE) MCG/ACT IN AERS: 2.0000 | INHALATION_SPRAY | Freq: Once | RESPIRATORY_TRACT | Status: DC | PRN

## 2020-06-21 NOTE — Progress Notes (Signed)
  Diagnosis: COVID-19  Physician: Dr. Tia Masker   Procedure: [facility] Med: casirivimab\imdevimab infusion - Provided patient with casirivimab\imdevimab fact sheet for patients, parents and caregivers prior to infusion.  Complications: No immediate complications noted.  Discharge: Discharged home   Bryan Kirby, [name] 06/21/2020

## 2020-06-21 NOTE — Discharge Instructions (Signed)

## 2020-06-22 ENCOUNTER — Telehealth: Payer: Self-pay

## 2020-06-22 ENCOUNTER — Encounter (HOSPITAL_COMMUNITY): Payer: Self-pay

## 2020-06-22 ENCOUNTER — Other Ambulatory Visit: Payer: Self-pay

## 2020-06-22 ENCOUNTER — Emergency Department (HOSPITAL_COMMUNITY): Payer: HRSA Program

## 2020-06-22 ENCOUNTER — Inpatient Hospital Stay (HOSPITAL_COMMUNITY)
Admission: EM | Admit: 2020-06-22 | Discharge: 2020-07-01 | DRG: 177 | Disposition: A | Discharge status: Home Health | Payer: HRSA Program | Attending: Internal Medicine | Admitting: Internal Medicine

## 2020-06-22 DIAGNOSIS — K219 Gastro-esophageal reflux disease without esophagitis: Secondary | ICD-10-CM | POA: Diagnosis present

## 2020-06-22 DIAGNOSIS — E875 Hyperkalemia: Secondary | ICD-10-CM | POA: Diagnosis not present

## 2020-06-22 DIAGNOSIS — R0602 Shortness of breath: Secondary | ICD-10-CM | POA: Diagnosis present

## 2020-06-22 DIAGNOSIS — Z8042 Family history of malignant neoplasm of prostate: Secondary | ICD-10-CM

## 2020-06-22 DIAGNOSIS — J9601 Acute respiratory failure with hypoxia: Secondary | ICD-10-CM | POA: Diagnosis present

## 2020-06-22 DIAGNOSIS — U071 COVID-19: Secondary | ICD-10-CM | POA: Diagnosis present

## 2020-06-22 DIAGNOSIS — E8770 Fluid overload, unspecified: Secondary | ICD-10-CM | POA: Diagnosis present

## 2020-06-22 DIAGNOSIS — Z885 Allergy status to narcotic agent status: Secondary | ICD-10-CM

## 2020-06-22 DIAGNOSIS — Z79899 Other long term (current) drug therapy: Secondary | ICD-10-CM

## 2020-06-22 DIAGNOSIS — F419 Anxiety disorder, unspecified: Secondary | ICD-10-CM | POA: Diagnosis present

## 2020-06-22 DIAGNOSIS — E669 Obesity, unspecified: Secondary | ICD-10-CM | POA: Diagnosis present

## 2020-06-22 DIAGNOSIS — Z6838 Body mass index (BMI) 38.0-38.9, adult: Secondary | ICD-10-CM | POA: Diagnosis not present

## 2020-06-22 DIAGNOSIS — F329 Major depressive disorder, single episode, unspecified: Secondary | ICD-10-CM | POA: Diagnosis present

## 2020-06-22 DIAGNOSIS — Z833 Family history of diabetes mellitus: Secondary | ICD-10-CM | POA: Diagnosis not present

## 2020-06-22 DIAGNOSIS — Z818 Family history of other mental and behavioral disorders: Secondary | ICD-10-CM

## 2020-06-22 DIAGNOSIS — Z87891 Personal history of nicotine dependence: Secondary | ICD-10-CM

## 2020-06-22 DIAGNOSIS — Z808 Family history of malignant neoplasm of other organs or systems: Secondary | ICD-10-CM | POA: Diagnosis not present

## 2020-06-22 DIAGNOSIS — Z7982 Long term (current) use of aspirin: Secondary | ICD-10-CM | POA: Diagnosis not present

## 2020-06-22 DIAGNOSIS — Z8249 Family history of ischemic heart disease and other diseases of the circulatory system: Secondary | ICD-10-CM

## 2020-06-22 DIAGNOSIS — G4733 Obstructive sleep apnea (adult) (pediatric): Secondary | ICD-10-CM | POA: Diagnosis present

## 2020-06-22 DIAGNOSIS — E871 Hypo-osmolality and hyponatremia: Secondary | ICD-10-CM | POA: Diagnosis present

## 2020-06-22 DIAGNOSIS — Z823 Family history of stroke: Secondary | ICD-10-CM | POA: Diagnosis not present

## 2020-06-22 DIAGNOSIS — E785 Hyperlipidemia, unspecified: Secondary | ICD-10-CM | POA: Diagnosis present

## 2020-06-22 DIAGNOSIS — Z86718 Personal history of other venous thrombosis and embolism: Secondary | ICD-10-CM

## 2020-06-22 DIAGNOSIS — J1282 Pneumonia due to coronavirus disease 2019: Secondary | ICD-10-CM | POA: Diagnosis present

## 2020-06-22 DIAGNOSIS — Z880 Allergy status to penicillin: Secondary | ICD-10-CM

## 2020-06-22 LAB — CBC WITH DIFFERENTIAL/PLATELET
Abs Immature Granulocytes: 0.07 10*3/uL (ref 0.00–0.07)
Basophils Absolute: 0 10*3/uL (ref 0.0–0.1)
Basophils Relative: 0 %
Eosinophils Absolute: 0 10*3/uL (ref 0.0–0.5)
Eosinophils Relative: 0 %
HCT: 40.7 % (ref 39.0–52.0)
Hemoglobin: 13.5 g/dL (ref 13.0–17.0)
Immature Granulocytes: 1 %
Lymphocytes Relative: 8 %
Lymphs Abs: 0.6 10*3/uL — ABNORMAL LOW (ref 0.7–4.0)
MCH: 29.5 pg (ref 26.0–34.0)
MCHC: 33.2 g/dL (ref 30.0–36.0)
MCV: 89.1 fL (ref 80.0–100.0)
Monocytes Absolute: 0.2 10*3/uL (ref 0.1–1.0)
Monocytes Relative: 3 %
Neutro Abs: 7 10*3/uL (ref 1.7–7.7)
Neutrophils Relative %: 88 %
Platelets: 211 10*3/uL (ref 150–400)
RBC: 4.57 MIL/uL (ref 4.22–5.81)
RDW: 13.3 % (ref 11.5–15.5)
WBC: 7.9 10*3/uL (ref 4.0–10.5)
nRBC: 0 % (ref 0.0–0.2)

## 2020-06-22 LAB — COMPREHENSIVE METABOLIC PANEL
ALT: 73 U/L — ABNORMAL HIGH (ref 0–44)
AST: 167 U/L — ABNORMAL HIGH (ref 15–41)
Albumin: 2.9 g/dL — ABNORMAL LOW (ref 3.5–5.0)
Alkaline Phosphatase: 40 U/L (ref 38–126)
Anion gap: 9 (ref 5–15)
BUN: 11 mg/dL (ref 8–23)
CO2: 24 mmol/L (ref 22–32)
Calcium: 8.1 mg/dL — ABNORMAL LOW (ref 8.9–10.3)
Chloride: 95 mmol/L — ABNORMAL LOW (ref 98–111)
Creatinine, Ser: 0.91 mg/dL (ref 0.61–1.24)
GFR calc Af Amer: >60 mL/min (ref >60)
GFR calc non Af Amer: >60 mL/min (ref >60)
Glucose, Bld: 93 mg/dL (ref 70–99)
Potassium: 4.6 mmol/L (ref 3.5–5.1)
Sodium: 128 mmol/L — ABNORMAL LOW (ref 135–145)
Total Bilirubin: 1 mg/dL (ref 0.3–1.2)
Total Protein: 6.4 g/dL — ABNORMAL LOW (ref 6.5–8.1)

## 2020-06-22 LAB — D-DIMER, QUANTITATIVE: D-Dimer, Quant: 2.8 ug/mL-FEU — ABNORMAL HIGH (ref 0.00–0.50)

## 2020-06-22 LAB — LACTATE DEHYDROGENASE: LDH: 491 U/L — ABNORMAL HIGH (ref 98–192)

## 2020-06-22 LAB — CBG MONITORING, ED: Glucose-Capillary: 131 mg/dL — ABNORMAL HIGH (ref 70–99)

## 2020-06-22 LAB — LACTIC ACID, PLASMA
Lactic Acid, Venous: 1.7 mmol/L (ref 0.5–1.9)
Lactic Acid, Venous: 1.7 mmol/L (ref 0.5–1.9)

## 2020-06-22 LAB — C-REACTIVE PROTEIN: CRP: 16.4 mg/dL — ABNORMAL HIGH (ref <1.0)

## 2020-06-22 LAB — SARS CORONAVIRUS 2 BY RT PCR (HOSPITAL ORDER, PERFORMED IN ~~LOC~~ HOSPITAL LAB): SARS Coronavirus 2: POSITIVE — AB

## 2020-06-22 LAB — FERRITIN: Ferritin: 438 ng/mL — ABNORMAL HIGH (ref 24–336)

## 2020-06-22 LAB — TRIGLYCERIDES: Triglycerides: 54 mg/dL (ref <150)

## 2020-06-22 LAB — FIBRINOGEN: Fibrinogen: 707 mg/dL — ABNORMAL HIGH (ref 210–475)

## 2020-06-22 LAB — BRAIN NATRIURETIC PEPTIDE: B Natriuretic Peptide: 192.8 pg/mL — ABNORMAL HIGH (ref 0.0–100.0)

## 2020-06-22 LAB — PROCALCITONIN: Procalcitonin: 0.2 ng/mL

## 2020-06-22 MED ORDER — SODIUM CHLORIDE 0.9 % IV SOLN
200.0000 mg | Freq: Once | INTRAVENOUS | Status: AC
  Administered 2020-06-22: 200 mg via INTRAVENOUS
  Filled 2020-06-22: qty 200

## 2020-06-22 MED ORDER — BARICITINIB 2 MG PO TABS
4.0000 mg | ORAL_TABLET | Freq: Every day | ORAL | Status: DC
  Administered 2020-06-22 – 2020-07-01 (×10): 4 mg via ORAL
  Filled 2020-06-22 (×10): qty 2

## 2020-06-22 MED ORDER — GUAIFENESIN-DM 100-10 MG/5ML PO SYRP
10.0000 mL | ORAL_SOLUTION | ORAL | Status: DC | PRN
  Administered 2020-06-23 – 2020-06-28 (×6): 10 mL via ORAL
  Filled 2020-06-22 (×6): qty 10

## 2020-06-22 MED ORDER — ZINC SULFATE 220 (50 ZN) MG PO CAPS
220.0000 mg | ORAL_CAPSULE | Freq: Every day | ORAL | Status: DC
  Administered 2020-06-22 – 2020-07-01 (×10): 220 mg via ORAL
  Filled 2020-06-22 (×10): qty 1

## 2020-06-22 MED ORDER — PREDNISONE 20 MG PO TABS: 50.0000 mg | ORAL_TABLET | Freq: Every day | ORAL | Status: DC

## 2020-06-22 MED ORDER — FUROSEMIDE 10 MG/ML IJ SOLN
20.0000 mg | Freq: Once | INTRAMUSCULAR | Status: AC
  Administered 2020-06-22: 20 mg via INTRAVENOUS
  Filled 2020-06-22: qty 4

## 2020-06-22 MED ORDER — MELATONIN 3 MG PO TABS
3.0000 mg | ORAL_TABLET | Freq: Every evening | ORAL | Status: DC | PRN
  Administered 2020-06-22 – 2020-06-28 (×4): 3 mg via ORAL
  Filled 2020-06-22 (×5): qty 1

## 2020-06-22 MED ORDER — ENOXAPARIN SODIUM 40 MG/0.4ML ~~LOC~~ SOLN
40.0000 mg | SUBCUTANEOUS | Status: DC
  Administered 2020-06-22: 40 mg via SUBCUTANEOUS
  Filled 2020-06-22: qty 0.4

## 2020-06-22 MED ORDER — ALBUTEROL SULFATE HFA 108 (90 BASE) MCG/ACT IN AERS
2.0000 | INHALATION_SPRAY | Freq: Four times a day (QID) | RESPIRATORY_TRACT | Status: DC
  Administered 2020-06-22 – 2020-06-27 (×18): 2 via RESPIRATORY_TRACT
  Filled 2020-06-22: qty 6.7

## 2020-06-22 MED ORDER — IPRATROPIUM-ALBUTEROL 20-100 MCG/ACT IN AERS: 1.0000 | INHALATION_SPRAY | Freq: Four times a day (QID) | RESPIRATORY_TRACT | Status: DC

## 2020-06-22 MED ORDER — ASCORBIC ACID 500 MG PO TABS
500.0000 mg | ORAL_TABLET | Freq: Every day | ORAL | Status: DC
  Administered 2020-06-22 – 2020-07-01 (×10): 500 mg via ORAL
  Filled 2020-06-22 (×9): qty 1

## 2020-06-22 MED ORDER — SIMVASTATIN 20 MG PO TABS: 40.0000 mg | ORAL_TABLET | Freq: Every day | ORAL | Status: DC

## 2020-06-22 MED ORDER — SODIUM CHLORIDE 0.9 % IV SOLN
100.0000 mg | Freq: Every day | INTRAVENOUS | Status: AC
  Administered 2020-06-23 – 2020-06-26 (×4): 100 mg via INTRAVENOUS
  Filled 2020-06-22 (×4): qty 20

## 2020-06-22 MED ORDER — SENNOSIDES-DOCUSATE SODIUM 8.6-50 MG PO TABS
1.0000 | ORAL_TABLET | Freq: Every day | ORAL | Status: DC
  Administered 2020-06-22 – 2020-06-30 (×9): 1 via ORAL
  Filled 2020-06-22 (×9): qty 1

## 2020-06-22 MED ORDER — IPRATROPIUM BROMIDE HFA 17 MCG/ACT IN AERS
2.0000 | INHALATION_SPRAY | Freq: Four times a day (QID) | RESPIRATORY_TRACT | Status: DC
  Administered 2020-06-22 – 2020-06-27 (×18): 2 via RESPIRATORY_TRACT
  Filled 2020-06-22: qty 12.9

## 2020-06-22 MED ORDER — FOLIC ACID 1 MG PO TABS
1.0000 mg | ORAL_TABLET | Freq: Every day | ORAL | Status: DC
  Administered 2020-06-22 – 2020-07-01 (×10): 1 mg via ORAL
  Filled 2020-06-22 (×10): qty 1

## 2020-06-22 MED ORDER — DEXAMETHASONE SODIUM PHOSPHATE 10 MG/ML IJ SOLN
10.0000 mg | Freq: Once | INTRAMUSCULAR | Status: AC
  Administered 2020-06-22: 10 mg via INTRAVENOUS
  Filled 2020-06-22: qty 1

## 2020-06-22 MED ORDER — HYDROCOD POLST-CPM POLST ER 10-8 MG/5ML PO SUER
5.0000 mL | Freq: Two times a day (BID) | ORAL | Status: DC | PRN
  Administered 2020-06-22 – 2020-06-30 (×5): 5 mL via ORAL
  Filled 2020-06-22 (×5): qty 5

## 2020-06-22 MED ORDER — VITAMIN D 25 MCG (1000 UNIT) PO TABS
1000.0000 [IU] | ORAL_TABLET | Freq: Every day | ORAL | Status: DC
  Administered 2020-06-22 – 2020-07-01 (×10): 1000 [IU] via ORAL
  Filled 2020-06-22 (×10): qty 1

## 2020-06-22 MED ORDER — ESCITALOPRAM OXALATE 20 MG PO TABS
20.0000 mg | ORAL_TABLET | Freq: Every day | ORAL | Status: DC
  Administered 2020-06-22 – 2020-07-01 (×10): 20 mg via ORAL
  Filled 2020-06-22 (×4): qty 1
  Filled 2020-06-22: qty 2
  Filled 2020-06-22 (×5): qty 1

## 2020-06-22 MED ORDER — PANTOPRAZOLE SODIUM 40 MG PO TBEC
40.0000 mg | DELAYED_RELEASE_TABLET | Freq: Every day | ORAL | Status: DC
  Administered 2020-06-22 – 2020-07-01 (×10): 40 mg via ORAL
  Filled 2020-06-22 (×10): qty 1

## 2020-06-22 MED ORDER — INSULIN ASPART 100 UNIT/ML ~~LOC~~ SOLN
0.0000 [IU] | Freq: Every day | SUBCUTANEOUS | Status: DC
  Administered 2020-06-25 – 2020-06-28 (×3): 2 [IU] via SUBCUTANEOUS
  Administered 2020-06-29: 3 [IU] via SUBCUTANEOUS
  Administered 2020-06-30: 2 [IU] via SUBCUTANEOUS
  Filled 2020-06-22: qty 0.05

## 2020-06-22 MED ORDER — METHYLPREDNISOLONE SODIUM SUCC 125 MG IJ SOLR
1.0000 mg/kg | Freq: Two times a day (BID) | INTRAMUSCULAR | Status: DC
  Administered 2020-06-22 – 2020-06-23 (×3): 125 mg via INTRAVENOUS
  Filled 2020-06-22 (×3): qty 2

## 2020-06-22 MED ORDER — INSULIN ASPART 100 UNIT/ML ~~LOC~~ SOLN
0.0000 [IU] | Freq: Three times a day (TID) | SUBCUTANEOUS | Status: DC
  Administered 2020-06-23 (×2): 2 [IU] via SUBCUTANEOUS
  Administered 2020-06-23 – 2020-06-24 (×2): 3 [IU] via SUBCUTANEOUS
  Administered 2020-06-24 (×2): 2 [IU] via SUBCUTANEOUS
  Administered 2020-06-25: 3 [IU] via SUBCUTANEOUS
  Administered 2020-06-25: 2 [IU] via SUBCUTANEOUS
  Administered 2020-06-25: 1 [IU] via SUBCUTANEOUS
  Administered 2020-06-26 (×2): 3 [IU] via SUBCUTANEOUS
  Administered 2020-06-26: 2 [IU] via SUBCUTANEOUS
  Administered 2020-06-27: 1 [IU] via SUBCUTANEOUS
  Administered 2020-06-27 (×2): 3 [IU] via SUBCUTANEOUS
  Administered 2020-06-28: 1 [IU] via SUBCUTANEOUS
  Administered 2020-06-28 (×2): 3 [IU] via SUBCUTANEOUS
  Administered 2020-06-29: 2 [IU] via SUBCUTANEOUS
  Administered 2020-06-29 – 2020-06-30 (×2): 5 [IU] via SUBCUTANEOUS
  Administered 2020-06-30 – 2020-07-01 (×2): 2 [IU] via SUBCUTANEOUS
  Filled 2020-06-22: qty 0.09

## 2020-06-22 MED ORDER — ASPIRIN EC 81 MG PO TBEC
81.0000 mg | DELAYED_RELEASE_TABLET | Freq: Every day | ORAL | Status: DC
  Administered 2020-06-22 – 2020-07-01 (×10): 81 mg via ORAL
  Filled 2020-06-22 (×10): qty 1

## 2020-06-22 MED ORDER — ADULT MULTIVITAMIN W/MINERALS CH
1.0000 | ORAL_TABLET | Freq: Every day | ORAL | Status: DC
  Administered 2020-06-22 – 2020-07-01 (×10): 1 via ORAL
  Filled 2020-06-22 (×10): qty 1

## 2020-06-22 NOTE — Telephone Encounter (Signed)
Immunization History  Administered Date(s) Administered   Influenza Split 08/22/2012   Influenza,inj,Quad PF,6+ Mos 08/27/2013, 07/01/2014, 09/16/2015, 09/04/2017   Td 05/10/1999   Tdap 04/08/2013    Prob to ER? But check how SOB has progressed over the last 2 days.  If he is noticeably more SOB, then I think the ER is the best option

## 2020-06-22 NOTE — Telephone Encounter (Signed)
Called patient back and was advised that his oxygen level now is at 83%. Patient stated that his wife is still at Urgent Care being seen. Patient stated that he decided to go ahead and call 911 and someone from the fire department is there now. Patient stated that he is opening the door for them now. Patient was advised that message will go back to Dr. Lorelei Pont for him to be aware of what is going on.

## 2020-06-22 NOTE — Telephone Encounter (Signed)
[facility] Night - Client TELEPHONE ADVICE RECORD [facility] Patient Name: Bryan Kirby Gender: Male DOB: 1955/10/13 Age: 64 Y 10 M 29 D Return Phone Number: [phone] (Primary) [place] [address] Client [place] Night - Client Client Site [place] Physician Ria Bush - MD Contact Type Call Who Is Calling Patient / Member / Family / Caregiver Call Type Triage / Clinical Relationship To Patient Self Return Phone Number [phone] (Primary) Chief Complaint BREATHING - shortness of breath or sounds breathless Reason for Call Symptomatic / Request for [place] states he had a urgent care visit yesterday he has covid pneumonia and dehydration. Caller states he has shortness of breath. Caller states hes waiting to hear if he can get the infusion but hasn't hears. Translation No Nurse Assessment Nurse: Valentino Nose, RN, [place] Date/Time (Eastern Time): 06/20/2020 8:07:42 AM Confirm and document reason for call. If symptomatic, describe symptoms. ---Caller states he was told he was COVID positive with pneumonia yesterday at [facility]. Was told he could have the infusion but has not heard anything else. Has the patient had close contact with a person known or suspected to have the novel coronavirus illness OR traveled / lives in area with major community spread (including international travel) in the last 14 days from the onset of symptoms? * If Asymptomatic, screen for exposure and travel within the last 14 days. ---Yes Does the patient have any new or worsening symptoms? ---Yes Will a triage be completed? ---Yes Related visit to physician within the last 2 weeks? ---Yes Does the PT have any chronic conditions? (i.e. diabetes, asthma, this includes High risk factors for pregnancy, etc.) ---No Is this a behavioral health or substance  abuse call? ---No Guidelines Guideline Title Affirmed Question Affirmed Notes Nurse Date/Time (Eastern Time) COVID-19 - Diagnosed or Suspected Difficult to awaken or acting confused (e.g., disoriented, slurred speech) Valentino Nose, [place], [place] 06/20/2020 8:08:34 AMPLEASE NOTE: All timestamps contained within this report are represented as [place] Standard Time. CONFIDENTIALTY NOTICE: This fax transmission is intended only for the addressee. It contains information that is legally privileged, confidential or otherwise protected from use or disclosure. If you are not the intended recipient, you are strictly prohibited from reviewing, disclosing, copying using or disseminating any of this information or taking any action in reliance on or regarding this information. If you have received this fax in error, please notify us immediately by telephone so that we can arrange for its return to [place]. Phone: [phone], Toll-Free: [phone], Fax: [phone] Page: 2 of 2 Call Id: [id number] [place]. Time Eilene Ghazi Time) Disposition Final User 06/20/2020 8:05:47 AM Send to Urgent Queue Peggye Fothergill 06/20/2020 8:22:56 AM 911 Outcome Documentation Valentino Nose, RN, [place] Reason: Attempted to call pt twice. No answer. 06/20/2020 8:09:33 AM Call EMS 911 Now Yes Valentino Nose, RN, [place] Disagree/Comply Comply Caller Understands Yes PreDisposition Call Doctor Care Advice Given Per Guideline CALL EMS 911 NOW: * Triager Discretion: I'll call you back in a few minutes to be sure you were able to reach them.

## 2020-06-22 NOTE — Progress Notes (Addendum)
Per bedside RNs Orland Jarred and Quin Hoop, patient decided that he does not want intubation or invasive mechanical ventilation.  He does "not want to be hooked up to any machines."  Code status changed to Limited code, NO INTUBATION.

## 2020-06-22 NOTE — Telephone Encounter (Signed)
Pt had infusion yesterday.

## 2020-06-22 NOTE — H&P (Signed)
History and Physical  Bryan Kirby [id number]:[id number] DOB: 01/29/1956 DOA: 06/22/2020  Referring physician: Margarita Mail PA  PCP: Owens Loffler, MD  Outpatient Specialists: None Patient coming from: Home  Chief Complaint: Shortness of breath  HPI: Bryan Kirby is a 64 y.o. male with medical history significant for obesity, OSA on CPAP, chronic anxiety/depression, HLD, GERD, recent diagnosis of covid-19 viral infection on 06/15/20 at [facility], had monoclonal antibodies on 06/21/20 and felt worse afterwards who presented with worsening dyspnea of 2 days duration.  Associated with persistent non productive cough, bodys aches, chills, poor appetite and generalized weakness.  Had diarrhea for 4 days prior to his first diagnosis of Covid-19 infection, GI symptoms have now resolved.  Admits to pleuritic chest pain, worse when he coughs.  Denies lower extremities edema or tenderness.  He ambulates freely at baseline.  At home his oxygen saturation was 86% on room air.  Concerned about his hypoxia he decided to come to the ED for further evaluation and management.    ED Course: Hypoxic and requiring 3L to maintain O2 sat >90%.  Elevated inflammatory markers.  Covid 19 screening test positive.  CRP >16, D-dimer >2.8.  CXR consistent with Covid-19 viral PNA.  Review of Systems: Review of systems as noted in the HPI. All other systems reviewed and are negative.   Past Medical History:  Diagnosis Date  . Allergic rhinitis due to pollen   . Colon polyps   . DVT (deep venous thrombosis) (HCC) 2000   Right leg  . Family history of adverse reaction to anesthesia    " MY MOTHER "   . GERD (gastroesophageal reflux disease)   . Hyperlipidemia   . 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 Sleep apnea    USES CPAP   Past Surgical History:  Procedure Laterality Date  . CARPAL TUNNEL RELEASE Right   . CYSTECTOMY    .  EYE SURGERY    . KNEE ARTHROSCOPY     LEFT LAT MENISCUS TEAR  . KNEE SURGERY     LEFT KNEE CAP WIRING AND PATELLAR REATTACHMENT  . LEFT HEART CATHETERIZATION WITH CORONARY ANGIOGRAM N/A 10/02/2014   Procedure: LEFT HEART CATHETERIZATION WITH CORONARY ANGIOGRAM;  Surgeon: Sanda Klein, MD;  Location: [facility];  Service: Cardiovascular;  Laterality: N/A;  . ORCHIECTOMY     MALDEVELOPEMENT AFTER ORCHITIS  . POLYPECTOMY     OF PENIS    Social History:  reports that he quit smoking about 46 years ago. His smoking use included cigarettes. He has a 0.50 pack-year smoking history. He has never used smokeless tobacco. He reports that he does not drink alcohol and does not use drugs.   Allergies  Allergen Reactions  . Codeine     REACTION: itching  . Penicillins     REACTION: UNSPECIFIED    Family History  Problem Relation Age of Onset  . Fibromyalgia Mother   . Depression Mother   . Cancer Father        PROSTATECTOMY  . Stroke Father   . Cancer Sister        CERVICAL  . Stroke Paternal Grandfather   . Hypertension Paternal Grandfather   . Diabetes Paternal Grandfather      Prior to Admission medications   Medication Sig Start Date End Date Taking? Authorizing Provider  aspirin 81 MG tablet Take 81 mg by mouth daily.  [provider]  diclofenac sodium (VOLTAREN) 1 % GEL Apply 4 g topically 4 (four) times daily as needed. Apply to large joint PRN 02/08/18   Garald Balding, MD  escitalopram (LEXAPRO) 20 MG tablet TAKE ONE TABLET BY MOUTH DAILY 02/10/20   Copland, Frederico Hamman, MD  ondansetron (ZOFRAN ODT) 4 MG disintegrating tablet Take 1 tablet (4 mg total) by mouth every 8 (eight) hours as needed for nausea or vomiting. 06/19/20   Loura Halt A, NP  pantoprazole (PROTONIX) 40 MG tablet TAKE ONE TABLET BY MOUTH DAILY 05/12/20   Copland, Frederico Hamman, MD  simvastatin (ZOCOR) 40 MG tablet TAKE ONE TABLET BY MOUTH AT BEDTIME 05/13/20   Copland, Frederico Hamman, MD    Physical Exam: BP  116/66   Pulse 66   Temp (!) 97.3 F (36.3 C) (Oral)   Resp 16   Ht 5\' 11"  (1.803 m)   Wt 124.7 kg   SpO2 97%   BMI 38.35 kg/m   . General: 64 y.o. year-old male well developed well nourished in no acute distress.  Alert and oriented x3.  Weak appearing. . Cardiovascular: Regular rate and rhythm with no rubs or gallops.  No thyromegaly or JVD noted.  No lower extremity edema. 2/4 pulses in all 4 extremities. Marland Kitchen Respiratory: Diffused rales and mild wheezes bilaterally. Good inspiratory effort. . Abdomen: Soft nontender nondistended with normal bowel sounds x4 quadrants. . Muskuloskeletal: No cyanosis, clubbing or edema noted bilaterally . Neuro: CN II-XII intact, strength, sensation, reflexes . Skin: No ulcerative lesions noted or rashes . Psychiatry: Judgement and insight appear normal. Mood is appropriate for condition and setting          Labs on Admission:  Basic Metabolic Panel: Recent Labs  Lab 06/19/20 1256 06/22/20 1430  NA 128* 128*  K 4.3 4.6  CL 92* 95*  CO2 25 24  GLUCOSE 117* 93  BUN 7* 11  CREATININE 0.98 0.91  CALCIUM 8.3* 8.1*   Liver Function Tests: Recent Labs  Lab 06/19/20 1256 06/22/20 1430  AST 83* 167*  ALT 37 73*  ALKPHOS 39 40  BILITOT 0.6 1.0  PROT 6.8 6.4*  ALBUMIN 3.3* 2.9*   No results for input(s): LIPASE, AMYLASE in the last 168 hours. No results for input(s): AMMONIA in the last 168 hours. CBC: Recent Labs  Lab 06/19/20 1256 06/22/20 1532  WBC 4.9 7.9  NEUTROABS 4.3 7.0  HGB 14.6 13.5  HCT 44.1 40.7  MCV 89.1 89.1  PLT 168 211   Cardiac Enzymes: No results for input(s): CKTOTAL, CKMB, CKMBINDEX, TROPONINI in the last 168 hours.  BNP (last 3 results) No results for input(s): BNP in the last 8760 hours.  ProBNP (last 3 results) No results for input(s): PROBNP in the last 8760 hours.  CBG: No results for input(s): GLUCAP in the last 168 hours.  Radiological Exams on Admission: DG Chest Port 1 View  Result Date:  06/22/2020 CLINICAL DATA:  Cough, shortness of breath, and weakness. Recent COVID-19 diagnosis a week ago. EXAM: PORTABLE CHEST 1 VIEW COMPARISON:  CTA chest dated September 30, 2014. Chest x-ray dated September 29, 2014. FINDINGS: The heart size and mediastinal contours are within normal limits. Normal pulmonary vascularity. Patchy interstitial and airspace opacities in the right mid lung and both lung bases. No pleural effusion or pneumothorax. No acute osseous abnormality. IMPRESSION: 1. Bilateral patchy interstitial and airspace disease consistent with multifocal COVID-19 pneumonia. Electronically Signed   By: Titus Dubin M.D.   On: 06/22/2020 14:43  EKG: I independently viewed the EKG done and my findings are as followed:  None available at the time of this visit.  Assessment/Plan Present on Admission: . Pneumonia due to COVID-19 virus  Active Problems:   Pneumonia due to COVID-19 virus  COVID-19 viral pneumonia Initial COVID-19 screening test done outpatient positive on 06/15/2020 as reported by the patient COVID-19 screening test done at [facility] ED positive on 06/22/2020 Personally reviewed chest x-ray done on admission which shows bilateral pulmonary infiltrates consistent with COVID-19 viral pneumonia. Received monoclonal antibodies on 06/21/2020 Start COVID-19 directed therapies, IV remdesivir x5 days, baricitinib x14 days, IV Solu-Medrol twice daily, bronchodilators, albuterol inhaler every 6 hours, Atrovent inhaler every 6 hours, pulmonary toilet. Start vitamin C, D3, and Zinc To note, patient gave consent to use Remdesivir and Baricitinib. Start incentive spirometer and mobilize as tolerated Pronate or side sleeping. Monitor inflammatory markers daily.  Acute hypoxic respiratory failure secondary to COVID-19 viral pneumonia Not on oxygen supplementation at baseline Currently requiring 3 L to maintain O2 saturation above 90% Continue to monitor and maintain O2 saturation  >92%  Hypervolemic hyponatremia Presented with serum sodium 128 Avoid IV fluids Gentle diuresing as needed to maintain net negative fluid  OSA CPAP at night  Chronic anxiety/depression Resume home regimen  Obesity BMI 38 Recommend weight loss outpatient with regular physical activity and healthy dieting.  Hyperlipidemia Hold home statin due to elevated AST  Generalized weakness, likely 2/2 to covid 19 viral infection At baseline, he ambulates freely with out assistance PT to assess Ambulate with assist and fall precaution        DVT prophylaxis: Subcu Lovenox daily  Code Status: Full code, as stated by the patient himself.   Family Communication: None at bedside  Disposition Plan: Telemetry unit  Consults called: None  Admission status: Inpatient status   Status is: Inpatient    Dispo:  Patient From: Home  Planned Disposition: Home  Expected discharge date: 06/24/20  Medically stable for discharge: No, ongoing management for COVID-19 viral pneumonia and acute hypoxic respiratory failure.        Kayleen Memos MD [facility] Pager [phone]  If 7PM-7AM, please contact night-coverage [email or web address] Password [email or web address]  06/22/2020, 5:14 PM

## 2020-06-22 NOTE — Progress Notes (Signed)
When doing pt's admission history, he states he does not want to be on life support. I explained to him that an admission doctor will be coming in to see him. I told him they always ask everyone regardless of what is wrong with you what your wishes are if anything was to happen. I told him to discuss any wishes or concerns he has when the doctor comes to see him. I also told pt's rn that patient states he does not want to be on life support. Lucius Conn BSN, RN-BC Admissions RN 06/22/2020 6:37 PM

## 2020-06-22 NOTE — ED Provider Notes (Signed)
[facility] DEPT Provider Note   CSN: [id number] Arrival date & time: 06/22/20  1304     History Chief Complaint  Patient presents with  . Shortness of Breath    Bryan Kirby is a 64 y.o. male past medical history of obesity, obstructive sleep apnea who presents emergency department with shortness of breath.  Patient has had 10 days of Covid-like symptoms and was diagnosed 7 days ago with coronavirus at social services.  Bryan Kirby states that Bryan Kirby had the monoclonal antibody infusion yesterday.  Bryan Kirby has had 2 days of worsening shortness of breath.  Bryan Kirby has associated body aches, chills, poor appetite, weakness.  HPI     Past Medical History:  Diagnosis Date  . Allergic rhinitis due to pollen   . Colon polyps   . DVT (deep venous thrombosis) (HCC) 2000   Right leg  . Family history of adverse reaction to anesthesia    " MY MOTHER "   . GERD (gastroesophageal reflux disease)   . Hyperlipidemia   . 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 Sleep apnea    USES CPAP    Patient Active Problem List   Diagnosis Date Noted  . COVID-19 virus infection 06/19/2020  . Abdominal pain 06/19/2020  . Chronic pain of right knee 02/07/2019  . Unilateral primary osteoarthritis, right knee 02/07/2019  . Lung nodule 09/30/2014  . OSA (obstructive sleep apnea) 12/20/2013  . Obesity (BMI 30-39.9) 08/27/2013  . 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 HLD (hyperlipidemia) 03/15/2007  . Allergic rhinitis 03/15/2007  . HERNIATED DISC  L1/2 R SIDE 03/15/2007    Past Surgical History:  Procedure Laterality Date  . CARPAL TUNNEL RELEASE Right   . CYSTECTOMY    . EYE SURGERY    . KNEE ARTHROSCOPY     LEFT LAT MENISCUS TEAR  . KNEE SURGERY     LEFT KNEE CAP WIRING AND PATELLAR REATTACHMENT  . LEFT HEART CATHETERIZATION WITH CORONARY ANGIOGRAM N/A 10/02/2014   Procedure: LEFT  HEART CATHETERIZATION WITH CORONARY ANGIOGRAM;  Surgeon: Sanda Klein, MD;  Location: [facility];  Service: Cardiovascular;  Laterality: N/A;  . ORCHIECTOMY     MALDEVELOPEMENT AFTER ORCHITIS  . POLYPECTOMY     OF PENIS       Family History  Problem Relation Age of Onset  . Fibromyalgia Mother   . Depression Mother   . Cancer Father        PROSTATECTOMY  . Stroke Father   . Cancer Sister        CERVICAL  . Stroke Paternal Grandfather   . Hypertension Paternal Grandfather   . Diabetes Paternal Grandfather     Social History   Tobacco Use  . Smoking status: Former Smoker    Packs/day: 0.50    Years: 1.00    Pack years: 0.50    Types: Cigarettes    Quit date: 10/03/1973    Years since quitting: 46.7  . Smokeless tobacco: Never Used  Vaping Use  . Vaping Use: Never used  Substance Use Topics  . Alcohol use: No  . Drug use: No    Home Medications Prior to Admission medications   Medication Sig Start Date End Date Taking? Authorizing Provider  aspirin 81 MG tablet Take 81 mg by mouth daily.    [provider]  diclofenac sodium (VOLTAREN) 1 % GEL Apply 4 g topically  4 (four) times daily as needed. Apply to large joint PRN 02/08/18   Garald Balding, MD  escitalopram (LEXAPRO) 20 MG tablet TAKE ONE TABLET BY MOUTH DAILY 02/10/20   Copland, Frederico Hamman, MD  ondansetron (ZOFRAN ODT) 4 MG disintegrating tablet Take 1 tablet (4 mg total) by mouth every 8 (eight) hours as needed for nausea or vomiting. 06/19/20   Loura Halt A, NP  pantoprazole (PROTONIX) 40 MG tablet TAKE ONE TABLET BY MOUTH DAILY 05/12/20   Copland, Frederico Hamman, MD  simvastatin (ZOCOR) 40 MG tablet TAKE ONE TABLET BY MOUTH AT BEDTIME 05/13/20   Copland, Frederico Hamman, MD    Allergies    Codeine and Penicillins  Review of Systems   Review of Systems Ten systems reviewed and are negative for acute change, except as noted in the HPI.   Physical Exam Updated Vital Signs BP 112/62   Pulse 63   Temp (!) 97.3  F (36.3 C) (Oral)   Resp (!) 22   Ht 5\' 11"  (1.803 m)   Wt 124.7 kg   SpO2 94%   BMI 38.35 kg/m   Physical Exam Vitals and nursing note reviewed.  Constitutional:      General: Bryan Kirby is not in acute distress.    Appearance: Bryan Kirby is well-developed. Bryan Kirby is ill-appearing. Bryan Kirby is not diaphoretic.  HENT:     Head: Normocephalic and atraumatic.  Eyes:     General: No scleral icterus.    Extraocular Movements: Extraocular movements intact.     Conjunctiva/sclera: Conjunctivae normal.     Pupils: Pupils are equal, round, and reactive to light.  Cardiovascular:     Rate and Rhythm: Normal rate and regular rhythm.     Heart sounds: Normal heart sounds.  Pulmonary:     Effort: Pulmonary effort is normal. Tachypnea present. No respiratory distress.     Breath sounds: Examination of the right-middle field reveals rales. Examination of the left-middle field reveals rales. Examination of the right-lower field reveals rales. Examination of the left-lower field reveals rales. Rales present.  Abdominal:     Palpations: Abdomen is soft.     Tenderness: There is no abdominal tenderness.  Musculoskeletal:     Cervical back: Normal range of motion and neck supple.  Skin:    General: Skin is warm and dry.  Neurological:     Mental Status: Bryan Kirby is alert.  Psychiatric:        Behavior: Behavior normal.     ED Results / Procedures / Treatments   Labs (all labs ordered are listed, but only abnormal results are displayed) Labs Reviewed  CULTURE, BLOOD (ROUTINE X 2)  CULTURE, BLOOD (ROUTINE X 2)  SARS CORONAVIRUS 2 BY RT PCR (HOSPITAL ORDER, [place] [facility])  LACTIC ACID, PLASMA  LACTIC ACID, PLASMA  CBC WITH DIFFERENTIAL/PLATELET  COMPREHENSIVE METABOLIC PANEL  D-DIMER, QUANTITATIVE (NOT AT [facility])  PROCALCITONIN  LACTATE DEHYDROGENASE  FERRITIN  TRIGLYCERIDES  FIBRINOGEN  C-REACTIVE PROTEIN    EKG None  Radiology No results found.  Procedures Procedures (including  critical care time)  Medications Ordered in ED Medications  dexamethasone (DECADRON) injection 10 mg (has no administration in time range)  remdesivir 200 mg in sodium chloride 0.9% 250 mL IVPB (has no administration in time range)    Followed by  remdesivir 100 mg in sodium chloride 0.9 % 100 mL IVPB (has no administration in time range)    ED Course  I have reviewed the triage vital signs and the nursing notes.  Pertinent labs & imaging results that were available during my care of the patient were reviewed by me and considered in my medical decision making (see chart for details).    MDM Rules/Calculators/A&P                           CC:sob VS: BP 115/70   Pulse 70   Temp (!) 97.3 F (36.3 C) (Oral)   Resp (!) 27   Ht 5\' 11"  (1.803 m)   Wt 124.7 kg   SpO2 90%   BMI 38.35 kg/m   HK:VQQVZDG is gathered by patient and emr. Previous records obtained and reviewed. DDX:The patient's complaint of sob involves an extensive number of diagnostic and treatment options, and is a complaint that carries with it a high risk of complications, morbidity, and potential mortality. Given the large differential diagnosis, medical decision making is of high complexity. The emergent differential diagnosis for shortness of breath includes, but is not limited to, Pulmonary edema, bronchoconstriction, Pneumonia, Pulmonary embolism, Pneumotherax/ Hemothorax, Dysrythmia, ACS.   Labs: I ordered reviewed and interpreted labs which include CBC without elevated white blood cell count.  Does have a lymphopenia.  D-dimer C-reactive protein ferritin BNP elevated, normal lactic acid.  LDH elevated.  CMP shows sodium of 128 without elevated blood glucose level.  Elevated liver enzymes.  Covid positive. Imaging: I ordered and reviewed images which included 1 v cxr. I independently visualized and interpreted all imaging. Significant findings include multifocal pneumonia . There are no acute, significant findings  on today's images. EKG: Consults: LOV:FIEPPIR with hypoxic resp failure. Patient will be admitted for Covid pneumonia Patient disposition: The patient appears reasonably stabilized for admission considering the current resources, flow, and capabilities available in the ED at this time, and I doubt any other [facility] requiring further screening and/or treatment in the ED prior to admission.     RIGGIN CUTTINO was evaluated in Emergency Department on 06/22/2020 for the symptoms described in the history of present illness. Bryan Kirby was evaluated in the context of the global COVID-19 pandemic, which necessitated consideration that the patient might be at risk for infection with the SARS-CoV-2 virus that causes COVID-19. Institutional protocols and algorithms that pertain to the evaluation of patients at risk for COVID-19 are in a state of rapid change based on information released by regulatory bodies including the CDC and federal and state organizations. These policies and algorithms were followed during the patient's care in the ED.  Final Clinical Impression(s) / ED Diagnoses Final diagnoses:  Acute hypoxemic respiratory failure due to COVID-19 [facility])    Rx / DC Orders ED Discharge Orders    None       Margarita Mail, PA-C 06/22/20 2056    Valarie Merino, MD 06/24/20 1308

## 2020-06-22 NOTE — Telephone Encounter (Signed)
Patient left a voicemail stating that he went to Urgent Care and has pneumonia from covid. Patient stated that he was not given anything for pneumonia and needs help.

## 2020-06-22 NOTE — Progress Notes (Signed)
Pt unable to use CPAP QHS while in the ED due to not being in a negative pressure room.  RT will follow up once pt has bed on unit.

## 2020-06-22 NOTE — ED Triage Notes (Signed)
Dx COVID 06/15/20 c/o SOB general weakness cough. SPO2 86-89 room air while EMS on scene.

## 2020-06-22 NOTE — Telephone Encounter (Signed)
He needs to call 911 if his o2 is at 83%

## 2020-06-22 NOTE — Telephone Encounter (Signed)
Pt left v/m at 11:31 requesting cb about breathing. Regina LPN left v/m for pt and spoke to pt at 11:54. Per chart review pt is at [facility] ED. FYI to Dr Lorelei Pont.

## 2020-06-22 NOTE — Telephone Encounter (Signed)
Spoke to patient by telephone and was advised that when EMS came out this morning for his wife they checked his oxygen level and told him it was fine at 90% while at rest. Patient stated that EMS told him if he went to the ER with his level being 90% they would not keep him. Patient checked his oxygen level while on the phone and it was 84%. Patient stated that his SOB has been bad over the past 2-3 days but not sure if it has gotten worse today. Patient kept the pulse ox on his finger while on the phone and it was between 80-87% while resting in his recliner. Patient was advised that he should go to the ER. Patient stated that his wife is at Urgent Care now because of covid and will either have her take him to the ER or call 911. Patient stated that he will call his wife and find out how long it will be before she gets home.  Advised patient I will call him back shortly to follow-up with him. Advised patient to keep the pulse ox on his finger.

## 2020-06-23 LAB — CBC WITH DIFFERENTIAL/PLATELET
Abs Immature Granulocytes: 0.06 10*3/uL (ref 0.00–0.07)
Basophils Absolute: 0 10*3/uL (ref 0.0–0.1)
Basophils Relative: 0 %
Eosinophils Absolute: 0 10*3/uL (ref 0.0–0.5)
Eosinophils Relative: 0 %
HCT: 37.8 % — ABNORMAL LOW (ref 39.0–52.0)
Hemoglobin: 12.7 g/dL — ABNORMAL LOW (ref 13.0–17.0)
Immature Granulocytes: 1 %
Lymphocytes Relative: 4 %
Lymphs Abs: 0.2 10*3/uL — ABNORMAL LOW (ref 0.7–4.0)
MCH: 29.7 pg (ref 26.0–34.0)
MCHC: 33.6 g/dL (ref 30.0–36.0)
MCV: 88.5 fL (ref 80.0–100.0)
Monocytes Absolute: 0.1 10*3/uL (ref 0.1–1.0)
Monocytes Relative: 2 %
Neutro Abs: 4.7 10*3/uL (ref 1.7–7.7)
Neutrophils Relative %: 93 %
Platelets: 229 10*3/uL (ref 150–400)
RBC: 4.27 MIL/uL (ref 4.22–5.81)
RDW: 13.3 % (ref 11.5–15.5)
WBC: 5.1 10*3/uL (ref 4.0–10.5)
nRBC: 0 % (ref 0.0–0.2)

## 2020-06-23 LAB — GLUCOSE, CAPILLARY
Glucose-Capillary: 159 mg/dL — ABNORMAL HIGH (ref 70–99)
Glucose-Capillary: 192 mg/dL — ABNORMAL HIGH (ref 70–99)
Glucose-Capillary: 220 mg/dL — ABNORMAL HIGH (ref 70–99)
Glucose-Capillary: 186 mg/dL — ABNORMAL HIGH (ref 70–99)

## 2020-06-23 LAB — COMPREHENSIVE METABOLIC PANEL
ALT: 70 U/L — ABNORMAL HIGH (ref 0–44)
AST: 137 U/L — ABNORMAL HIGH (ref 15–41)
Albumin: 2.5 g/dL — ABNORMAL LOW (ref 3.5–5.0)
Alkaline Phosphatase: 37 U/L — ABNORMAL LOW (ref 38–126)
Anion gap: 10 (ref 5–15)
BUN: 13 mg/dL (ref 8–23)
CO2: 22 mmol/L (ref 22–32)
Calcium: 7.4 mg/dL — ABNORMAL LOW (ref 8.9–10.3)
Chloride: 102 mmol/L (ref 98–111)
Creatinine, Ser: 0.84 mg/dL (ref 0.61–1.24)
GFR calc Af Amer: >60 mL/min (ref >60)
GFR calc non Af Amer: >60 mL/min (ref >60)
Glucose, Bld: 157 mg/dL — ABNORMAL HIGH (ref 70–99)
Potassium: 3.7 mmol/L (ref 3.5–5.1)
Sodium: 134 mmol/L — ABNORMAL LOW (ref 135–145)
Total Bilirubin: 0.6 mg/dL (ref 0.3–1.2)
Total Protein: 5.6 g/dL — ABNORMAL LOW (ref 6.5–8.1)

## 2020-06-23 LAB — HEMOGLOBIN A1C
Hgb A1c MFr Bld: 6.5 % — ABNORMAL HIGH (ref 4.8–5.6)
Mean Plasma Glucose: 139.85 mg/dL

## 2020-06-23 LAB — FERRITIN: Ferritin: 360 ng/mL — ABNORMAL HIGH (ref 24–336)

## 2020-06-23 LAB — PHOSPHORUS: Phosphorus: 2.9 mg/dL (ref 2.5–4.6)

## 2020-06-23 LAB — HIV ANTIBODY (ROUTINE TESTING W REFLEX): HIV Screen 4th Generation wRfx: NONREACTIVE

## 2020-06-23 LAB — D-DIMER, QUANTITATIVE: D-Dimer, Quant: 2 ug/mL-FEU — ABNORMAL HIGH (ref 0.00–0.50)

## 2020-06-23 LAB — C-REACTIVE PROTEIN: CRP: 15.9 mg/dL — ABNORMAL HIGH (ref <1.0)

## 2020-06-23 LAB — MAGNESIUM: Magnesium: 2.1 mg/dL (ref 1.7–2.4)

## 2020-06-23 MED ORDER — METHYLPREDNISOLONE SODIUM SUCC 125 MG IJ SOLR
60.0000 mg | Freq: Two times a day (BID) | INTRAMUSCULAR | Status: AC
  Administered 2020-06-24 – 2020-06-29 (×12): 60 mg via INTRAVENOUS
  Filled 2020-06-23 (×13): qty 2

## 2020-06-23 MED ORDER — IBUPROFEN 200 MG PO TABS
400.0000 mg | ORAL_TABLET | ORAL | Status: DC | PRN
  Administered 2020-06-23 – 2020-06-24 (×2): 400 mg via ORAL
  Filled 2020-06-23 (×3): qty 2

## 2020-06-23 MED ORDER — FUROSEMIDE 10 MG/ML IJ SOLN
20.0000 mg | Freq: Two times a day (BID) | INTRAMUSCULAR | Status: AC
  Administered 2020-06-23 – 2020-06-24 (×2): 20 mg via INTRAVENOUS
  Filled 2020-06-23 (×2): qty 2

## 2020-06-23 MED ORDER — ENSURE ENLIVE PO LIQD
237.0000 mL | Freq: Two times a day (BID) | ORAL | Status: DC
  Administered 2020-06-23 – 2020-07-01 (×10): 237 mL via ORAL

## 2020-06-23 MED ORDER — PREDNISONE 20 MG PO TABS
50.0000 mg | ORAL_TABLET | Freq: Every day | ORAL | Status: DC
  Administered 2020-06-30 – 2020-07-01 (×2): 50 mg via ORAL
  Filled 2020-06-23 (×2): qty 2

## 2020-06-23 MED ORDER — ENOXAPARIN SODIUM 60 MG/0.6ML ~~LOC~~ SOLN
60.0000 mg | SUBCUTANEOUS | Status: DC
  Administered 2020-06-23 – 2020-06-30 (×8): 60 mg via SUBCUTANEOUS
  Filled 2020-06-23 (×8): qty 0.6

## 2020-06-23 NOTE — Progress Notes (Signed)
PT Cancellation Note  Patient Details Name: Bryan Kirby MRN: [id number] DOB: 18-Mar-1956   Cancelled Treatment:    Reason Eval/Treat Not Completed: Fatigue/lethargy limiting ability to participate Pt reports feeling better but declines "getting up" right now.  Will check back as schedule permits.   [name],[name] 06/23/2020, 2:45 PM [name], DPT Acute Rehabilitation Services Pager: [phone] Office: [phone]

## 2020-06-23 NOTE — Progress Notes (Signed)
PROGRESS NOTE  Bryan Kirby IRW:[id number] DOB: 09-May-1956 DOA: 06/22/2020 PCP: Owens Loffler, MD  HPI/Recap of past 24 hours:  HPI: Bryan Kirby is a 64 y.o. male with medical history significant for obesity, OSA on CPAP, chronic anxiety/depression, HLD, GERD, recent diagnosis of covid-19 viral infection on 06/15/20 at [facility], had monoclonal antibodies on 06/21/20 and felt worse afterwards who presented with worsening dyspnea of 2 days duration.  Associated with persistent non productive cough, bodys aches, chills, poor appetite and generalized weakness.  Had diarrhea for 4 days prior to his first diagnosis of Covid-19 infection, GI symptoms have now resolved.  Admits to pleuritic chest pain, worse when he coughs.  Denies lower extremities edema or tenderness.  He ambulates freely at baseline.  At home his oxygen saturation was 86% on room air.  Concerned about his hypoxia he decided to come to the ED for further evaluation and management.    ED Course: Hypoxic and requiring 3L to maintain O2 sat >90%.  Elevated inflammatory markers.  Covid 19 screening test positive.  CRP >16, D-dimer >2.8.  CXR consistent with Covid-19 viral PNA.  06/23/20: Seen and examined.  Reports his breathing is improved this morning, dyspnea on exertion.  Coughing less.  Inflammatory markers are downtrending.  Denies any chest pain.   Assessment/Plan: Active Problems:   Pneumonia due to COVID-19 virus  COVID-19 viral pneumonia Initial COVID-19 screening test done outpatient positive on 06/15/2020 as reported by the patient COVID-19 screening test done at [facility] ED positive on 06/22/2020 Personally reviewed chest x-ray done on admission which shows bilateral pulmonary infiltrates consistent with COVID-19 viral pneumonia. Received monoclonal antibodies on 06/21/2020 Continue COVID-19 directed therapies, IV remdesivir x5 days, baricitinib x14 days, IV Solu-Medrol twice daily, bronchodilators, albuterol  inhaler every 6 hours, Atrovent inhaler every 6 hours, pulmonary toilet. Continue vitamin C, D3, and Zinc To note, patient gave consent to use Remdesivir and Baricitinib. Continue incentive spirometer and mobilize as tolerated Pronate or side sleeping. Inflammatory markers are downtrending. Monitor inflammatory markers daily.  Acute hypoxic respiratory failure secondary to COVID-19 viral pneumonia Not on oxygen supplementation at baseline Currently requiring HFNC 10 L to maintain O2 saturation above 90% Add IV Lasix 20 mg twice daily x2 doses. Continue to monitor and maintain O2 saturation >92%  Improving hypervolemic hyponatremia Presented with serum sodium 128>134 Gentle diuresing with IV Lasix 20 mg twice daily x2 doses, to maintain net negative fluid  OSA CPAP at night  Chronic anxiety/depression Resume home regimen  Obesity BMI 38 Recommend weight loss outpatient with regular physical activity and healthy dieting.  Hyperlipidemia Hold home statin due to elevated AST.  Generalized weakness, likely 2/2 to covid 19 viral infection At baseline, he ambulates freely with out assistance PT to assess Ambulate with assist and fall precaution Out of bed to chair with every shift        DVT prophylaxis: Subcu Lovenox daily  Code Status: Full code, as stated by the patient himself.   Family Communication: None at bedside  Disposition Plan: Telemetry unit  Consults called: None   Status is: Inpatient   Dispo:  Patient From: Home  Planned Disposition: Home  Expected discharge date: 06/24/20  Medically stable for discharge: No         Objective: Vitals:   06/23/20 0700 06/23/20 0802 06/23/20 1116 06/23/20 1159  BP: (!) 144/90 127/69  129/74  Pulse: 69 73  67  Resp: (!) 22 18 17 18   Temp:  97.9 F (  36.6 C)  98 F (36.7 C)  TempSrc:      SpO2: 93% (!) 86%  93%  Weight:      Height:        Intake/Output Summary (Last 24 hours)  at 06/23/2020 1553 Last data filed at 06/23/2020 0900 Gross per 24 hour  Intake 240 ml  Output 300 ml  Net -60 ml   Filed Weights   06/22/20 1313  Weight: 124.7 kg    Exam:  . General: 64 y.o. year-old male well developed well nourished in no acute distress.  Alert and oriented x3. . Cardiovascular: Regular rate and rhythm with no rubs or gallops.  No thyromegaly or JVD noted.   Marland Kitchen Respiratory: Mild rales, improved, bilaterally.  Poor inspiratory effort. . Abdomen: Soft nontender nondistended with normal bowel sounds x4 quadrants. . Musculoskeletal: Trace lower extremity edema bilaterally. Marland Kitchen Psychiatry: Mood is appropriate for condition and setting   Data Reviewed: CBC: Recent Labs  Lab 06/19/20 1256 06/22/20 1532 06/23/20 0455  WBC 4.9 7.9 5.1  NEUTROABS 4.3 7.0 4.7  HGB 14.6 13.5 12.7*  HCT 44.1 40.7 37.8*  MCV 89.1 89.1 88.5  PLT 168 211 962   Basic Metabolic Panel: Recent Labs  Lab 06/19/20 1256 06/22/20 1430 06/23/20 0455  NA 128* 128* 134*  K 4.3 4.6 3.7  CL 92* 95* 102  CO2 25 24 22   GLUCOSE 117* 93 157*  BUN 7* 11 13  CREATININE 0.98 0.91 0.84  CALCIUM 8.3* 8.1* 7.4*  MG  --   --  2.1  PHOS  --   --  2.9   GFR: Estimated Creatinine Clearance: 124.2 mL/min (by C-G formula based on SCr of 0.84 mg/dL). Liver Function Tests: Recent Labs  Lab 06/19/20 1256 06/22/20 1430 06/23/20 0455  AST 83* 167* 137*  ALT 37 73* 70*  ALKPHOS 39 40 37*  BILITOT 0.6 1.0 0.6  PROT 6.8 6.4* 5.6*  ALBUMIN 3.3* 2.9* 2.5*   No results for input(s): LIPASE, AMYLASE in the last 168 hours. No results for input(s): AMMONIA in the last 168 hours. Coagulation Profile: No results for input(s): INR, PROTIME in the last 168 hours. Cardiac Enzymes: No results for input(s): CKTOTAL, CKMB, CKMBINDEX, TROPONINI in the last 168 hours. BNP (last 3 results) No results for input(s): PROBNP in the last 8760 hours. HbA1C: Recent Labs    06/22/20 2130  HGBA1C 6.5*    CBG: Recent Labs  Lab 06/22/20 2227 06/23/20 0757 06/23/20 1103  GLUCAP 131* 159* 192*   Lipid Profile: Recent Labs    06/22/20 1430  TRIG 54   Thyroid Function Tests: No results for input(s): TSH, T4TOTAL, FREET4, T3FREE, THYROIDAB in the last 72 hours. Anemia Panel: Recent Labs    06/22/20 1430 06/23/20 0455  FERRITIN 438* 360*   Urine analysis:    Component Value Date/Time   COLORURINE yellow 07/07/2008 1154   APPEARANCEUR Clear 07/07/2008 1154   LABSPEC <1.005 07/07/2008 1154   PHURINE 6.0 07/07/2008 1154   HGBUR negative 07/07/2008 1154   BILIRUBINUR negative 07/07/2008 1154   UROBILINOGEN 0.2 07/07/2008 1154   NITRITE negative 07/07/2008 1154   Sepsis Labs: @LABRCNTIP (procalcitonin:4,lacticidven:4)  ) Recent Results (from the past 240 hour(s))  SARS Coronavirus 2 by RT PCR (hospital order, performed in [place] hospital lab) Nasopharyngeal Nasopharyngeal Swab     Status: Abnormal   Collection Time: 06/22/20  2:15 PM   Specimen: Nasopharyngeal Swab  Result Value Ref Range Status   SARS Coronavirus 2 POSITIVE (  A) NEGATIVE Final    Comment: RESULT CALLED TO, READ BACK BY AND VERIFIED WITH: S.WEST,RN [id number] @1650  BY V.WILKINS (NOTE) SARS-CoV-2 target nucleic acids are DETECTED  SARS-CoV-2 RNA is generally detectable in upper respiratory specimens  during the acute phase of infection.  Positive results are indicative  of the presence of the identified virus, but do not rule out bacterial infection or co-infection with other pathogens not detected by the test.  Clinical correlation with patient history and  other diagnostic information is necessary to determine patient infection status.  The expected result is negative.  Fact Sheet for Patients:   StrictlyIdeas.no   Fact Sheet for Healthcare Providers:   BankingDealers.co.za    This test is not yet approved or cleared by the [place] FDA and   has been authorized for detection and/or diagnosis of SARS-CoV-2 by FDA under an Emergency Use Authorization (EUA).  This EUA will remain in effect (meaning this t est can be used) for the duration of  the COVID-19 declaration under Section 564(b)(1) of the Act, 21 U.S.C. section 360-bbb-3(b)(1), unless the authorization is terminated or revoked sooner.  Performed at [facility], [place] [address], [place], [place] [address]   Blood Culture (routine x 2)     Status: None (Preliminary result)   Collection Time: 06/22/20  3:42 PM   Specimen: BLOOD LEFT FOREARM  Result Value Ref Range Status   Specimen Description   Final    BLOOD LEFT FOREARM Performed at [facility] [address], [place], [place] [address]    Special Requests   Final    BOTTLES DRAWN AEROBIC AND ANAEROBIC Blood Culture results may not be optimal due to an inadequate volume of blood received in culture bottles Performed at [facility] [address] [place], [place] [address]    Culture   Final    NO GROWTH < 24 HOURS Performed at [facility] [address] [place], [place] [address]    Report Status PENDING  Incomplete  Blood Culture (routine x 2)     Status: None (Preliminary result)   Collection Time: 06/22/20  4:20 PM   Specimen: BLOOD  Result Value Ref Range Status   Specimen Description   Final    BLOOD SITE NOT SPECIFIED Performed at [facility] [address] [place], [place] [address]    Special Requests   Final    BOTTLES DRAWN AEROBIC AND ANAEROBIC Blood Culture results may not be optimal due to an inadequate volume of blood received in culture bottles Performed at [facility] [address] [place], [place] [address]    Culture   Final    NO GROWTH < 24 HOURS Performed at [facility] [address], [place], [place] [address]    Report Status PENDING  Incomplete      Studies: No  results found.  Scheduled Meds: . albuterol  2 puff Inhalation Q6H  . vitamin C  500 mg Oral Daily  . aspirin EC  81 mg Oral Daily  . baricitinib  4 mg Oral Daily  . cholecalciferol  1,000 Units Oral Daily  . enoxaparin (LOVENOX) injection  60 mg Subcutaneous Q24H  . escitalopram  20 mg Oral Daily  . feeding supplement (ENSURE ENLIVE)  237 mL Oral BID BM  . folic acid  1 mg Oral Daily  . insulin aspart  0-5 Units Subcutaneous QHS  . insulin aspart  0-9 Units Subcutaneous TID WC  . ipratropium  2 puff Inhalation Q6H  . methylPREDNISolone (SOLU-MEDROL) injection  1 mg/kg Intravenous Q12H   Followed by  . [START ON 06/25/2020] predniSONE  50 mg Oral QAC breakfast  . multivitamin with minerals  1 tablet Oral Daily  . pantoprazole  40 mg Oral Daily  . senna-docusate  1 tablet Oral QHS  . zinc sulfate  220 mg Oral Daily    Continuous Infusions: . remdesivir 100 mg in NS 100 mL 100 mg (06/23/20 1022)     LOS: 1 day     Kayleen Memos, MD [facility] Pager [phone]  If 7PM-7AM, please contact night-coverage [email or web address] Password [facility] 06/23/2020, 3:53 PM

## 2020-06-23 NOTE — Progress Notes (Signed)
Initial Nutrition Assessment  DOCUMENTATION CODES:   Obesity unspecified  INTERVENTION:   -Ensure Enlive po BID, each supplement provides 350 kcal and 20 grams of protein -Magic cup BID with meals, each supplement provides 290 kcal and 9 grams of protein  NUTRITION DIAGNOSIS:   Increased nutrient needs related to acute illness (COVID-19 infection) as evidenced by estimated needs.  GOAL:   Patient will meet greater than or equal to 90% of their needs  MONITOR:   PO intake, Supplement acceptance, Labs, Weight trends, I & O's  REASON FOR ASSESSMENT:   Malnutrition Screening Tool    ASSESSMENT:   64 y.o. male with medical history significant for obesity, OSA on CPAP, chronic anxiety/depression, HLD, GERD, recent diagnosis of covid-19 viral infection on 06/15/20 at [facility], had monoclonal antibodies on 06/21/20 and felt worse afterwards who presented with worsening dyspnea of 2 days duration.  Associated with persistent non productive cough, bodys aches, chills, poor appetite and generalized weakness.  Patient has been positive for COVID-19 since 9/13. Pt has poor appetite and reports having diarrhea for 4 days prior to positive test. Pt consumed 50% of breakfast this morning. Will order Ensure supplements and Magic cups given increased needs from active COVID-19 infection and pneumonia.  Per weight records, pt has lost 10 lbs since 4/5 ( 3% wt loss x 5.5 months, insignificant for time frame).  Medications: Vitamin C, Vitamin D, Folic acid, Multivitamin with minerals daily, Senokot, Zinc sulfate  Labs reviewed: CBGs: 159-192 Low Na  NUTRITION - FOCUSED PHYSICAL EXAM:  Unable to complete  Diet Order:   Diet Order            Diet Heart Room service appropriate? Yes; Fluid consistency: Thin  Diet effective now                 EDUCATION NEEDS:   No education needs have been identified at this time  Skin:  Skin Assessment: Reviewed RN Assessment  Last BM:   PTA  Height:   Ht Readings from Last 1 Encounters:  06/22/20 5\' 11"  (1.803 m)    Weight:   Wt Readings from Last 1 Encounters:  06/22/20 124.7 kg    BMI:  Body mass index is 38.35 kg/m.  Estimated Nutritional Needs:   Kcal:  2400-2600  Protein:  110-125g  Fluid:  >2L/day  Clayton Bibles, MS, RD, LDN Inpatient Clinical Dietitian Contact information available via Amion

## 2020-06-23 NOTE — ED Notes (Signed)
Repot given to Catie, RN on 5W.

## 2020-06-23 NOTE — Progress Notes (Signed)
Pt. s/u with and placed on CPAP per order, added 10 lpm oxygen into circuit per current HFNC([place]) setting, 94% oxygen saturations on oximeter in room, pt. tolerating well, made aware to notify if needed, RN made aware.

## 2020-06-24 ENCOUNTER — Other Ambulatory Visit: Payer: Self-pay

## 2020-06-24 LAB — CBC WITH DIFFERENTIAL/PLATELET
Abs Immature Granulocytes: 0.08 10*3/uL — ABNORMAL HIGH (ref 0.00–0.07)
Basophils Absolute: 0 10*3/uL (ref 0.0–0.1)
Basophils Relative: 0 %
Eosinophils Absolute: 0 10*3/uL (ref 0.0–0.5)
Eosinophils Relative: 0 %
HCT: 43.5 % (ref 39.0–52.0)
Hemoglobin: 14.2 g/dL (ref 13.0–17.0)
Immature Granulocytes: 1 %
Lymphocytes Relative: 4 %
Lymphs Abs: 0.4 10*3/uL — ABNORMAL LOW (ref 0.7–4.0)
MCH: 29.7 pg (ref 26.0–34.0)
MCHC: 32.6 g/dL (ref 30.0–36.0)
MCV: 91 fL (ref 80.0–100.0)
Monocytes Absolute: 0.5 10*3/uL (ref 0.1–1.0)
Monocytes Relative: 6 %
Neutro Abs: 8.5 10*3/uL — ABNORMAL HIGH (ref 1.7–7.7)
Neutrophils Relative %: 89 %
Platelets: 335 10*3/uL (ref 150–400)
RBC: 4.78 MIL/uL (ref 4.22–5.81)
RDW: 13 % (ref 11.5–15.5)
WBC: 9.5 10*3/uL (ref 4.0–10.5)
nRBC: 0 % (ref 0.0–0.2)

## 2020-06-24 LAB — COMPREHENSIVE METABOLIC PANEL
ALT: 83 U/L — ABNORMAL HIGH (ref 0–44)
AST: 120 U/L — ABNORMAL HIGH (ref 15–41)
Albumin: 2.9 g/dL — ABNORMAL LOW (ref 3.5–5.0)
Alkaline Phosphatase: 44 U/L (ref 38–126)
Anion gap: 12 (ref 5–15)
BUN: 23 mg/dL (ref 8–23)
CO2: 24 mmol/L (ref 22–32)
Calcium: 8.4 mg/dL — ABNORMAL LOW (ref 8.9–10.3)
Chloride: 94 mmol/L — ABNORMAL LOW (ref 98–111)
Creatinine, Ser: 0.95 mg/dL (ref 0.61–1.24)
GFR calc Af Amer: >60 mL/min (ref >60)
GFR calc non Af Amer: >60 mL/min (ref >60)
Glucose, Bld: 185 mg/dL — ABNORMAL HIGH (ref 70–99)
Potassium: 3.7 mmol/L (ref 3.5–5.1)
Sodium: 130 mmol/L — ABNORMAL LOW (ref 135–145)
Total Bilirubin: 0.8 mg/dL (ref 0.3–1.2)
Total Protein: 6.5 g/dL (ref 6.5–8.1)

## 2020-06-24 LAB — PHOSPHORUS: Phosphorus: 3.5 mg/dL (ref 2.5–4.6)

## 2020-06-24 LAB — GLUCOSE, CAPILLARY
Glucose-Capillary: 177 mg/dL — ABNORMAL HIGH (ref 70–99)
Glucose-Capillary: 232 mg/dL — ABNORMAL HIGH (ref 70–99)
Glucose-Capillary: 189 mg/dL — ABNORMAL HIGH (ref 70–99)
Glucose-Capillary: 154 mg/dL — ABNORMAL HIGH (ref 70–99)

## 2020-06-24 LAB — C-REACTIVE PROTEIN: CRP: 8.1 mg/dL — ABNORMAL HIGH (ref <1.0)

## 2020-06-24 LAB — MAGNESIUM: Magnesium: 2.5 mg/dL — ABNORMAL HIGH (ref 1.7–2.4)

## 2020-06-24 LAB — D-DIMER, QUANTITATIVE: D-Dimer, Quant: 1.74 ug/mL-FEU — ABNORMAL HIGH (ref 0.00–0.50)

## 2020-06-24 LAB — FERRITIN: Ferritin: 460 ng/mL — ABNORMAL HIGH (ref 24–336)

## 2020-06-24 MED ORDER — PHENOL 1.4 % MT LIQD
1.0000 | OROMUCOSAL | Status: DC | PRN
  Filled 2020-06-24: qty 177

## 2020-06-24 MED ORDER — BENZOCAINE 20 % MT AERO
INHALATION_SPRAY | Freq: Four times a day (QID) | OROMUCOSAL | Status: DC | PRN
  Filled 2020-06-24 (×2): qty 57

## 2020-06-24 MED ORDER — ACETAMINOPHEN 500 MG PO TABS
1000.0000 mg | ORAL_TABLET | Freq: Four times a day (QID) | ORAL | Status: DC | PRN
  Administered 2020-06-24 – 2020-06-30 (×9): 1000 mg via ORAL
  Filled 2020-06-24 (×9): qty 2

## 2020-06-24 NOTE — Evaluation (Signed)
Physical Therapy Evaluation Patient Details Name: Bryan Kirby MRN: [id number] DOB: July 20, 1956 Today's Date: 06/24/2020   History of Present Illness  64 y.o. male with medical history significant for obesity, OSA on CPAP, chronic anxiety/depression, HLD, GERD, recent diagnosis of covid-19 viral infection on 06/15/20 at [facility], had monoclonal antibodies on 06/21/20 and felt worse afterwards who presented with worsening dyspnea of 2 days duration.  Associated with persistent non productive cough, bodys aches, chills, poor appetite and generalized weakness.  Clinical Impression  Pt admitted with above diagnosis. Stand pivot transfer from bed to recliner, SaO2 83% on 10L HFNC with activity, did not ambulate as pt felt "shaky" in standing. Instructed pt in seated LE strengthening exercises and in pursed lip breathing and use of incentive spirometer.  Pt currently with functional limitations due to the deficits listed below (see PT Problem List). Pt will benefit from skilled PT to increase their independence and safety with mobility to allow discharge to the venue listed below.       Follow Up Recommendations Home health PT    Equipment Recommendations  Other (comment) (4 wheeled RW with seat)    Recommendations for Other Services       Precautions / Restrictions Precautions Precautions: Other (comment);Fall Precaution Comments: monitor O2 Restrictions Weight Bearing Restrictions: No      Mobility  Bed Mobility Overal bed mobility: Modified Independent             General bed mobility comments: HOB up, used rail  Transfers Overall transfer level: Needs assistance Equipment used: None Transfers: Sit to/from [facility] Sit to Stand: Min guard Stand pivot transfers: Min guard       General transfer comment: pt reported feeling "shaky" in standing, performed stand pivot transfer from bed to recliner; SaO2 83% on 10 L O2 HFNC with  activity  Ambulation/Gait                Stairs            Wheelchair Mobility    Modified Rankin (Stroke Patients Only)       Balance Overall balance assessment: Needs assistance   Sitting balance-Leahy Scale: Good       Standing balance-Leahy Scale: Fair                               Pertinent Vitals/Pain Pain Assessment: Faces Faces Pain Scale: Hurts even more Pain Location: throat and chest with coughing, pt wouldn't rate pain Pain Descriptors / Indicators: Grimacing;Guarding Pain Intervention(s): Limited activity within patient's tolerance;Monitored during session;Patient requesting pain meds-RN notified    Home Living                        Prior Function                 Hand Dominance        Extremity/Trunk Assessment   Upper Extremity Assessment Upper Extremity Assessment: Overall WFL for tasks assessed    Lower Extremity Assessment Lower Extremity Assessment: Overall WFL for tasks assessed    Cervical / Trunk Assessment Cervical / Trunk Assessment: Normal  Communication      Cognition Arousal/Alertness: Awake/alert Behavior During Therapy: WFL for tasks assessed/performed Overall Cognitive Status: Within Functional Limits for tasks assessed  General Comments      Exercises Other Exercises Other Exercises: instructed pt in use of incentive spirometer  Seated LAQs x 10 B AROM  Seated marching x 5 B AROM   Assessment/Plan    PT Assessment Patient needs continued PT services  PT Problem List Decreased balance;Decreased activity tolerance;Cardiopulmonary status limiting activity;Decreased mobility       PT Treatment Interventions Gait training;DME instruction;Functional mobility training;Therapeutic exercise;Therapeutic activities;Patient/family education    PT Goals (Current goals can be found in the Care Plan section)  Acute Rehab PT  Goals Patient Stated Goal: 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, community, go to church PT Goal Formulation: With patient Time For Goal Achievement: 07/08/20 Potential to Achieve Goals: Good    Frequency Min 3X/week   Barriers to discharge        Co-evaluation               AM-PAC PT "6 Clicks" Mobility  Outcome Measure Help needed turning from your back to your side while in a flat bed without using bedrails?: A Little Help needed moving from lying on your back to sitting on the side of a flat bed without using bedrails?: A Little Help needed moving to and from a bed to a chair (including a wheelchair)?: A Little Help needed standing up from a chair using your arms (e.g., wheelchair or bedside chair)?: A Little Help needed to walk in hospital room?: A Lot Help needed climbing 3-5 steps with a railing? : A Lot 6 Click Score: 16    End of Session Equipment Utilized During Treatment: Gait belt Activity Tolerance: Patient limited by fatigue Patient left: in chair;with call bell/phone within reach;with nursing/sitter in room Nurse Communication: Mobility status PT Visit Diagnosis: Difficulty in walking, not elsewhere classified (R26.2)    Time: 4287-6811 PT Time Calculation (min) (ACUTE ONLY): 34 min   Charges:   PT Evaluation $PT Eval Low Complexity: 1 Low PT Treatments $Gait Training: 8-22 mins       Blondell Reveal Kistler PT 06/24/2020  Acute Rehabilitation Services Pager [phone] Office [phone]

## 2020-06-24 NOTE — Progress Notes (Addendum)
PROGRESS NOTE    Bryan Kirby  YHC:[id number] DOB: 1956/07/10 DOA: 06/22/2020 PCP: Owens Loffler, MD     Brief Narrative:  Bryan Kirby is a 64 y.o.malewith medical history significant forobesity, OSAon CPAP, chronic anxiety/depression, HLD, GERD, recent diagnosis of Covid-19 viral infection on 06/15/20 at [facility], had monoclonal antibodies on 9/19/21and felt worse afterwardswho presented with worsening dyspnea of 2 days duration. Associated with persistent non productive cough,bodys aches, chills, poor appetite and generalized weakness.Had diarrhea for 4 days prior to his first diagnosis of Covid-19 infection, GI symptoms have now resolved. Admits to pleuritic chest pain, worse when he coughs. Denies lower extremities edema or tenderness. At home hisoxygen saturation was 86% on room air. Concerned about his hypoxia he decided to come to the ED for further evaluation and management.   New events last 24 hours / Subjective: States that his appetite has now come back.  Has some sores in his mouth that are painful.  Denies chest pain, worsening shortness of breath, nausea, vomiting  Assessment & Plan:   Active Problems:   Pneumonia due to COVID-19 virus   Acute hypoxemic respiratory failure secondary to COVID-19 -Remains on high flow nasal cannula O2, wean as able  COVID-19 -Tested positive 9/13 and again 9/20 -Received monoclonal antibodies 9/19 -Continue Remdesivir, baricitinib, IV Solu-Medrol  COVID-19 Labs Recent Labs    06/22/20 1430 06/22/20 1532 06/23/20 0455 06/24/20 0415  DDIMER  --  2.80* 2.00* 1.74*  FERRITIN 438*  --  360* 460*  LDH 491*  --   --   --   CRP 16.4*  --  15.9* 8.1*   Weakness -PT recommends home health  Obesity -Estimated body mass index is 38.35 kg/m as calculated from the following:   Height as of this encounter: 5\' 11"  (1.803 m).   Weight as of this encounter: 124.7 kg.  OSA -CPAP  nightly  Depression/anxiety -Continue Lexapro   DVT prophylaxis: Lovenox  Code Status: Partial code, DNI Family Communication: No family at bedside Disposition Plan:  Status is: Inpatient  Remains inpatient appropriate because:Inpatient level of care appropriate due to severity of illness   Dispo:  Patient From: Home  Planned Disposition: Home  Expected discharge date: 06/26/20  Medically stable for discharge: No remains on high flow nasal cannula O2 this morning    Consultants:   None  Procedures:   None  Antimicrobials:  Anti-infectives (From admission, onward)   Start     Dose/Rate Route Frequency Ordered Stop   06/23/20 1000  remdesivir 100 mg in sodium chloride 0.9 % 100 mL IVPB       "Followed by" Linked Group Details   100 mg 200 mL/hr over 30 Minutes Intravenous Daily 06/22/20 1353 06/27/20 0959   06/22/20 1530  remdesivir 200 mg in sodium chloride 0.9% 250 mL IVPB       "Followed by" Linked Group Details   200 mg 580 mL/hr over 30 Minutes Intravenous Once 06/22/20 1353 06/22/20 2138        Objective: Vitals:   06/24/20 0300 06/24/20 0359 06/24/20 1409 06/24/20 1412  BP:  (!) 123/99  130/80  Pulse:  74  62  Resp:  20  18  Temp:  98.1 F (36.7 C)  97.7 F (36.5 C)  TempSrc:    Oral  SpO2: 95% 96% (!) 83% 94%  Weight:      Height:        Intake/Output Summary (Last 24 hours) at 06/24/2020 1432 Last data filed  at 06/24/2020 1132 Gross per 24 hour  Intake 480.55 ml  Output 2500 ml  Net -2019.45 ml   Filed Weights   06/22/20 1313  Weight: 124.7 kg    Examination:  General exam: Appears calm and comfortable  Respiratory system: Respiratory effort normal. No respiratory distress. No conversational dyspnea.  Cardiovascular system: No pedal edema. Central nervous system: Alert and oriented. No focal neurological deficits. Speech clear.  Extremities: Symmetric in appearance  Skin: No rashes, lesions or ulcers on exposed skin  Psychiatry:  Judgement and insight appear normal. Mood & affect appropriate.   Data Reviewed: I have personally reviewed following labs and imaging studies  CBC: Recent Labs  Lab 06/19/20 1256 06/22/20 1532 06/23/20 0455 06/24/20 0415  WBC 4.9 7.9 5.1 9.5  NEUTROABS 4.3 7.0 4.7 8.5*  HGB 14.6 13.5 12.7* 14.2  HCT 44.1 40.7 37.8* 43.5  MCV 89.1 89.1 88.5 91.0  PLT 168 211 229 607   Basic Metabolic Panel: Recent Labs  Lab 06/19/20 1256 06/22/20 1430 06/23/20 0455 06/24/20 0415  NA 128* 128* 134* 130*  K 4.3 4.6 3.7 3.7  CL 92* 95* 102 94*  CO2 25 24 22 24   GLUCOSE 117* 93 157* 185*  BUN 7* 11 13 23   CREATININE 0.98 0.91 0.84 0.95  CALCIUM 8.3* 8.1* 7.4* 8.4*  MG  --   --  2.1 2.5*  PHOS  --   --  2.9 3.5   GFR: Estimated Creatinine Clearance: 109.8 mL/min (by C-G formula based on SCr of 0.95 mg/dL). Liver Function Tests: Recent Labs  Lab 06/19/20 1256 06/22/20 1430 06/23/20 0455 06/24/20 0415  AST 83* 167* 137* 120*  ALT 37 73* 70* 83*  ALKPHOS 39 40 37* 44  BILITOT 0.6 1.0 0.6 0.8  PROT 6.8 6.4* 5.6* 6.5  ALBUMIN 3.3* 2.9* 2.5* 2.9*   No results for input(s): LIPASE, AMYLASE in the last 168 hours. No results for input(s): AMMONIA in the last 168 hours. Coagulation Profile: No results for input(s): INR, PROTIME in the last 168 hours. Cardiac Enzymes: No results for input(s): CKTOTAL, CKMB, CKMBINDEX, TROPONINI in the last 168 hours. BNP (last 3 results) No results for input(s): PROBNP in the last 8760 hours. HbA1C: Recent Labs    06/22/20 2130  HGBA1C 6.5*   CBG: Recent Labs  Lab 06/23/20 1103 06/23/20 1558 06/23/20 2006 06/24/20 0732 06/24/20 1118  GLUCAP 192* 220* 186* 177* 232*   Lipid Profile: Recent Labs    06/22/20 1430  TRIG 54   Thyroid Function Tests: No results for input(s): TSH, T4TOTAL, FREET4, T3FREE, THYROIDAB in the last 72 hours. Anemia Panel: Recent Labs    06/23/20 0455 06/24/20 0415  FERRITIN 360* 460*   Sepsis  Labs: Recent Labs  Lab 06/22/20 1430 06/22/20 1547  PROCALCITON 0.20  --   LATICACIDVEN 1.7 1.7    Recent Results (from the past 240 hour(s))  SARS Coronavirus 2 by RT PCR (hospital order, performed in [facility] hospital lab) Nasopharyngeal Nasopharyngeal Swab     Status: Abnormal   Collection Time: 06/22/20  2:15 PM   Specimen: Nasopharyngeal Swab  Result Value Ref Range Status   SARS Coronavirus 2 POSITIVE (A) NEGATIVE Final    Comment: RESULT CALLED TO, READ BACK BY AND VERIFIED WITH: S.WEST,RN [id number] @1650  BY V.WILKINS (NOTE) SARS-CoV-2 target nucleic acids are DETECTED  SARS-CoV-2 RNA is generally detectable in upper respiratory specimens  during the acute phase of infection.  Positive results are indicative  of the presence of  the identified virus, but do not rule out bacterial infection or co-infection with other pathogens not detected by the test.  Clinical correlation with patient history and  other diagnostic information is necessary to determine patient infection status.  The expected result is negative.  Fact Sheet for Patients:   StrictlyIdeas.no   Fact Sheet for Healthcare Providers:   BankingDealers.co.za    This test is not yet approved or cleared by the [place] FDA and  has been authorized for detection and/or diagnosis of SARS-CoV-2 by FDA under an Emergency Use Authorization (EUA).  This EUA will remain in effect (meaning this t est can be used) for the duration of  the COVID-19 declaration under Section 564(b)(1) of the Act, 21 U.S.C. section 360-bbb-3(b)(1), unless the authorization is terminated or revoked sooner.  Performed at [facility], [place] [address], [place], [place] [address]   Blood Culture (routine x 2)     Status: None (Preliminary result)   Collection Time: 06/22/20  3:42 PM   Specimen: BLOOD LEFT FOREARM  Result Value Ref Range Status   Specimen Description    Final    BLOOD LEFT FOREARM Performed at [facility] [address], [place], [place] [address]    Special Requests   Final    BOTTLES DRAWN AEROBIC AND ANAEROBIC Blood Culture results may not be optimal due to an inadequate volume of blood received in culture bottles Performed at [facility] [address] [place], [place] [address]    Culture   Final    NO GROWTH 2 DAYS Performed at [facility] [address], [place], [place] [address]    Report Status PENDING  Incomplete  Blood Culture (routine x 2)     Status: None (Preliminary result)   Collection Time: 06/22/20  4:20 PM   Specimen: BLOOD  Result Value Ref Range Status   Specimen Description   Final    BLOOD SITE NOT SPECIFIED Performed at [facility] [address], [place], [place] [address]    Special Requests   Final    BOTTLES DRAWN AEROBIC AND ANAEROBIC Blood Culture results may not be optimal due to an inadequate volume of blood received in culture bottles Performed at [facility] [address] [place], [place] [address]    Culture   Final    NO GROWTH 2 DAYS Performed at [facility] [address], [place],  [address]    Report Status PENDING  Incomplete      Radiology Studies: DG Chest Port 1 View  Result Date: 06/22/2020 CLINICAL DATA:  Cough, shortness of breath, and weakness. Recent COVID-19 diagnosis a week ago. EXAM: PORTABLE CHEST 1 VIEW COMPARISON:  CTA chest dated September 30, 2014. Chest x-ray dated September 29, 2014. FINDINGS: The heart size and mediastinal contours are within normal limits. Normal pulmonary vascularity. Patchy interstitial and airspace opacities in the right mid lung and both lung bases. No pleural effusion or pneumothorax. No acute osseous abnormality. IMPRESSION: 1. Bilateral patchy interstitial and airspace disease consistent with multifocal COVID-19 pneumonia. Electronically Signed   By:  Titus Dubin M.D.   On: 06/22/2020 14:43      Scheduled Meds: . albuterol  2 puff Inhalation Q6H  . vitamin C  500 mg Oral Daily  . aspirin EC  81 mg Oral Daily  . baricitinib  4 mg Oral Daily  . cholecalciferol  1,000 Units Oral Daily  .  enoxaparin (LOVENOX) injection  60 mg Subcutaneous Q24H  . escitalopram  20 mg Oral Daily  . feeding supplement (ENSURE ENLIVE)  237 mL Oral BID BM  . folic acid  1 mg Oral Daily  . insulin aspart  0-5 Units Subcutaneous QHS  . insulin aspart  0-9 Units Subcutaneous TID WC  . ipratropium  2 puff Inhalation Q6H  . methylPREDNISolone (SOLU-MEDROL) injection  60 mg Intravenous Q12H   Followed by  . [START ON 06/30/2020] predniSONE  50 mg Oral QAC breakfast  . multivitamin with minerals  1 tablet Oral Daily  . pantoprazole  40 mg Oral Daily  . senna-docusate  1 tablet Oral QHS  . zinc sulfate  220 mg Oral Daily   Continuous Infusions: . remdesivir 100 mg in NS 100 mL 100 mg (06/24/20 1013)     LOS: 2 days      Time spent: 45 minutes   Dessa Phi, DO [facility] 06/24/2020, 2:32 PM   Available via Epic secure chat 7am-7pm After these hours, please refer to coverage provider listed on [email or web address]

## 2020-06-25 LAB — GLUCOSE, CAPILLARY
Glucose-Capillary: 150 mg/dL — ABNORMAL HIGH (ref 70–99)
Glucose-Capillary: 196 mg/dL — ABNORMAL HIGH (ref 70–99)
Glucose-Capillary: 220 mg/dL — ABNORMAL HIGH (ref 70–99)
Glucose-Capillary: 207 mg/dL — ABNORMAL HIGH (ref 70–99)

## 2020-06-25 LAB — CBC WITH DIFFERENTIAL/PLATELET
Abs Immature Granulocytes: 0.14 10*3/uL — ABNORMAL HIGH (ref 0.00–0.07)
Basophils Absolute: 0 10*3/uL (ref 0.0–0.1)
Basophils Relative: 0 %
Eosinophils Absolute: 0 10*3/uL (ref 0.0–0.5)
Eosinophils Relative: 0 %
HCT: 42.5 % (ref 39.0–52.0)
Hemoglobin: 13.9 g/dL (ref 13.0–17.0)
Immature Granulocytes: 1 %
Lymphocytes Relative: 4 %
Lymphs Abs: 0.5 10*3/uL — ABNORMAL LOW (ref 0.7–4.0)
MCH: 29.6 pg (ref 26.0–34.0)
MCHC: 32.7 g/dL (ref 30.0–36.0)
MCV: 90.4 fL (ref 80.0–100.0)
Monocytes Absolute: 0.9 10*3/uL (ref 0.1–1.0)
Monocytes Relative: 8 %
Neutro Abs: 9.7 10*3/uL — ABNORMAL HIGH (ref 1.7–7.7)
Neutrophils Relative %: 87 %
Platelets: 370 10*3/uL (ref 150–400)
RBC: 4.7 MIL/uL (ref 4.22–5.81)
RDW: 13 % (ref 11.5–15.5)
WBC: 11.3 10*3/uL — ABNORMAL HIGH (ref 4.0–10.5)
nRBC: 0 % (ref 0.0–0.2)

## 2020-06-25 LAB — D-DIMER, QUANTITATIVE: D-Dimer, Quant: 1.29 ug/mL-FEU — ABNORMAL HIGH (ref 0.00–0.50)

## 2020-06-25 LAB — COMPREHENSIVE METABOLIC PANEL
ALT: 87 U/L — ABNORMAL HIGH (ref 0–44)
AST: 101 U/L — ABNORMAL HIGH (ref 15–41)
Albumin: 2.8 g/dL — ABNORMAL LOW (ref 3.5–5.0)
Alkaline Phosphatase: 44 U/L (ref 38–126)
Anion gap: 12 (ref 5–15)
BUN: 26 mg/dL — ABNORMAL HIGH (ref 8–23)
CO2: 24 mmol/L (ref 22–32)
Calcium: 8.4 mg/dL — ABNORMAL LOW (ref 8.9–10.3)
Chloride: 96 mmol/L — ABNORMAL LOW (ref 98–111)
Creatinine, Ser: 0.83 mg/dL (ref 0.61–1.24)
GFR calc Af Amer: >60 mL/min (ref >60)
GFR calc non Af Amer: >60 mL/min (ref >60)
Glucose, Bld: 174 mg/dL — ABNORMAL HIGH (ref 70–99)
Potassium: 4.5 mmol/L (ref 3.5–5.1)
Sodium: 132 mmol/L — ABNORMAL LOW (ref 135–145)
Total Bilirubin: 0.8 mg/dL (ref 0.3–1.2)
Total Protein: 6 g/dL — ABNORMAL LOW (ref 6.5–8.1)

## 2020-06-25 LAB — C-REACTIVE PROTEIN: CRP: 2.5 mg/dL — ABNORMAL HIGH (ref <1.0)

## 2020-06-25 LAB — MAGNESIUM: Magnesium: 2.5 mg/dL — ABNORMAL HIGH (ref 1.7–2.4)

## 2020-06-25 LAB — PHOSPHORUS: Phosphorus: 3.7 mg/dL (ref 2.5–4.6)

## 2020-06-25 LAB — FERRITIN: Ferritin: 320 ng/mL (ref 24–336)

## 2020-06-25 MED ORDER — SALINE SPRAY 0.65 % NA SOLN
1.0000 | NASAL | Status: DC | PRN
  Filled 2020-06-25: qty 44

## 2020-06-25 NOTE — Progress Notes (Signed)
Physical Therapy Treatment Patient Details Name: Bryan Kirby MRN: [id number] DOB: [birth year] Today's Date: 06/25/2020    History of Present Illness 64 y.o. male with medical history significant for obesity, OSA on CPAP, chronic anxiety/depression, HLD, GERD, recent diagnosis of covid-19 viral infection on 06/15/20 at [facility], had monoclonal antibodies on 06/21/20 and felt worse afterwards who presented with worsening dyspnea of 2 days duration.  Associated with persistent non productive cough, bodys aches, chills, poor appetite and generalized weakness.    PT Comments    Pt in bed on 10 HFNC at rest 92% and HR 74.  Assisted with amb.  General Gait Details: pt was able to amb several laps around room (he didn't want to try hallway yet....maybe next time) remained on 10 HFNC sats range 77 - 88 with activity with 3/4 dyspnea.  avg HR 96.  Pt required several standing rest breaks with total upright stance time approx 6 min before "I got  to sit down". Also use RW for support which pt will not need once felling better. LPT did rec a Rollator if pt agrees.    Follow Up Recommendations  Home health PT     Equipment Recommendations  Other (comment) [phone] Rollator with seat if pt agrees)    Recommendations for Other Services       Precautions / Restrictions Precautions Precaution Comments: monitor O2 Restrictions Weight Bearing Restrictions: No    Mobility  Bed Mobility Overal bed mobility: Modified Independent                Transfers Overall transfer level: Modified independent               General transfer comment: self able with use of hands to steady self  Ambulation/Gait Ambulation/Gait assistance: Modified independent (Device/Increase time) Gait Distance (Feet): 22 Feet Assistive device: Rolling walker (2 wheeled) Gait Pattern/deviations: Step-through pattern Gait velocity: decreased   General Gait Details: pt was able to amb several laps around room  (he didn't want to try hallway yet....maybe next time) remained on 10 HFNC sats range 77 - 88 with activity with 3/4 dyspnea.  avg HR 96.  Pt required several standing rest breaks with total upright stance time approx 6 min before "I got  to sit down". Also use RW for support which pt will not need once felling better.   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        Pertinent Vitals/Pain Pain Assessment: Faces Faces Pain Scale: Hurts a little bit Pain Location: throat and chest with coughing, Pain Descriptors / Indicators: Grimacing;Guarding Pain Intervention(s): Monitored during session    Home Living  Prior Function            PT Goals (current goals can now be found in the care plan section) Progress towards PT goals: Progressing toward goals    Frequency    Min 3X/week      PT Plan Current plan remains appropriate    Co-evaluation              AM-PAC PT "6 Clicks" Mobility   Outcome Measure  Help needed turning from your back to your side while in a flat bed without using bedrails?: A Little Help needed moving from lying on your back to sitting on the side of a flat bed without using bedrails?: A Little Help needed moving to and from a bed to a chair (including a wheelchair)?: A Little Help needed standing up from a chair using your arms (e.g., wheelchair or bedside chair)?: A Little Help needed to walk in hospital room?: A Little Help needed climbing 3-5 steps with a railing? : A  Little 6 Click Score: 18    End of Session Equipment Utilized During Treatment: Gait belt Activity Tolerance: Patient limited by fatigue Patient left: in chair;with call bell/phone within reach;with nursing/sitter in room Nurse Communication: Mobility status PT Visit Diagnosis: Difficulty in walking, not elsewhere classified (R26.2)     Time: 9509-3267 PT Time Calculation (min) (ACUTE ONLY): 25 min  Charges:  $Gait Training: 8-22 mins $Therapeutic Activity: 8-22 mins                     Rica Koyanagi  PTA Acute  Rehabilitation Services Pager      [phone] Office      [phone]

## 2020-06-25 NOTE — Progress Notes (Signed)
PROGRESS NOTE    Bryan Kirby  KWI:[id number] DOB: 07/02/1956 DOA: 06/22/2020 PCP: Owens Loffler, MD     Brief Narrative:  Bryan Kirby is a 64 y.o.malewith medical history significant forobesity, OSAon CPAP, chronic anxiety/depression, HLD, GERD, recent diagnosis of Covid-19 viral infection on 06/15/20 at [facility], had monoclonal antibodies on 9/19/21and felt worse afterwardswho presented with worsening dyspnea of 2 days duration. Associated with persistent non productive cough,bodys aches, chills, poor appetite and generalized weakness.Had diarrhea for 4 days prior to his first diagnosis of Covid-19 infection, GI symptoms have now resolved. Admits to pleuritic chest pain, worse when he coughs. Denies lower extremities edema or tenderness. At home hisoxygen saturation was 86% on room air. Concerned about his hypoxia he decided to come to the ED for further evaluation and management.   New events last 24 hours / Subjective: Sores in his mouth feel better with benzocaine spray, still requiring high levels of oxygen  Assessment & Plan:   Active Problems:   Pneumonia due to COVID-19 virus   Acute hypoxemic respiratory failure secondary to COVID-19 -Remains on 11 L high flow nasal cannula O2, wean as able  COVID-19 -Tested positive 9/13 and again 9/20 -Received monoclonal antibodies 9/19 -Continue Remdesivir, baricitinib, IV Solu-Medrol  COVID-19 Labs Recent Labs    06/22/20 1430 06/22/20 1532 06/23/20 0455 06/24/20 0415 06/25/20 0426  DDIMER  --    < > 2.00* 1.74* 1.29*  FERRITIN 438*   < > 360* 460* 320  LDH 491*  --   --   --   --   CRP 16.4*   < > 15.9* 8.1* 2.5*   < > = values in this interval not displayed.   Weakness -PT recommends home health  Obesity -Estimated body mass index is 38.35 kg/m as calculated from the following:   Height as of this encounter: 5\' 11"  (1.803 m).   Weight as of this encounter: 124.7 kg.  OSA -CPAP  nightly  Depression/anxiety -Continue Lexapro   DVT prophylaxis: Lovenox  Code Status: Partial code, DNI Family Communication: No family at bedside, wife is admitted to the hospital as well with Covid Disposition Plan:  Status is: Inpatient  Remains inpatient appropriate because:Inpatient level of care appropriate due to severity of illness   Dispo:  Patient From: Home  Planned Disposition: Home  Expected discharge date: 06/27/20  Medically stable for discharge: No remains on high flow nasal cannula O2 this morning    Consultants:   None  Procedures:   None  Antimicrobials:  Anti-infectives (From admission, onward)   Start     Dose/Rate Route Frequency Ordered Stop   06/23/20 1000  remdesivir 100 mg in sodium chloride 0.9 % 100 mL IVPB       "Followed by" Linked Group Details   100 mg 200 mL/hr over 30 Minutes Intravenous Daily 06/22/20 1353 06/27/20 0959   06/22/20 1530  remdesivir 200 mg in sodium chloride 0.9% 250 mL IVPB       "Followed by" Linked Group Details   200 mg 580 mL/hr over 30 Minutes Intravenous Once 06/22/20 1353 06/22/20 2138       Objective: Vitals:   06/24/20 2115 06/24/20 2225 06/24/20 2252 06/25/20 0612  BP: 118/62   119/76  Pulse: 69   61  Resp: 20  20 20   Temp: 97.9 F (36.6 C)   98 F (36.7 C)  TempSrc: Axillary     SpO2: (!) 84% 96%  100%  Weight:  Height:        Intake/Output Summary (Last 24 hours) at 06/25/2020 1216 Last data filed at 06/25/2020 0845 Gross per 24 hour  Intake 360 ml  Output 1150 ml  Net -790 ml   Filed Weights   06/22/20 1313  Weight: 124.7 kg    Examination: General exam: Appears calm and comfortable  Respiratory system: Crackles, without distress, no conversational dyspnea, nasal cannula O2 Cardiovascular system: S1 & S2 heard, RRR. No pedal edema. Gastrointestinal system: Abdomen is nondistended, soft and nontender. Normal bowel sounds heard. Central nervous system: Alert and oriented.  Non focal exam. Speech clear  Extremities: Symmetric in appearance bilaterally  Skin: No rashes, lesions or ulcers on exposed skin  Psychiatry: Judgement and insight appear stable. Mood & affect appropriate.    Data Reviewed: I have personally reviewed following labs and imaging studies  CBC: Recent Labs  Lab 06/19/20 1256 06/22/20 1532 06/23/20 0455 06/24/20 0415 06/25/20 0426  WBC 4.9 7.9 5.1 9.5 11.3*  NEUTROABS 4.3 7.0 4.7 8.5* 9.7*  HGB 14.6 13.5 12.7* 14.2 13.9  HCT 44.1 40.7 37.8* 43.5 42.5  MCV 89.1 89.1 88.5 91.0 90.4  PLT 168 211 229 335 416   Basic Metabolic Panel: Recent Labs  Lab 06/19/20 1256 06/22/20 1430 06/23/20 0455 06/24/20 0415 06/25/20 0426  NA 128* 128* 134* 130* 132*  K 4.3 4.6 3.7 3.7 4.5  CL 92* 95* 102 94* 96*  CO2 25 24 22 24 24   GLUCOSE 117* 93 157* 185* 174*  BUN 7* 11 13 23  26*  CREATININE 0.98 0.91 0.84 0.95 0.83  CALCIUM 8.3* 8.1* 7.4* 8.4* 8.4*  MG  --   --  2.1 2.5* 2.5*  PHOS  --   --  2.9 3.5 3.7   GFR: Estimated Creatinine Clearance: 125.7 mL/min (by C-G formula based on SCr of 0.83 mg/dL). Liver Function Tests: Recent Labs  Lab 06/19/20 1256 06/22/20 1430 06/23/20 0455 06/24/20 0415 06/25/20 0426  AST 83* 167* 137* 120* 101*  ALT 37 73* 70* 83* 87*  ALKPHOS 39 40 37* 44 44  BILITOT 0.6 1.0 0.6 0.8 0.8  PROT 6.8 6.4* 5.6* 6.5 6.0*  ALBUMIN 3.3* 2.9* 2.5* 2.9* 2.8*   No results for input(s): LIPASE, AMYLASE in the last 168 hours. No results for input(s): AMMONIA in the last 168 hours. Coagulation Profile: No results for input(s): INR, PROTIME in the last 168 hours. Cardiac Enzymes: No results for input(s): CKTOTAL, CKMB, CKMBINDEX, TROPONINI in the last 168 hours. BNP (last 3 results) No results for input(s): PROBNP in the last 8760 hours. HbA1C: Recent Labs    06/22/20 2130  HGBA1C 6.5*   CBG: Recent Labs  Lab 06/24/20 1118 06/24/20 1559 06/24/20 2053 06/25/20 0757 06/25/20 1133  GLUCAP 232* 189*  154* 150* 196*   Lipid Profile: Recent Labs    06/22/20 1430  TRIG 54   Thyroid Function Tests: No results for input(s): TSH, T4TOTAL, FREET4, T3FREE, THYROIDAB in the last 72 hours. Anemia Panel: Recent Labs    06/24/20 0415 06/25/20 0426  FERRITIN 460* 320   Sepsis Labs: Recent Labs  Lab 06/22/20 1430 06/22/20 1547  PROCALCITON 0.20  --   LATICACIDVEN 1.7 1.7    Recent Results (from the past 240 hour(s))  SARS Coronavirus 2 by RT PCR (hospital order, performed in [facility] hospital lab) Nasopharyngeal Nasopharyngeal Swab     Status: Abnormal   Collection Time: 06/22/20  2:15 PM   Specimen: Nasopharyngeal Swab  Result Value Ref  Range Status   SARS Coronavirus 2 POSITIVE (A) NEGATIVE Final    Comment: RESULT CALLED TO, READ BACK BY AND VERIFIED WITH: S.WEST,RN [id number] @1650  BY V.WILKINS (NOTE) SARS-CoV-2 target nucleic acids are DETECTED  SARS-CoV-2 RNA is generally detectable in upper respiratory specimens  during the acute phase of infection.  Positive results are indicative  of the presence of the identified virus, but do not rule out bacterial infection or co-infection with other pathogens not detected by the test.  Clinical correlation with patient history and  other diagnostic information is necessary to determine patient infection status.  The expected result is negative.  Fact Sheet for Patients:   StrictlyIdeas.no   Fact Sheet for Healthcare Providers:   BankingDealers.co.za    This test is not yet approved or cleared by the [place] FDA and  has been authorized for detection and/or diagnosis of SARS-CoV-2 by FDA under an Emergency Use Authorization (EUA).  This EUA will remain in effect (meaning this t est can be used) for the duration of  the COVID-19 declaration under Section 564(b)(1) of the Act, 21 U.S.C. section 360-bbb-3(b)(1), unless the authorization is terminated or revoked  sooner.  Performed at [facility], [place] [address] [place], [place] [address]   Blood Culture (routine x 2)     Status: None (Preliminary result)   Collection Time: 06/22/20  3:42 PM   Specimen: BLOOD LEFT FOREARM  Result Value Ref Range Status   Specimen Description   Final    BLOOD LEFT FOREARM Performed at [place] [address], [place], [place] [address]    Special Requests   Final    BOTTLES DRAWN AEROBIC AND ANAEROBIC Blood Culture results may not be optimal due to an inadequate volume of blood received in culture bottles Performed at [facility] [address] [place], [place] [address]    Culture   Final    NO GROWTH 3 DAYS Performed at [facility], [place] [address] [place], [place] [address]    Report Status PENDING  Incomplete  Blood Culture (routine x 2)     Status: None (Preliminary result)   Collection Time: 06/22/20  4:20 PM   Specimen: BLOOD  Result Value Ref Range Status   Specimen Description   Final    BLOOD SITE NOT SPECIFIED Performed at [facility] [address] [place], [place] [address]    Special Requests   Final    BOTTLES DRAWN AEROBIC AND ANAEROBIC Blood Culture results may not be optimal due to an inadequate volume of blood received in culture bottles Performed at [place] [address] [place], [place] [address]    Culture   Final    NO GROWTH 3 DAYS Performed at [facility], [place] [address], [place],  [address]    Report Status PENDING  Incomplete      Radiology Studies: No results found.    Scheduled Meds: . albuterol  2 puff Inhalation Q6H  . vitamin C  500 mg Oral Daily  . aspirin EC  81 mg Oral Daily  . baricitinib  4 mg Oral Daily  . cholecalciferol  1,000 Units Oral Daily  . enoxaparin (LOVENOX) injection  60 mg Subcutaneous Q24H  . escitalopram  20 mg Oral Daily  . feeding supplement (ENSURE ENLIVE)  237  mL Oral BID BM  . folic acid  1 mg Oral Daily  . insulin aspart  0-5 Units Subcutaneous QHS  . insulin aspart  0-9 Units Subcutaneous TID WC  . ipratropium  2 puff Inhalation Q6H  . methylPREDNISolone (SOLU-MEDROL) injection  60 mg Intravenous Q12H   Followed by  . [START ON 06/30/2020] predniSONE  50 mg Oral QAC breakfast  . multivitamin with minerals  1 tablet Oral Daily  . pantoprazole  40 mg Oral Daily  . senna-docusate  1 tablet Oral QHS  . zinc sulfate  220 mg Oral Daily   Continuous Infusions: . remdesivir 100 mg in NS 100 mL 100 mg (06/25/20 0954)     LOS: 3 days      Time spent: 40 minutes   Dessa Phi, DO [facility] 06/25/2020, 12:16 PM   Available via Epic secure chat 7am-7pm After these hours, please refer to coverage provider listed on [email or web address]

## 2020-06-26 LAB — GLUCOSE, CAPILLARY
Glucose-Capillary: 159 mg/dL — ABNORMAL HIGH (ref 70–99)
Glucose-Capillary: 206 mg/dL — ABNORMAL HIGH (ref 70–99)
Glucose-Capillary: 177 mg/dL — ABNORMAL HIGH (ref 70–99)
Glucose-Capillary: 172 mg/dL — ABNORMAL HIGH (ref 70–99)

## 2020-06-26 LAB — CBC WITH DIFFERENTIAL/PLATELET
Abs Immature Granulocytes: 0.2 10*3/uL — ABNORMAL HIGH (ref 0.00–0.07)
Basophils Absolute: 0 10*3/uL (ref 0.0–0.1)
Basophils Relative: 0 %
Eosinophils Absolute: 0 10*3/uL (ref 0.0–0.5)
Eosinophils Relative: 0 %
HCT: 40.3 % (ref 39.0–52.0)
Hemoglobin: 13.2 g/dL (ref 13.0–17.0)
Immature Granulocytes: 2 %
Lymphocytes Relative: 4 %
Lymphs Abs: 0.5 10*3/uL — ABNORMAL LOW (ref 0.7–4.0)
MCH: 29.6 pg (ref 26.0–34.0)
MCHC: 32.8 g/dL (ref 30.0–36.0)
MCV: 90.4 fL (ref 80.0–100.0)
Monocytes Absolute: 0.9 10*3/uL (ref 0.1–1.0)
Monocytes Relative: 8 %
Neutro Abs: 9.9 10*3/uL — ABNORMAL HIGH (ref 1.7–7.7)
Neutrophils Relative %: 86 %
Platelets: 390 10*3/uL (ref 150–400)
RBC: 4.46 MIL/uL (ref 4.22–5.81)
RDW: 13 % (ref 11.5–15.5)
WBC: 11.5 10*3/uL — ABNORMAL HIGH (ref 4.0–10.5)
nRBC: 0 % (ref 0.0–0.2)

## 2020-06-26 LAB — COMPREHENSIVE METABOLIC PANEL
ALT: 91 U/L — ABNORMAL HIGH (ref 0–44)
AST: 78 U/L — ABNORMAL HIGH (ref 15–41)
Albumin: 2.7 g/dL — ABNORMAL LOW (ref 3.5–5.0)
Alkaline Phosphatase: 41 U/L (ref 38–126)
Anion gap: 11 (ref 5–15)
BUN: 23 mg/dL (ref 8–23)
CO2: 25 mmol/L (ref 22–32)
Calcium: 8.3 mg/dL — ABNORMAL LOW (ref 8.9–10.3)
Chloride: 97 mmol/L — ABNORMAL LOW (ref 98–111)
Creatinine, Ser: 0.74 mg/dL (ref 0.61–1.24)
GFR calc Af Amer: >60 mL/min (ref >60)
GFR calc non Af Amer: >60 mL/min (ref >60)
Glucose, Bld: 177 mg/dL — ABNORMAL HIGH (ref 70–99)
Potassium: 4.6 mmol/L (ref 3.5–5.1)
Sodium: 133 mmol/L — ABNORMAL LOW (ref 135–145)
Total Bilirubin: 0.6 mg/dL (ref 0.3–1.2)
Total Protein: 5.8 g/dL — ABNORMAL LOW (ref 6.5–8.1)

## 2020-06-26 LAB — C-REACTIVE PROTEIN: CRP: 1 mg/dL — ABNORMAL HIGH (ref <1.0)

## 2020-06-26 LAB — FERRITIN: Ferritin: 236 ng/mL (ref 24–336)

## 2020-06-26 LAB — D-DIMER, QUANTITATIVE: D-Dimer, Quant: 1.05 ug/mL-FEU — ABNORMAL HIGH (ref 0.00–0.50)

## 2020-06-26 NOTE — Progress Notes (Signed)
Physical Therapy Treatment Patient Details Name: Bryan Kirby MRN: [id number] DOB: [birth year] Today's Date: 06/26/2020    History of Present Illness 64 y.o. male with medical history significant for obesity, OSA on CPAP, chronic anxiety/depression, HLD, GERD, recent diagnosis of covid-19 viral infection on 06/15/20 at [facility], had monoclonal antibodies on 06/21/20 and felt worse afterwards who presented with worsening dyspnea of 2 days duration.  Associated with persistent non productive cough, bodys aches, chills, poor appetite and generalized weakness.    PT Comments    Assisted OOB to amb in hallway for his first time outside room with recliner following for safety.  General Gait Details: pt tolerated an increased distance but still required 10 HFNC to achieve therapeutic sats.  Range from 84 - 91% with HR 84.  Dyspnea 2/4.  Used a walker for safety and to increase gait distance however pt most likely will not need once D/C'd.  TBD Had pt sit and rest in hallway near elevator as another PT was amb pt's spouse to do the same so both husband and wife could visit each other for a short bit.   Follow Up Recommendations  Home health PT     Equipment Recommendations  Other (comment) ($WW Rollator if pt agrees closer to D/C date)    Recommendations for Other Services       Precautions / Restrictions Precautions Precaution Comments: monitor O2    Mobility  Bed Mobility Overal bed mobility: Modified Independent                Transfers Overall transfer level: Modified independent   Transfers: Sit to/from Stand;Stand Pivot Transfers Sit to Stand: Supervision         General transfer comment: self able with use of hands to steady self  Ambulation/Gait Ambulation/Gait assistance: Supervision Gait Distance (Feet): 75 Feet Assistive device: Rolling walker (2 wheeled) Gait Pattern/deviations: Step-through pattern Gait velocity: decreased   General Gait Details:  pt tolerated an increased distance but still required 10 HFNC to achieve therapeutic sats.  Range from 84 - 91% with HR 84.  Dyspnea 2/4.  Used a walker for safety and to increase gait distance however pt most likely will not need once D/C'd.  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  Spouse is also here with COVID in room 1539.      Exercises      General Comments        Pertinent Vitals/Pain Pain Assessment: No/denies pain    Home Living                      Prior Function            PT Goals (current goals can  now be found in the care plan section) Progress towards PT goals: Progressing toward goals    Frequency    Min 3X/week      PT Plan Current plan remains appropriate    Co-evaluation              AM-PAC PT "6 Clicks" Mobility   Outcome Measure  Help needed turning from your back to your side while in a flat bed without using bedrails?: None Help needed moving from lying on your back to sitting on the side of a flat bed without using bedrails?: None Help needed moving to and from a bed to a chair (including a wheelchair)?: None Help needed standing up from a chair using your arms (e.g., wheelchair or bedside chair)?: None Help needed to walk in hospital room?: A Little Help needed climbing 3-5 steps with a railing? : A Little 6 Click Score: 22    End of Session Equipment Utilized During Treatment: Gait belt Activity Tolerance: Patient limited by fatigue Patient left: in chair;with call bell/phone within reach;with nursing/sitter in  room Nurse Communication: Mobility status PT Visit Diagnosis: Difficulty in walking, not elsewhere classified (R26.2)     Time: 6438-3818 PT Time Calculation (min) (ACUTE ONLY): 14 min  Charges:  $Gait Training: 8-22 mins                     {Aaden Buckman  PTA Acute  Rehabilitation [employer]      [phone] Office      [phone]

## 2020-06-26 NOTE — Progress Notes (Signed)
Physical Therapy Treatment Patient Details Name: Bryan Kirby MRN: [id number] DOB: 12-Oct-1955 Today's Date: 06/26/2020    History of Present Illness 64 y.o. male with medical history significant for obesity, OSA on CPAP, chronic anxiety/depression, HLD, GERD, recent diagnosis of covid-19 viral infection on 06/15/20 at [facility], had monoclonal antibodies on 06/21/20 and felt worse afterwards who presented with worsening dyspnea of 2 days duration.  Associated with persistent non productive cough, bodys aches, chills, poor appetite and generalized weakness.    PT Comments    General Gait Details: assisted with amb back to room from hallway visit with spouse.  "It was good to see her".  Assisted to bathroom for a quick void and wash up.  Assisted out of bathroom. Pt requested to go back to bed.  "that wore me out".  Follow Up Recommendations  Home health PT     Equipment Recommendations  Other (comment) [phone] Rollator if pt agrees closer to D/C date)    Recommendations for Other Services       Precautions / Restrictions Precautions Precaution Comments: monitor O2    Mobility  Bed Mobility Overal bed mobility: Modified Independent                Transfers Overall transfer level: Modified independent   Transfers: Sit to/from Stand;Stand Pivot Transfers Sit to Stand: Supervision         General transfer comment: self able with use of hands to steady self  Ambulation/Gait Ambulation/Gait assistance: Supervision Gait Distance (Feet): 75 Feet Assistive device: Rolling walker (2 wheeled) Gait Pattern/deviations: Step-through pattern Gait velocity: decreased   General Gait Details: assisted with amb back to room from hallway visit with spouse.  "It was good to see her".  Pt requested to go back to bed.  "that wore me out".   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  Spouse is also here with COVID in room 1539.      Exercises      General Comments        Pertinent Vitals/Pain Pain Assessment: No/denies pain    Home Living                      Prior Function            PT Goals (current goals can now be found in the care plan section) Progress towards PT goals: Progressing toward goals    Frequency    Min 3X/week      PT Plan Current plan remains appropriate    Co-evaluation              AM-PAC PT "6 Clicks" Mobility   Outcome Measure  Help needed turning from your back to your side  while in a flat bed without using bedrails?: None Help needed moving from lying on your back to sitting on the side of a flat bed without using bedrails?: None Help needed moving to and from a bed to a chair (including a wheelchair)?: None Help needed standing up from a chair using your arms (e.g., wheelchair or bedside chair)?: None Help needed to walk in hospital room?: A Little Help needed climbing 3-5 steps with a railing? : A Little 6 Click Score: 22    End of Session Equipment Utilized During Treatment: Gait belt Activity Tolerance: Patient limited by fatigue Patient left: in chair;with call bell/phone within reach;with nursing/sitter in room Nurse Communication: Mobility status PT Visit Diagnosis: Difficulty in walking, not elsewhere classified (R26.2)     Time: 7824-2353 PT Time Calculation (min) (ACUTE ONLY): 25 min  Charges:  $Gait Training: 8-22 mins $Therapeutic Activity: 8-22 mins                     Rica Koyanagi  PTA Acute  Rehabilitation Services Pager      [phone] Office       [phone]

## 2020-06-26 NOTE — Progress Notes (Signed)
PROGRESS NOTE    Bryan Kirby  YHC:[id number] DOB: Feb 23, 1956 DOA: 06/22/2020 PCP: Owens Loffler, MD     Brief Narrative:  Bryan Kirby is a 64 y.o.malewith medical history significant forobesity, OSAon CPAP, chronic anxiety/depression, HLD, GERD, recent diagnosis of Covid-19 viral infection on 06/15/20 at [facility], had monoclonal antibodies on 9/19/21and felt worse afterwardswho presented with worsening dyspnea of 2 days duration. Associated with persistent non productive cough,bodys aches, chills, poor appetite and generalized weakness.Had diarrhea for 4 days prior to his first diagnosis of Covid-19 infection, GI symptoms have now resolved. Admits to pleuritic chest pain, worse when he coughs. Denies lower extremities edema or tenderness. At home hisoxygen saturation was 86% on room air. Concerned about his hypoxia he decided to come to the ED for further evaluation and management.   New events last 24 hours / Subjective: He states that his breathing has improved.  He was on 10 L high flow nasal cannula O2 and I subsequently weaned him to 9 L.  He had no conversational dyspnea during my examination.  Assessment & Plan:   Active Problems:   Pneumonia due to COVID-19 virus   Acute hypoxemic respiratory failure secondary to COVID-19 -Remains on 9L high flow nasal cannula O2, wean as able  COVID-19 -Tested positive 9/13 and again 9/20 -Received monoclonal antibodies 9/19 -Continue Remdesivir, baricitinib, IV Solu-Medrol  COVID-19 Labs Recent Labs    06/24/20 0415 06/25/20 0426 06/26/20 0408  DDIMER 1.74* 1.29* 1.05*  FERRITIN 460* 320 236  CRP 8.1* 2.5* 1.0*   Weakness -PT recommends home health  Obesity -Estimated body mass index is 38.35 kg/m as calculated from the following:   Height as of this encounter: 5\' 11"  (1.803 m).   Weight as of this encounter: 124.7 kg.  OSA -CPAP nightly  Depression/anxiety -Continue Lexapro   DVT  prophylaxis: Lovenox  Code Status: Partial code, DNI Family Communication: No family at bedside, wife is admitted to the hospital as well with Covid  Disposition Plan:  Status is: Inpatient  Remains inpatient appropriate because:Inpatient level of care appropriate due to severity of illness   Dispo:  Patient From: Home  Planned Disposition: Home  Expected discharge date: 2 days   Medically stable for discharge: No remains on high flow nasal cannula O2 this morning    Consultants:   None  Procedures:   None  Antimicrobials:  Anti-infectives (From admission, onward)   Start     Dose/Rate Route Frequency Ordered Stop   06/23/20 1000  remdesivir 100 mg in sodium chloride 0.9 % 100 mL IVPB       "Followed by" Linked Group Details   100 mg 200 mL/hr over 30 Minutes Intravenous Daily 06/22/20 1353 06/26/20 1011   06/22/20 1530  remdesivir 200 mg in sodium chloride 0.9% 250 mL IVPB       "Followed by" Linked Group Details   200 mg 580 mL/hr over 30 Minutes Intravenous Once 06/22/20 1353 06/22/20 2138       Objective: Vitals:   06/25/20 1441 06/25/20 1954 06/25/20 2241 06/26/20 0354  BP: 120/62 129/71  (!) 142/88  Pulse: 65 67  64  Resp: 18 20 20 20   Temp: 98.2 F (36.8 C) 98.2 F (36.8 C)  97.6 F (36.4 C)  TempSrc:      SpO2: 90% 95%  94%  Weight:      Height:        Intake/Output Summary (Last 24 hours) at 06/26/2020 1132 Last data filed at  06/25/2020 2104 Gross per 24 hour  Intake 720 ml  Output 1225 ml  Net -505 ml   Filed Weights   06/22/20 1313  Weight: 124.7 kg    Examination: General exam: Appears calm and comfortable  Respiratory system: Clear to auscultation. Respiratory effort normal.  Without conversational dyspnea on 9 L Cardiovascular system: S1 & S2 heard, RRR. No pedal edema. Gastrointestinal system: Abdomen is nondistended, soft and nontender. Normal bowel sounds heard. Central nervous system: Alert and oriented. Non focal exam. Speech  clear  Extremities: Symmetric in appearance bilaterally  Skin: No rashes, lesions or ulcers on exposed skin  Psychiatry: Judgement and insight appear stable. Mood & affect appropriate.    Data Reviewed: I have personally reviewed following labs and imaging studies  CBC: Recent Labs  Lab 06/22/20 1532 06/23/20 0455 06/24/20 0415 06/25/20 0426 06/26/20 0408  WBC 7.9 5.1 9.5 11.3* 11.5*  NEUTROABS 7.0 4.7 8.5* 9.7* 9.9*  HGB 13.5 12.7* 14.2 13.9 13.2  HCT 40.7 37.8* 43.5 42.5 40.3  MCV 89.1 88.5 91.0 90.4 90.4  PLT 211 229 335 370 174   Basic Metabolic Panel: Recent Labs  Lab 06/22/20 1430 06/23/20 0455 06/24/20 0415 06/25/20 0426 06/26/20 0408  NA 128* 134* 130* 132* 133*  K 4.6 3.7 3.7 4.5 4.6  CL 95* 102 94* 96* 97*  CO2 24 22 24 24 25   GLUCOSE 93 157* 185* 174* 177*  BUN 11 13 23  26* 23  CREATININE 0.91 0.84 0.95 0.83 0.74  CALCIUM 8.1* 7.4* 8.4* 8.4* 8.3*  MG  --  2.1 2.5* 2.5*  --   PHOS  --  2.9 3.5 3.7  --    GFR: Estimated Creatinine Clearance: 130.4 mL/min (by C-G formula based on SCr of 0.74 mg/dL). Liver Function Tests: Recent Labs  Lab 06/22/20 1430 06/23/20 0455 06/24/20 0415 06/25/20 0426 06/26/20 0408  AST 167* 137* 120* 101* 78*  ALT 73* 70* 83* 87* 91*  ALKPHOS 40 37* 44 44 41  BILITOT 1.0 0.6 0.8 0.8 0.6  PROT 6.4* 5.6* 6.5 6.0* 5.8*  ALBUMIN 2.9* 2.5* 2.9* 2.8* 2.7*   No results for input(s): LIPASE, AMYLASE in the last 168 hours. No results for input(s): AMMONIA in the last 168 hours. Coagulation Profile: No results for input(s): INR, PROTIME in the last 168 hours. Cardiac Enzymes: No results for input(s): CKTOTAL, CKMB, CKMBINDEX, TROPONINI in the last 168 hours. BNP (last 3 results) No results for input(s): PROBNP in the last 8760 hours. HbA1C: No results for input(s): HGBA1C in the last 72 hours. CBG: Recent Labs  Lab 06/25/20 1133 06/25/20 1606 06/25/20 1951 06/26/20 0753 06/26/20 1101  GLUCAP 196* 220* 207* 159* 206*     Lipid Profile: No results for input(s): CHOL, HDL, LDLCALC, TRIG, CHOLHDL, LDLDIRECT in the last 72 hours. Thyroid Function Tests: No results for input(s): TSH, T4TOTAL, FREET4, T3FREE, THYROIDAB in the last 72 hours. Anemia Panel: Recent Labs    06/25/20 0426 06/26/20 0408  FERRITIN 320 236   Sepsis Labs: Recent Labs  Lab 06/22/20 1430 06/22/20 1547  PROCALCITON 0.20  --   LATICACIDVEN 1.7 1.7    Recent Results (from the past 240 hour(s))  SARS Coronavirus 2 by RT PCR (hospital order, performed in [facility] hospital lab) Nasopharyngeal Nasopharyngeal Swab     Status: Abnormal   Collection Time: 06/22/20  2:15 PM   Specimen: Nasopharyngeal Swab  Result Value Ref Range Status   SARS Coronavirus 2 POSITIVE (A) NEGATIVE Final  Comment: RESULT CALLED TO, READ BACK BY AND VERIFIED WITH: S.WEST,RN [id number] @1650  BY V.WILKINS (NOTE) SARS-CoV-2 target nucleic acids are DETECTED  SARS-CoV-2 RNA is generally detectable in upper respiratory specimens  during the acute phase of infection.  Positive results are indicative  of the presence of the identified virus, but do not rule out bacterial infection or co-infection with other pathogens not detected by the test.  Clinical correlation with patient history and  other diagnostic information is necessary to determine patient infection status.  The expected result is negative.  Fact Sheet for Patients:   StrictlyIdeas.no   Fact Sheet for Healthcare Providers:   BankingDealers.co.za    This test is not yet approved or cleared by the [place] FDA and  has been authorized for detection and/or diagnosis of SARS-CoV-2 by FDA under an Emergency Use Authorization (EUA).  This EUA will remain in effect (meaning this t est can be used) for the duration of  the COVID-19 declaration under Section 564(b)(1) of the Act, 21 U.S.C. section 360-bbb-3(b)(1), unless the authorization  is terminated or revoked sooner.  Performed at [facility], [place] [address] [place], [place] [address]   Blood Culture (routine x 2)     Status: None (Preliminary result)   Collection Time: 06/22/20  3:42 PM   Specimen: BLOOD LEFT FOREARM  Result Value Ref Range Status   Specimen Description   Final    BLOOD LEFT FOREARM Performed at [facility] [address], [place], [place] [address]    Special Requests   Final    BOTTLES DRAWN AEROBIC AND ANAEROBIC Blood Culture results may not be optimal due to an inadequate volume of blood received in culture bottles Performed at [facility] [address], [place], [place] [address]    Culture   Final    NO GROWTH 3 DAYS Performed at [facility], [place] [address] [place], [place] [address]    Report Status PENDING  Incomplete  Blood Culture (routine x 2)     Status: None (Preliminary result)   Collection Time: 06/22/20  4:20 PM   Specimen: BLOOD  Result Value Ref Range Status   Specimen Description   Final    BLOOD SITE NOT SPECIFIED Performed at [facility] [address] [place], [place] [address]    Special Requests   Final    BOTTLES DRAWN AEROBIC AND ANAEROBIC Blood Culture results may not be optimal due to an inadequate volume of blood received in culture bottles Performed at [facility] [address] [place], [place] [address]    Culture   Final    NO GROWTH 3 DAYS Performed at [facility], [place] [address] [place], [place] [address]    Report Status PENDING  Incomplete      Radiology Studies: No results found.    Scheduled Meds: . albuterol  2 puff Inhalation Q6H  . vitamin C  500 mg Oral Daily  . aspirin EC  81 mg Oral Daily  . baricitinib  4 mg Oral Daily  . cholecalciferol  1,000 Units Oral Daily  . enoxaparin (LOVENOX) injection  60 mg Subcutaneous Q24H  . escitalopram  20 mg Oral Daily  . feeding  supplement (ENSURE ENLIVE)  237 mL Oral BID BM  . folic acid  1 mg Oral Daily  . insulin aspart  0-5 Units Subcutaneous QHS  . insulin aspart  0-9 Units Subcutaneous TID WC  .  ipratropium  2 puff Inhalation Q6H  . methylPREDNISolone (SOLU-MEDROL) injection  60 mg Intravenous Q12H   Followed by  . [START ON 06/30/2020] predniSONE  50 mg Oral QAC breakfast  . multivitamin with minerals  1 tablet Oral Daily  . pantoprazole  40 mg Oral Daily  . senna-docusate  1 tablet Oral QHS  . zinc sulfate  220 mg Oral Daily   Continuous Infusions:    LOS: 4 days      Time spent: 40 minutes   Dessa Phi, DO [facility] 06/26/2020, 11:32 AM   Available via Epic secure chat 7am-7pm After these hours, please refer to coverage provider listed on [email or web address]

## 2020-06-27 LAB — COMPREHENSIVE METABOLIC PANEL
ALT: 83 U/L — ABNORMAL HIGH (ref 0–44)
AST: 48 U/L — ABNORMAL HIGH (ref 15–41)
Albumin: 2.6 g/dL — ABNORMAL LOW (ref 3.5–5.0)
Alkaline Phosphatase: 39 U/L (ref 38–126)
Anion gap: 8 (ref 5–15)
BUN: 23 mg/dL (ref 8–23)
CO2: 30 mmol/L (ref 22–32)
Calcium: 8.4 mg/dL — ABNORMAL LOW (ref 8.9–10.3)
Chloride: 95 mmol/L — ABNORMAL LOW (ref 98–111)
Creatinine, Ser: 0.8 mg/dL (ref 0.61–1.24)
GFR calc Af Amer: >60 mL/min (ref >60)
GFR calc non Af Amer: >60 mL/min (ref >60)
Glucose, Bld: 172 mg/dL — ABNORMAL HIGH (ref 70–99)
Potassium: 5 mmol/L (ref 3.5–5.1)
Sodium: 133 mmol/L — ABNORMAL LOW (ref 135–145)
Total Bilirubin: 0.5 mg/dL (ref 0.3–1.2)
Total Protein: 5.7 g/dL — ABNORMAL LOW (ref 6.5–8.1)

## 2020-06-27 LAB — D-DIMER, QUANTITATIVE: D-Dimer, Quant: 1.02 ug/mL-FEU — ABNORMAL HIGH (ref 0.00–0.50)

## 2020-06-27 LAB — CBC WITH DIFFERENTIAL/PLATELET
Abs Immature Granulocytes: 0.34 10*3/uL — ABNORMAL HIGH (ref 0.00–0.07)
Basophils Absolute: 0 10*3/uL (ref 0.0–0.1)
Basophils Relative: 0 %
Eosinophils Absolute: 0 10*3/uL (ref 0.0–0.5)
Eosinophils Relative: 0 %
HCT: 39.7 % (ref 39.0–52.0)
Hemoglobin: 13 g/dL (ref 13.0–17.0)
Immature Granulocytes: 3 %
Lymphocytes Relative: 4 %
Lymphs Abs: 0.4 10*3/uL — ABNORMAL LOW (ref 0.7–4.0)
MCH: 29.6 pg (ref 26.0–34.0)
MCHC: 32.7 g/dL (ref 30.0–36.0)
MCV: 90.4 fL (ref 80.0–100.0)
Monocytes Absolute: 0.9 10*3/uL (ref 0.1–1.0)
Monocytes Relative: 7 %
Neutro Abs: 10.5 10*3/uL — ABNORMAL HIGH (ref 1.7–7.7)
Neutrophils Relative %: 86 %
Platelets: 423 10*3/uL — ABNORMAL HIGH (ref 150–400)
RBC: 4.39 MIL/uL (ref 4.22–5.81)
RDW: 12.9 % (ref 11.5–15.5)
WBC: 12.2 10*3/uL — ABNORMAL HIGH (ref 4.0–10.5)
nRBC: 0 % (ref 0.0–0.2)

## 2020-06-27 LAB — CULTURE, BLOOD (ROUTINE X 2)
Culture: NO GROWTH
Culture: NO GROWTH

## 2020-06-27 LAB — GLUCOSE, CAPILLARY
Glucose-Capillary: 140 mg/dL — ABNORMAL HIGH (ref 70–99)
Glucose-Capillary: 215 mg/dL — ABNORMAL HIGH (ref 70–99)
Glucose-Capillary: 229 mg/dL — ABNORMAL HIGH (ref 70–99)
Glucose-Capillary: 248 mg/dL — ABNORMAL HIGH (ref 70–99)

## 2020-06-27 LAB — FERRITIN: Ferritin: 196 ng/mL (ref 24–336)

## 2020-06-27 LAB — C-REACTIVE PROTEIN: CRP: 0.6 mg/dL (ref <1.0)

## 2020-06-27 NOTE — Progress Notes (Signed)
Physical Therapy Treatment Patient Details Name: Bryan Kirby MRN: [id number] DOB: Jan 16, 1956 Today's Date: 06/27/2020    History of Present Illness 64 y.o. male with medical history significant for obesity, OSA on CPAP, chronic anxiety/depression, HLD, GERD, recent diagnosis of covid-19 viral infection on 06/15/20 at [facility], had monoclonal antibodies on 06/21/20 and felt worse afterwards who presented with worsening dyspnea of 2 days duration.  Associated with persistent non productive cough, bodys aches, chills, poor appetite and generalized weakness.    PT Comments    Pt motivated to participate in OOB mobility. Pt ambulated 30, 15, and 15 ft distances with extended seated rest breaks in between due to fatigue, DOE 3/4, and O2 desaturation to 78% on 8LO2. Pt required x1 period of 10LO2 due to severe dyspnea and pt unable to recover sats with breathing technique alone. Pt up in chair, PT encouraging incentive spirometer and up with RN staff later this evening. PT to continue to follow acutely.      Follow Up Recommendations  Home health PT     Equipment Recommendations  Other (comment) ($WW Rollator if pt agrees closer to D/C date)    Recommendations for Other Services       Precautions / Restrictions Precautions Precaution Comments: monitor O2 - on 7.5L HFNC at rest Restrictions Weight Bearing Restrictions: No    Mobility  Bed Mobility Overal bed mobility: Needs Assistance Bed Mobility: Supine to Sit     Supine to sit: Min assist;HOB elevated     General bed mobility comments: min assist for trunk elevation off bed via HHA, HOB elevated to 40*.  Transfers Overall transfer level: Needs assistance Equipment used: Rolling walker (2 wheeled) Transfers: Sit to/from Stand Sit to Stand: Supervision         General transfer comment: for safety, improper hand placement when rising.  Ambulation/Gait Ambulation/Gait assistance: Supervision Gait Distance  (Feet): 30 Feet (+81+01) Assistive device: Rolling walker (2 wheeled) Gait Pattern/deviations: Step-through pattern;Trunk flexed;Decreased stride length Gait velocity: decr   General Gait Details: supervision for safety, verbal cuing for breathing technique throughout ambulation, SpO2 minimum on 8LO2 78% requiring 10LO2 at times and seated rest break x3 minutes. x2 seated rest breaks.   Stairs             Wheelchair Mobility    Modified Rankin (Stroke Patients Only)       Balance Overall balance assessment: Needs assistance   Sitting balance-Leahy Scale: Good       Standing balance-Leahy Scale: Fair                              Cognition Arousal/Alertness: Awake/alert Behavior During Therapy: WFL for tasks assessed/performed Overall Cognitive Status: Within Functional Limits for tasks assessed                                 General Comments: Spouse is also here with COVID in room 1539; motivated to progress mobility      Exercises Other Exercises Other Exercises: Breathing technique - in through nose, out through mouth, slow inhale and exhale    General Comments        Pertinent Vitals/Pain Pain Assessment: No/denies pain Pain Intervention(s): Limited activity within patient's tolerance;Monitored during session    Home Living  Prior Function            PT Goals (current goals can now be found in the care plan section) Acute Rehab PT Goals Patient Stated Goal: 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, community, go to church PT Goal Formulation: With patient Time For Goal Achievement: 07/08/20 Potential to Achieve Goals: Good Progress towards PT goals: Progressing toward goals    Frequency    Min 3X/week      PT Plan Current plan remains appropriate    Co-evaluation              AM-PAC PT "6 Clicks" Mobility   Outcome Measure  Help needed turning from your back to your side while in a flat bed  without using bedrails?: None Help needed moving from lying on your back to sitting on the side of a flat bed without using bedrails?: None Help needed moving to and from a bed to a chair (including a wheelchair)?: None Help needed standing up from a chair using your arms (e.g., wheelchair or bedside chair)?: None Help needed to walk in hospital room?: A Little Help needed climbing 3-5 steps with a railing? : A Little 6 Click Score: 22    End of Session Equipment Utilized During Treatment: Gait belt Activity Tolerance: Patient limited by fatigue Patient left: in chair;with call bell/phone within reach Nurse Communication: Mobility status PT Visit Diagnosis: Difficulty in walking, not elsewhere classified (R26.2)     Time: 6060-0459 PT Time Calculation (min) (ACUTE ONLY): 32 min  Charges:  $Gait Training: 8-22 mins $Therapeutic Activity: 8-22 mins                     Latisa Belay E, PT Acute Rehabilitation Services Pager [phone]  Office [phone]    Roxine Caddy D Elonda Husky 06/27/2020, 3:26 PM

## 2020-06-27 NOTE — Progress Notes (Signed)
PROGRESS NOTE    Bryan Kirby  BZJ:[id number] DOB: 05/10/1956 DOA: 06/22/2020 PCP: Owens Loffler, MD     Brief Narrative:  Bryan Kirby is a 64 y.o.malewith medical history significant forobesity, OSAon CPAP, chronic anxiety/depression, HLD, GERD, recent diagnosis of Covid-19 viral infection on 06/15/20 at [facility], had monoclonal antibodies on 9/19/21and felt worse afterwardswho presented with worsening dyspnea of 2 days duration. Associated with persistent non productive cough,bodys aches, chills, poor appetite and generalized weakness.Had diarrhea for 4 days prior to his first diagnosis of Covid-19 infection, GI symptoms have now resolved. Admits to pleuritic chest pain, worse when he coughs. Denies lower extremities edema or tenderness. At home hisoxygen saturation was 86% on room air. Concerned about his hypoxia he decided to come to the ED for further evaluation and management.   New events last 24 hours / Subjective: Doing well this morning without acute complaints. He was weaned to 7.5L O2 and tolerating without distress.   Assessment & Plan:   Active Problems:   Pneumonia due to COVID-19 virus   Acute hypoxemic respiratory failure secondary to COVID-19 -Remains on 7.5L high flow nasal cannula O2, wean as able  COVID-19 -Tested positive 9/13 and again 9/20 -Received monoclonal antibodies 9/19 -Continue Remdesivir, baricitinib, IV Solu-Medrol  COVID-19 Labs Recent Labs    06/25/20 0426 06/26/20 0408 06/27/20 0524  DDIMER 1.29* 1.05* 1.02*  FERRITIN 320 236 196  CRP 2.5* 1.0* 0.6   Weakness -PT recommends home health  Obesity -Estimated body mass index is 38.35 kg/m as calculated from the following:   Height as of this encounter: 5\' 11"  (1.803 m).   Weight as of this encounter: 124.7 kg.  OSA -CPAP nightly  Depression/anxiety -Continue Lexapro   DVT prophylaxis: Lovenox  Code Status: Partial code, DNI Family Communication:  No family at bedside, wife is admitted to the hospital as well with Covid  Disposition Plan:  Status is: Inpatient  Remains inpatient appropriate because:Inpatient level of care appropriate due to severity of illness   Dispo:  Patient From: Home  Planned Disposition: Home  Expected discharge date: 2 days   Medically stable for discharge: No remains on high flow nasal cannula O2 this morning, continue to wean as tolerated. Hopeful discharge home 9/27 if O2 requirements < 4L     Consultants:   None  Procedures:   None  Antimicrobials:  Anti-infectives (From admission, onward)   Start     Dose/Rate Route Frequency Ordered Stop   06/23/20 1000  remdesivir 100 mg in sodium chloride 0.9 % 100 mL IVPB       "Followed by" Linked Group Details   100 mg 200 mL/hr over 30 Minutes Intravenous Daily 06/22/20 1353 06/26/20 1015   06/22/20 1530  remdesivir 200 mg in sodium chloride 0.9% 250 mL IVPB       "Followed by" Linked Group Details   200 mg 580 mL/hr over 30 Minutes Intravenous Once 06/22/20 1353 06/22/20 2138       Objective: Vitals:   06/26/20 1500 06/26/20 2104 06/26/20 2205 06/27/20 0535  BP:  121/75  137/75  Pulse:  60 66 (!) 59  Resp:  20 20 (!) 24  Temp:  97.9 F (36.6 C)  98.1 F (36.7 C)  TempSrc:  Oral    SpO2: 95% 91% 93% 97%  Weight:      Height:        Intake/Output Summary (Last 24 hours) at 06/27/2020 1038 Last data filed at 06/26/2020 1700 Gross per  24 hour  Intake 780.06 ml  Output 1425 ml  Net -644.94 ml   Filed Weights   06/22/20 1313  Weight: 124.7 kg    Examination: General exam: Appears calm and comfortable  Respiratory system: Clear to auscultation. Respiratory effort normal. Without conversational dyspnea  Cardiovascular system: S1 & S2 heard, RRR. No pedal edema. Gastrointestinal system: Abdomen is nondistended, soft and nontender. Normal bowel sounds heard. Central nervous system: Alert and oriented. Non focal exam. Speech clear    Extremities: Symmetric in appearance bilaterally  Skin: No rashes, lesions or ulcers on exposed skin  Psychiatry: Judgement and insight appear stable. Mood & affect appropriate.    Data Reviewed: I have personally reviewed following labs and imaging studies  CBC: Recent Labs  Lab 06/23/20 0455 06/24/20 0415 06/25/20 0426 06/26/20 0408 06/27/20 0524  WBC 5.1 9.5 11.3* 11.5* 12.2*  NEUTROABS 4.7 8.5* 9.7* 9.9* 10.5*  HGB 12.7* 14.2 13.9 13.2 13.0  HCT 37.8* 43.5 42.5 40.3 39.7  MCV 88.5 91.0 90.4 90.4 90.4  PLT 229 335 370 390 638*   Basic Metabolic Panel: Recent Labs  Lab 06/23/20 0455 06/24/20 0415 06/25/20 0426 06/26/20 0408 06/27/20 0524  NA 134* 130* 132* 133* 133*  K 3.7 3.7 4.5 4.6 5.0  CL 102 94* 96* 97* 95*  CO2 22 24 24 25 30   GLUCOSE 157* 185* 174* 177* 172*  BUN 13 23 26* 23 23  CREATININE 0.84 0.95 0.83 0.74 0.80  CALCIUM 7.4* 8.4* 8.4* 8.3* 8.4*  MG 2.1 2.5* 2.5*  --   --   PHOS 2.9 3.5 3.7  --   --    GFR: Estimated Creatinine Clearance: 130.4 mL/min (by C-G formula based on SCr of 0.8 mg/dL). Liver Function Tests: Recent Labs  Lab 06/23/20 0455 06/24/20 0415 06/25/20 0426 06/26/20 0408 06/27/20 0524  AST 137* 120* 101* 78* 48*  ALT 70* 83* 87* 91* 83*  ALKPHOS 37* 44 44 41 39  BILITOT 0.6 0.8 0.8 0.6 0.5  PROT 5.6* 6.5 6.0* 5.8* 5.7*  ALBUMIN 2.5* 2.9* 2.8* 2.7* 2.6*   No results for input(s): LIPASE, AMYLASE in the last 168 hours. No results for input(s): AMMONIA in the last 168 hours. Coagulation Profile: No results for input(s): INR, PROTIME in the last 168 hours. Cardiac Enzymes: No results for input(s): CKTOTAL, CKMB, CKMBINDEX, TROPONINI in the last 168 hours. BNP (last 3 results) No results for input(s): PROBNP in the last 8760 hours. HbA1C: No results for input(s): HGBA1C in the last 72 hours. CBG: Recent Labs  Lab 06/26/20 0753 06/26/20 1101 06/26/20 1617 06/26/20 2103 06/27/20 0746  GLUCAP 159* 206* 177* 172* 140*    Lipid Profile: No results for input(s): CHOL, HDL, LDLCALC, TRIG, CHOLHDL, LDLDIRECT in the last 72 hours. Thyroid Function Tests: No results for input(s): TSH, T4TOTAL, FREET4, T3FREE, THYROIDAB in the last 72 hours. Anemia Panel: Recent Labs    06/26/20 0408 06/27/20 0524  FERRITIN 236 196   Sepsis Labs: Recent Labs  Lab 06/22/20 1430 06/22/20 1547  PROCALCITON 0.20  --   LATICACIDVEN 1.7 1.7    Recent Results (from the past 240 hour(s))  SARS Coronavirus 2 by RT PCR (hospital order, performed in [facility] hospital lab) Nasopharyngeal Nasopharyngeal Swab     Status: Abnormal   Collection Time: 06/22/20  2:15 PM   Specimen: Nasopharyngeal Swab  Result Value Ref Range Status   SARS Coronavirus 2 POSITIVE (A) NEGATIVE Final    Comment: RESULT CALLED TO, READ BACK BY  AND VERIFIED WITH: S.WEST,RN [id number] @1650  BY V.WILKINS (NOTE) SARS-CoV-2 target nucleic acids are DETECTED  SARS-CoV-2 RNA is generally detectable in upper respiratory specimens  during the acute phase of infection.  Positive results are indicative  of the presence of the identified virus, but do not rule out bacterial infection or co-infection with other pathogens not detected by the test.  Clinical correlation with patient history and  other diagnostic information is necessary to determine patient infection status.  The expected result is negative.  Fact Sheet for Patients:   StrictlyIdeas.no   Fact Sheet for Healthcare Providers:   BankingDealers.co.za    This test is not yet approved or cleared by the [place] FDA and  has been authorized for detection and/or diagnosis of SARS-CoV-2 by FDA under an Emergency Use Authorization (EUA).  This EUA will remain in effect (meaning this t est can be used) for the duration of  the COVID-19 declaration under Section 564(b)(1) of the Act, 21 U.S.C. section 360-bbb-3(b)(1), unless the authorization  is terminated or revoked sooner.  Performed at [facility], [place] [address] [place], [place] [address]   Blood Culture (routine x 2)     Status: None (Preliminary result)   Collection Time: 06/22/20  3:42 PM   Specimen: BLOOD LEFT FOREARM  Result Value Ref Range Status   Specimen Description   Final    BLOOD LEFT FOREARM Performed at [facility] [address], [place], [place] [address]    Special Requests   Final    BOTTLES DRAWN AEROBIC AND ANAEROBIC Blood Culture results may not be optimal due to an inadequate volume of blood received in culture bottles Performed at [facility] [address], [place], [place] [address]    Culture   Final    NO GROWTH 3 DAYS Performed at [facility], [place] [address] [place], [place] [address]    Report Status PENDING  Incomplete  Blood Culture (routine x 2)     Status: None (Preliminary result)   Collection Time: 06/22/20  4:20 PM   Specimen: BLOOD  Result Value Ref Range Status   Specimen Description   Final    BLOOD SITE NOT SPECIFIED Performed at [facility] [address], [place], [place] [address]    Special Requests   Final    BOTTLES DRAWN AEROBIC AND ANAEROBIC Blood Culture results may not be optimal due to an inadequate volume of blood received in culture bottles Performed at [facility] [address], [place], [place] [address]    Culture   Final    NO GROWTH 3 DAYS Performed at [facility], [place] [address] [place],  [address]    Report Status PENDING  Incomplete      Radiology Studies: No results found.    Scheduled Meds: . albuterol  2 puff Inhalation Q6H  . vitamin C  500 mg Oral Daily  . aspirin EC  81 mg Oral Daily  . baricitinib  4 mg Oral Daily  . cholecalciferol  1,000 Units Oral Daily  . enoxaparin (LOVENOX) injection  60 mg Subcutaneous Q24H  . escitalopram  20 mg Oral Daily  . feeding  supplement (ENSURE ENLIVE)  237 mL Oral BID BM  . folic acid  1 mg Oral Daily  . insulin aspart  0-5 Units Subcutaneous QHS  . insulin aspart  0-9 Units Subcutaneous TID WC  . ipratropium  2 puff Inhalation  Q6H  . methylPREDNISolone (SOLU-MEDROL) injection  60 mg Intravenous Q12H   Followed by  . [START ON 06/30/2020] predniSONE  50 mg Oral QAC breakfast  . multivitamin with minerals  1 tablet Oral Daily  . pantoprazole  40 mg Oral Daily  . senna-docusate  1 tablet Oral QHS  . zinc sulfate  220 mg Oral Daily   Continuous Infusions:    LOS: 5 days      Time spent: 40 minutes   Dessa Phi, DO [facility] 06/27/2020, 10:38 AM   Available via Epic secure chat 7am-7pm After these hours, please refer to coverage provider listed on [email or web address]

## 2020-06-28 LAB — CBC WITH DIFFERENTIAL/PLATELET
Abs Immature Granulocytes: 0.61 10*3/uL — ABNORMAL HIGH (ref 0.00–0.07)
Basophils Absolute: 0.1 10*3/uL (ref 0.0–0.1)
Basophils Relative: 1 %
Eosinophils Absolute: 0 10*3/uL (ref 0.0–0.5)
Eosinophils Relative: 0 %
HCT: 40.2 % (ref 39.0–52.0)
Hemoglobin: 13.1 g/dL (ref 13.0–17.0)
Immature Granulocytes: 5 %
Lymphocytes Relative: 4 %
Lymphs Abs: 0.5 10*3/uL — ABNORMAL LOW (ref 0.7–4.0)
MCH: 29.6 pg (ref 26.0–34.0)
MCHC: 32.6 g/dL (ref 30.0–36.0)
MCV: 91 fL (ref 80.0–100.0)
Monocytes Absolute: 0.9 10*3/uL (ref 0.1–1.0)
Monocytes Relative: 7 %
Neutro Abs: 10.9 10*3/uL — ABNORMAL HIGH (ref 1.7–7.7)
Neutrophils Relative %: 83 %
Platelets: 485 10*3/uL — ABNORMAL HIGH (ref 150–400)
RBC: 4.42 MIL/uL (ref 4.22–5.81)
RDW: 13.1 % (ref 11.5–15.5)
WBC: 13 10*3/uL — ABNORMAL HIGH (ref 4.0–10.5)
nRBC: 0 % (ref 0.0–0.2)

## 2020-06-28 LAB — COMPREHENSIVE METABOLIC PANEL
ALT: 69 U/L — ABNORMAL HIGH (ref 0–44)
AST: 31 U/L (ref 15–41)
Albumin: 2.7 g/dL — ABNORMAL LOW (ref 3.5–5.0)
Alkaline Phosphatase: 39 U/L (ref 38–126)
Anion gap: 7 (ref 5–15)
BUN: 22 mg/dL (ref 8–23)
CO2: 30 mmol/L (ref 22–32)
Calcium: 8.3 mg/dL — ABNORMAL LOW (ref 8.9–10.3)
Chloride: 95 mmol/L — ABNORMAL LOW (ref 98–111)
Creatinine, Ser: 0.78 mg/dL (ref 0.61–1.24)
GFR calc Af Amer: >60 mL/min (ref >60)
GFR calc non Af Amer: >60 mL/min (ref >60)
Glucose, Bld: 162 mg/dL — ABNORMAL HIGH (ref 70–99)
Potassium: 5.2 mmol/L — ABNORMAL HIGH (ref 3.5–5.1)
Sodium: 132 mmol/L — ABNORMAL LOW (ref 135–145)
Total Bilirubin: 0.7 mg/dL (ref 0.3–1.2)
Total Protein: 5.7 g/dL — ABNORMAL LOW (ref 6.5–8.1)

## 2020-06-28 LAB — GLUCOSE, CAPILLARY
Glucose-Capillary: 129 mg/dL — ABNORMAL HIGH (ref 70–99)
Glucose-Capillary: 241 mg/dL — ABNORMAL HIGH (ref 70–99)
Glucose-Capillary: 246 mg/dL — ABNORMAL HIGH (ref 70–99)
Glucose-Capillary: 245 mg/dL — ABNORMAL HIGH (ref 70–99)

## 2020-06-28 LAB — FERRITIN: Ferritin: 162 ng/mL (ref 24–336)

## 2020-06-28 LAB — C-REACTIVE PROTEIN: CRP: 0.6 mg/dL (ref <1.0)

## 2020-06-28 LAB — D-DIMER, QUANTITATIVE: D-Dimer, Quant: 1 ug/mL-FEU — ABNORMAL HIGH (ref 0.00–0.50)

## 2020-06-28 MED ORDER — ALBUTEROL SULFATE HFA 108 (90 BASE) MCG/ACT IN AERS
2.0000 | INHALATION_SPRAY | Freq: Three times a day (TID) | RESPIRATORY_TRACT | Status: DC
  Administered 2020-06-28 – 2020-06-30 (×9): 2 via RESPIRATORY_TRACT

## 2020-06-28 MED ORDER — IPRATROPIUM BROMIDE HFA 17 MCG/ACT IN AERS
2.0000 | INHALATION_SPRAY | Freq: Three times a day (TID) | RESPIRATORY_TRACT | Status: DC
  Administered 2020-06-28 – 2020-06-30 (×9): 2 via RESPIRATORY_TRACT
  Filled 2020-06-28: qty 12.9

## 2020-06-28 NOTE — Progress Notes (Signed)
PROGRESS NOTE    Bryan Kirby  UTM:[id number] DOB: November 27, 1955 DOA: 06/22/2020 PCP: Owens Loffler, MD     Brief Narrative:  Bryan Kirby is a 64 y.o.malewith medical history significant forobesity, OSAon CPAP, chronic anxiety/depression, HLD, GERD, recent diagnosis of Covid-19 viral infection on 06/15/20 at [facility], had monoclonal antibodies on 9/19/21and felt worse afterwardswho presented with worsening dyspnea of 2 days duration. Associated with persistent non productive cough,bodys aches, chills, poor appetite and generalized weakness.Had diarrhea for 4 days prior to his first diagnosis of Covid-19 infection, GI symptoms have now resolved. Admits to pleuritic chest pain, worse when he coughs. Denies lower extremities edema or tenderness. At home hisoxygen saturation was 86% on room air. Concerned about his hypoxia he decided to come to the ED for further evaluation and management.   New events last 24 hours / Subjective: No new complaints today.  He was weaned to 6 L nasal cannula O2 and doing well  Assessment & Plan:   Active Problems:   Pneumonia due to COVID-19 virus   Acute hypoxemic respiratory failure secondary to COVID-19 -Remains on 6 L nasal cannula O2, wean as able  COVID-19 -Tested positive 9/13 and again 9/20 -Received monoclonal antibodies 9/19 -Completed Remdesivir 9/20-9/24 -Continue baricitinib 9/20 >> -Continue IV Solu-Medrol 9/20 >>   COVID-19 Labs Recent Labs    06/26/20 0408 06/27/20 0524 06/28/20 0542  DDIMER 1.05* 1.02* 1.00*  FERRITIN 236 196 162  CRP 1.0* 0.6 0.6   Weakness -PT recommends home health  Obesity -Estimated body mass index is 38.35 kg/m as calculated from the following:   Height as of this encounter: 5\' 11"  (1.803 m).   Weight as of this encounter: 124.7 kg.  OSA -CPAP nightly  Depression/anxiety -Continue Lexapro   DVT prophylaxis: Lovenox  Code Status: Partial code, DNI Family  Communication: No family at bedside, wife is admitted to the hospital as well with Covid  Disposition Plan:  Status is: Inpatient  Remains inpatient appropriate because:Inpatient level of care appropriate due to severity of illness   Dispo:  Patient From: Home  Planned Disposition: Home  Expected discharge date: 1 day   Medically stable for discharge: No remains on high flow nasal cannula O2 this morning, continue to wean as tolerated. Hopeful discharge home 9/27 if O2 requirements < 4L     Consultants:   None  Procedures:   None  Antimicrobials:  Anti-infectives (From admission, onward)   Start     Dose/Rate Route Frequency Ordered Stop   06/23/20 1000  remdesivir 100 mg in sodium chloride 0.9 % 100 mL IVPB       "Followed by" Linked Group Details   100 mg 200 mL/hr over 30 Minutes Intravenous Daily 06/22/20 1353 06/26/20 1015   06/22/20 1530  remdesivir 200 mg in sodium chloride 0.9% 250 mL IVPB       "Followed by" Linked Group Details   200 mg 580 mL/hr over 30 Minutes Intravenous Once 06/22/20 1353 06/22/20 2138       Objective: Vitals:   06/27/20 1545 06/27/20 1600 06/27/20 1954 06/28/20 0529  BP:   138/75 136/84  Pulse:   63 (!) 56  Resp:   (!) 22 19  Temp:   98 F (36.7 C) 97.7 F (36.5 C)  TempSrc:      SpO2: 96% 91% 96% 92%  Weight:      Height:        Intake/Output Summary (Last 24 hours) at 06/28/2020 1046 Last data  filed at 06/28/2020 0945 Gross per 24 hour  Intake 480 ml  Output 2400 ml  Net -1920 ml   Filed Weights   06/22/20 1313  Weight: 124.7 kg    Examination: General exam: Appears calm and comfortable  Respiratory system: Clear to auscultation. Respiratory effort normal. Without conversational dyspnea  Cardiovascular system: S1 & S2 heard, RRR. No pedal edema. Gastrointestinal system: Abdomen is nondistended, soft and nontender. Normal bowel sounds heard. Central nervous system: Alert and oriented. Non focal exam. Speech clear    Extremities: Symmetric in appearance bilaterally  Skin: No rashes, lesions or ulcers on exposed skin  Psychiatry: Judgement and insight appear stable. Mood & affect appropriate.   Data Reviewed: I have personally reviewed following labs and imaging studies  CBC: Recent Labs  Lab 06/24/20 0415 06/25/20 0426 06/26/20 0408 06/27/20 0524 06/28/20 0542  WBC 9.5 11.3* 11.5* 12.2* 13.0*  NEUTROABS 8.5* 9.7* 9.9* 10.5* 10.9*  HGB 14.2 13.9 13.2 13.0 13.1  HCT 43.5 42.5 40.3 39.7 40.2  MCV 91.0 90.4 90.4 90.4 91.0  PLT 335 370 390 423* 299*   Basic Metabolic Panel: Recent Labs  Lab 06/23/20 0455 06/23/20 0455 06/24/20 0415 06/25/20 0426 06/26/20 0408 06/27/20 0524 06/28/20 0542  NA 134*   < > 130* 132* 133* 133* 132*  K 3.7   < > 3.7 4.5 4.6 5.0 5.2*  CL 102   < > 94* 96* 97* 95* 95*  CO2 22   < > 24 24 25 30 30   GLUCOSE 157*   < > 185* 174* 177* 172* 162*  BUN 13   < > 23 26* 23 23 22   CREATININE 0.84   < > 0.95 0.83 0.74 0.80 0.78  CALCIUM 7.4*   < > 8.4* 8.4* 8.3* 8.4* 8.3*  MG 2.1  --  2.5* 2.5*  --   --   --   PHOS 2.9  --  3.5 3.7  --   --   --    < > = values in this interval not displayed.   GFR: Estimated Creatinine Clearance: 130.4 mL/min (by C-G formula based on SCr of 0.78 mg/dL). Liver Function Tests: Recent Labs  Lab 06/24/20 0415 06/25/20 0426 06/26/20 0408 06/27/20 0524 06/28/20 0542  AST 120* 101* 78* 48* 31  ALT 83* 87* 91* 83* 69*  ALKPHOS 44 44 41 39 39  BILITOT 0.8 0.8 0.6 0.5 0.7  PROT 6.5 6.0* 5.8* 5.7* 5.7*  ALBUMIN 2.9* 2.8* 2.7* 2.6* 2.7*   No results for input(s): LIPASE, AMYLASE in the last 168 hours. No results for input(s): AMMONIA in the last 168 hours. Coagulation Profile: No results for input(s): INR, PROTIME in the last 168 hours. Cardiac Enzymes: No results for input(s): CKTOTAL, CKMB, CKMBINDEX, TROPONINI in the last 168 hours. BNP (last 3 results) No results for input(s): PROBNP in the last 8760 hours. HbA1C: No  results for input(s): HGBA1C in the last 72 hours. CBG: Recent Labs  Lab 06/27/20 0746 06/27/20 1139 06/27/20 1652 06/27/20 2057 06/28/20 0725  GLUCAP 140* 215* 229* 248* 129*   Lipid Profile: No results for input(s): CHOL, HDL, LDLCALC, TRIG, CHOLHDL, LDLDIRECT in the last 72 hours. Thyroid Function Tests: No results for input(s): TSH, T4TOTAL, FREET4, T3FREE, THYROIDAB in the last 72 hours. Anemia Panel: Recent Labs    06/27/20 0524 06/28/20 0542  FERRITIN 196 162   Sepsis Labs: Recent Labs  Lab 06/22/20 1430 06/22/20 1547  PROCALCITON 0.20  --   LATICACIDVEN 1.7 1.7  Recent Results (from the past 240 hour(s))  SARS Coronavirus 2 by RT PCR (hospital order, performed in [facility] hospital lab) Nasopharyngeal Nasopharyngeal Swab     Status: Abnormal   Collection Time: 06/22/20  2:15 PM   Specimen: Nasopharyngeal Swab  Result Value Ref Range Status   SARS Coronavirus 2 POSITIVE (A) NEGATIVE Final    Comment: RESULT CALLED TO, READ BACK BY AND VERIFIED WITH: S.WEST,RN [id number] @1650  BY V.WILKINS (NOTE) SARS-CoV-2 target nucleic acids are DETECTED  SARS-CoV-2 RNA is generally detectable in upper respiratory specimens  during the acute phase of infection.  Positive results are indicative  of the presence of the identified virus, but do not rule out bacterial infection or co-infection with other pathogens not detected by the test.  Clinical correlation with patient history and  other diagnostic information is necessary to determine patient infection status.  The expected result is negative.  Fact Sheet for Patients:   StrictlyIdeas.no   Fact Sheet for Healthcare Providers:   BankingDealers.co.za    This test is not yet approved or cleared by the [place] FDA and  has been authorized for detection and/or diagnosis of SARS-CoV-2 by FDA under an Emergency Use Authorization (EUA).  This EUA will remain in effect  (meaning this t est can be used) for the duration of  the COVID-19 declaration under Section 564(b)(1) of the Act, 21 U.S.C. section 360-bbb-3(b)(1), unless the authorization is terminated or revoked sooner.  Performed at [facility], [place] [address] [place], [place] [address]   Blood Culture (routine x 2)     Status: None   Collection Time: 06/22/20  3:42 PM   Specimen: BLOOD LEFT FOREARM  Result Value Ref Range Status   Specimen Description   Final    BLOOD LEFT FOREARM Performed at [facility] [address] [place], [place] [address]    Special Requests   Final    BOTTLES DRAWN AEROBIC AND ANAEROBIC Blood Culture results may not be optimal due to an inadequate volume of blood received in culture bottles Performed at [facility] [address], [place], [place] [address]    Culture   Final    NO GROWTH 5 DAYS Performed at [facility], [place] [address], [place], [place] [address]    Report Status 06/27/2020 FINAL  Final  Blood Culture (routine x 2)     Status: None   Collection Time: 06/22/20  4:20 PM   Specimen: BLOOD  Result Value Ref Range Status   Specimen Description   Final    BLOOD SITE NOT SPECIFIED Performed at [facility] [address], [place], [place] [address]    Special Requests   Final    BOTTLES DRAWN AEROBIC AND ANAEROBIC Blood Culture results may not be optimal due to an inadequate volume of blood received in culture bottles Performed at [facility] [address], [place], [place] [address]    Culture   Final    NO GROWTH 5 DAYS Performed at [facility], [place] [address] [place], [place] [address]    Report Status 06/27/2020 FINAL  Final      Radiology Studies: No results found.    Scheduled Meds: . albuterol  2 puff Inhalation TID  . vitamin C  500 mg Oral Daily  . aspirin EC  81 mg Oral Daily  . baricitinib  4 mg Oral Daily  .  cholecalciferol  1,000 Units  Oral Daily  . enoxaparin (LOVENOX) injection  60 mg Subcutaneous Q24H  . escitalopram  20 mg Oral Daily  . feeding supplement (ENSURE ENLIVE)  237 mL Oral BID BM  . folic acid  1 mg Oral Daily  . insulin aspart  0-5 Units Subcutaneous QHS  . insulin aspart  0-9 Units Subcutaneous TID WC  . ipratropium  2 puff Inhalation TID  . methylPREDNISolone (SOLU-MEDROL) injection  60 mg Intravenous Q12H   Followed by  . [START ON 06/30/2020] predniSONE  50 mg Oral QAC breakfast  . multivitamin with minerals  1 tablet Oral Daily  . pantoprazole  40 mg Oral Daily  . senna-docusate  1 tablet Oral QHS  . zinc sulfate  220 mg Oral Daily   Continuous Infusions:    LOS: 6 days      Time spent: 40 minutes   Dessa Phi, DO [facility] 06/28/2020, 10:46 AM   Available via Epic secure chat 7am-7pm After these hours, please refer to coverage provider listed on [email or web address]

## 2020-06-29 ENCOUNTER — Other Ambulatory Visit: Payer: Self-pay

## 2020-06-29 LAB — COMPREHENSIVE METABOLIC PANEL
ALT: 60 U/L — ABNORMAL HIGH (ref 0–44)
AST: 29 U/L (ref 15–41)
Albumin: 2.8 g/dL — ABNORMAL LOW (ref 3.5–5.0)
Alkaline Phosphatase: 43 U/L (ref 38–126)
Anion gap: 8 (ref 5–15)
BUN: 20 mg/dL (ref 8–23)
CO2: 30 mmol/L (ref 22–32)
Calcium: 8.9 mg/dL (ref 8.9–10.3)
Chloride: 98 mmol/L (ref 98–111)
Creatinine, Ser: 0.86 mg/dL (ref 0.61–1.24)
GFR calc Af Amer: >60 mL/min (ref >60)
GFR calc non Af Amer: >60 mL/min (ref >60)
Glucose, Bld: 125 mg/dL — ABNORMAL HIGH (ref 70–99)
Potassium: 5.5 mmol/L — ABNORMAL HIGH (ref 3.5–5.1)
Sodium: 136 mmol/L (ref 135–145)
Total Bilirubin: 0.9 mg/dL (ref 0.3–1.2)
Total Protein: 6.6 g/dL (ref 6.5–8.1)

## 2020-06-29 LAB — CBC WITH DIFFERENTIAL/PLATELET
Abs Immature Granulocytes: 0.73 10*3/uL — ABNORMAL HIGH (ref 0.00–0.07)
Basophils Absolute: 0.1 10*3/uL (ref 0.0–0.1)
Basophils Relative: 0 %
Eosinophils Absolute: 0 10*3/uL (ref 0.0–0.5)
Eosinophils Relative: 0 %
HCT: 42.8 % (ref 39.0–52.0)
Hemoglobin: 14 g/dL (ref 13.0–17.0)
Immature Granulocytes: 5 %
Lymphocytes Relative: 3 %
Lymphs Abs: 0.5 10*3/uL — ABNORMAL LOW (ref 0.7–4.0)
MCH: 29.6 pg (ref 26.0–34.0)
MCHC: 32.7 g/dL (ref 30.0–36.0)
MCV: 90.5 fL (ref 80.0–100.0)
Monocytes Absolute: 0.9 10*3/uL (ref 0.1–1.0)
Monocytes Relative: 6 %
Neutro Abs: 11.6 10*3/uL — ABNORMAL HIGH (ref 1.7–7.7)
Neutrophils Relative %: 86 %
Platelets: 426 10*3/uL — ABNORMAL HIGH (ref 150–400)
RBC: 4.73 MIL/uL (ref 4.22–5.81)
RDW: 13 % (ref 11.5–15.5)
WBC: 13.7 10*3/uL — ABNORMAL HIGH (ref 4.0–10.5)
nRBC: 0 % (ref 0.0–0.2)

## 2020-06-29 LAB — GLUCOSE, CAPILLARY
Glucose-Capillary: 116 mg/dL — ABNORMAL HIGH (ref 70–99)
Glucose-Capillary: 179 mg/dL — ABNORMAL HIGH (ref 70–99)
Glucose-Capillary: 290 mg/dL — ABNORMAL HIGH (ref 70–99)
Glucose-Capillary: 289 mg/dL — ABNORMAL HIGH (ref 70–99)

## 2020-06-29 LAB — C-REACTIVE PROTEIN: CRP: <0.5 mg/dL (ref <1.0)

## 2020-06-29 LAB — D-DIMER, QUANTITATIVE: D-Dimer, Quant: 1.02 ug/mL-FEU — ABNORMAL HIGH (ref 0.00–0.50)

## 2020-06-29 LAB — FERRITIN: Ferritin: 175 ng/mL (ref 24–336)

## 2020-06-29 MED ORDER — FUROSEMIDE 10 MG/ML IJ SOLN
40.0000 mg | Freq: Once | INTRAMUSCULAR | Status: AC
  Administered 2020-06-29: 40 mg via INTRAVENOUS
  Filled 2020-06-29: qty 4

## 2020-06-29 MED ORDER — SENNA 8.6 MG PO TABS: 1.0000 | ORAL_TABLET | Freq: Every day | ORAL | Status: DC | PRN

## 2020-06-29 MED ORDER — POLYETHYLENE GLYCOL 3350 17 G PO PACK
17.0000 g | PACK | Freq: Every day | ORAL | Status: DC
  Administered 2020-06-30 – 2020-07-01 (×2): 17 g via ORAL
  Filled 2020-06-29 (×2): qty 1

## 2020-06-29 NOTE — Progress Notes (Addendum)
PROGRESS NOTE    Bryan Kirby  ZOX:[id number] DOB: November 27, 1955 DOA: 06/22/2020 PCP: Owens Loffler, MD     Brief Narrative:  Bryan Kirby is a 64 y.o.malewith medical history significant forobesity, OSAon CPAP, chronic anxiety/depression, HLD, GERD, recent diagnosis of Covid-19 viral infection on 06/15/20 at [facility], had monoclonal antibodies on 9/19/21and felt worse afterwardswho presented with worsening dyspnea of 2 days duration. Associated with persistent non productive cough,bodys aches, chills, poor appetite and generalized weakness.Had diarrhea for 4 days prior to his first diagnosis of Covid-19 infection, GI symptoms have now resolved. Admits to pleuritic chest pain, worse when he coughs. Denies lower extremities edema or tenderness. At home hisoxygen saturation was 86% on room air. Concerned about his hypoxia he decided to come to the ED for further evaluation and management.   New events last 24 hours / Subjective: No new complaints, feeling well overall.  During my examination, he appeared comfortable on 6 L nasal cannula O2, however satting in the high 80s.  Assessment & Plan:   Active Problems:   Pneumonia due to COVID-19 virus   Acute hypoxemic respiratory failure secondary to COVID-19 -Remains on 6 L nasal cannula O2, wean as able  COVID-19 -Tested positive 9/13 and again 9/20 -Received monoclonal antibodies 9/19 -Completed Remdesivir 9/20-9/24 -Continue baricitinib 9/20 >> -Continue steroids 9/20 >>    COVID-19 Labs Recent Labs    06/27/20 0524 06/28/20 0542 06/29/20 0451 06/29/20 0753  DDIMER 1.02* 1.00* 1.02*  --   FERRITIN 196 162  --  175  CRP 0.6 0.6 <0.50  --    Weakness -PT recommends home health  Obesity -Estimated body mass index is 38.35 kg/m as calculated from the following:   Height as of this encounter: 5\' 11"  (1.803 m).   Weight as of this encounter: 124.7 kg.  OSA -CPAP  nightly  Depression/anxiety -Continue Lexapro  Mild hyperkalemia -IV lasix x 1    DVT prophylaxis: Lovenox  Code Status: Partial code, DNI Family Communication: No family at bedside, wife is admitted to the hospital as well with Covid  Disposition Plan:  Status is: Inpatient  Remains inpatient appropriate because:Inpatient level of care appropriate due to severity of illness   Dispo:  Patient From: Home  Planned Disposition: Home  Expected discharge date: 1 day   Medically stable for discharge: No remains on high flow nasal cannula O2 this morning, continue to wean as tolerated. Hopeful discharge home 9/28 if O2 requirements < 4L     Consultants:   None  Procedures:   None  Antimicrobials:  Anti-infectives (From admission, onward)   Start     Dose/Rate Route Frequency Ordered Stop   06/23/20 1000  remdesivir 100 mg in sodium chloride 0.9 % 100 mL IVPB       "Followed by" Linked Group Details   100 mg 200 mL/hr over 30 Minutes Intravenous Daily 06/22/20 1353 06/26/20 1015   06/22/20 1530  remdesivir 200 mg in sodium chloride 0.9% 250 mL IVPB       "Followed by" Linked Group Details   200 mg 580 mL/hr over 30 Minutes Intravenous Once 06/22/20 1353 06/22/20 2138       Objective: Vitals:   06/28/20 1335 06/28/20 2001 06/28/20 2052 06/29/20 0555  BP: 131/75  (!) 143/88 (!) 141/90  Pulse: 81  65 (!) 58  Resp: 18  18 18   Temp: 97.9 F (36.6 C)  97.7 F (36.5 C) 97.7 F (36.5 C)  TempSrc: Oral  Oral Oral  SpO2: (!) 87% 91% 95% 94%  Weight:      Height:        Intake/Output Summary (Last 24 hours) at 06/29/2020 1042 Last data filed at 06/29/2020 1029 Gross per 24 hour  Intake 480 ml  Output 3250 ml  Net -2770 ml   Filed Weights   06/22/20 1313  Weight: 124.7 kg   Examination: General exam: Appears calm and comfortable  Respiratory system: Clear to auscultation. Respiratory effort normal.  Without conversational dyspnea, although satting in the high  80s during conversation Cardiovascular system: S1 & S2 heard, RRR. No pedal edema. Gastrointestinal system: Abdomen is nondistended, soft and nontender. Normal bowel sounds heard. Central nervous system: Alert and oriented. Non focal exam. Speech clear  Extremities: Symmetric in appearance bilaterally  Skin: No rashes, lesions or ulcers on exposed skin  Psychiatry: Judgement and insight appear stable. Mood & affect appropriate.   Data Reviewed: I have personally reviewed following labs and imaging studies  CBC: Recent Labs  Lab 06/25/20 0426 06/26/20 0408 06/27/20 0524 06/28/20 0542 06/29/20 0451  WBC 11.3* 11.5* 12.2* 13.0* 13.7*  NEUTROABS 9.7* 9.9* 10.5* 10.9* 11.6*  HGB 13.9 13.2 13.0 13.1 14.0  HCT 42.5 40.3 39.7 40.2 42.8  MCV 90.4 90.4 90.4 91.0 90.5  PLT 370 390 423* 485* 295*   Basic Metabolic Panel: Recent Labs  Lab 06/23/20 0455 06/23/20 0455 06/24/20 0415 06/24/20 0415 06/25/20 0426 06/26/20 0408 06/27/20 0524 06/28/20 0542 06/29/20 0753  NA 134*   < > 130*   < > 132* 133* 133* 132* 136  K 3.7   < > 3.7   < > 4.5 4.6 5.0 5.2* 5.5*  CL 102   < > 94*   < > 96* 97* 95* 95* 98  CO2 22   < > 24   < > 24 25 30 30 30   GLUCOSE 157*   < > 185*   < > 174* 177* 172* 162* 125*  BUN 13   < > 23   < > 26* 23 23 22 20   CREATININE 0.84   < > 0.95   < > 0.83 0.74 0.80 0.78 0.86  CALCIUM 7.4*   < > 8.4*   < > 8.4* 8.3* 8.4* 8.3* 8.9  MG 2.1  --  2.5*  --  2.5*  --   --   --   --   PHOS 2.9  --  3.5  --  3.7  --   --   --   --    < > = values in this interval not displayed.   GFR: Estimated Creatinine Clearance: 121.3 mL/min (by C-G formula based on SCr of 0.86 mg/dL). Liver Function Tests: Recent Labs  Lab 06/25/20 0426 06/26/20 0408 06/27/20 0524 06/28/20 0542 06/29/20 0753  AST 101* 78* 48* 31 29  ALT 87* 91* 83* 69* 60*  ALKPHOS 44 41 39 39 43  BILITOT 0.8 0.6 0.5 0.7 0.9  PROT 6.0* 5.8* 5.7* 5.7* 6.6  ALBUMIN 2.8* 2.7* 2.6* 2.7* 2.8*   No results for  input(s): LIPASE, AMYLASE in the last 168 hours. No results for input(s): AMMONIA in the last 168 hours. Coagulation Profile: No results for input(s): INR, PROTIME in the last 168 hours. Cardiac Enzymes: No results for input(s): CKTOTAL, CKMB, CKMBINDEX, TROPONINI in the last 168 hours. BNP (last 3 results) No results for input(s): PROBNP in the last 8760 hours. HbA1C: No results for input(s): HGBA1C in the last 72 hours. CBG: Recent  Labs  Lab 06/28/20 0725 06/28/20 1053 06/28/20 1626 06/28/20 1938 06/29/20 0722  GLUCAP 129* 241* 246* 245* 116*   Lipid Profile: No results for input(s): CHOL, HDL, LDLCALC, TRIG, CHOLHDL, LDLDIRECT in the last 72 hours. Thyroid Function Tests: No results for input(s): TSH, T4TOTAL, FREET4, T3FREE, THYROIDAB in the last 72 hours. Anemia Panel: Recent Labs    06/28/20 0542 06/29/20 0753  FERRITIN 162 175   Sepsis Labs: Recent Labs  Lab 06/22/20 1430 06/22/20 1547  PROCALCITON 0.20  --   LATICACIDVEN 1.7 1.7    Recent Results (from the past 240 hour(s))  SARS Coronavirus 2 by RT PCR (hospital order, performed in [facility] hospital lab) Nasopharyngeal Nasopharyngeal Swab     Status: Abnormal   Collection Time: 06/22/20  2:15 PM   Specimen: Nasopharyngeal Swab  Result Value Ref Range Status   SARS Coronavirus 2 POSITIVE (A) NEGATIVE Final    Comment: RESULT CALLED TO, READ BACK BY AND VERIFIED WITH: S.WEST,RN [id number] @1650  BY V.WILKINS (NOTE) SARS-CoV-2 target nucleic acids are DETECTED  SARS-CoV-2 RNA is generally detectable in upper respiratory specimens  during the acute phase of infection.  Positive results are indicative  of the presence of the identified virus, but do not rule out bacterial infection or co-infection with other pathogens not detected by the test.  Clinical correlation with patient history and  other diagnostic information is necessary to determine patient infection status.  The expected result is  negative.  Fact Sheet for Patients:   StrictlyIdeas.no   Fact Sheet for Healthcare Providers:   BankingDealers.co.za    This test is not yet approved or cleared by the [place] FDA and  has been authorized for detection and/or diagnosis of SARS-CoV-2 by FDA under an Emergency Use Authorization (EUA).  This EUA will remain in effect (meaning this t est can be used) for the duration of  the COVID-19 declaration under Section 564(b)(1) of the Act, 21 U.S.C. section 360-bbb-3(b)(1), unless the authorization is terminated or revoked sooner.  Performed at [facility], [place] [address] [place], [place] [address]   Blood Culture (routine x 2)     Status: None   Collection Time: 06/22/20  3:42 PM   Specimen: BLOOD LEFT FOREARM  Result Value Ref Range Status   Specimen Description   Final    BLOOD LEFT FOREARM Performed at [facility] [address], [place], [place] [address]    Special Requests   Final    BOTTLES DRAWN AEROBIC AND ANAEROBIC Blood Culture results may not be optimal due to an inadequate volume of blood received in culture bottles Performed at [facility] [address] [place], [place] [address]    Culture   Final    NO GROWTH 5 DAYS Performed at [facility], [place] [address] [place], [place] [address]    Report Status 06/27/2020 FINAL  Final  Blood Culture (routine x 2)     Status: None   Collection Time: 06/22/20  4:20 PM   Specimen: BLOOD  Result Value Ref Range Status   Specimen Description   Final    BLOOD SITE NOT SPECIFIED Performed at [facility] [address], [place], [place] [address]    Special Requests   Final    BOTTLES DRAWN AEROBIC AND ANAEROBIC Blood Culture results may not be optimal due to an inadequate volume of blood received in culture bottles Performed at [facility]  [address],  [place], [place] [address]    Culture   Final    NO GROWTH 5 DAYS Performed at [facility], [place] [address], [place], [place] [address]    Report Status 06/27/2020 FINAL  Final      Radiology Studies: No results found.    Scheduled Meds: . albuterol  2 puff Inhalation TID  . vitamin C  500 mg Oral Daily  . aspirin EC  81 mg Oral Daily  . baricitinib  4 mg Oral Daily  . cholecalciferol  1,000 Units Oral Daily  . enoxaparin (LOVENOX) injection  60 mg Subcutaneous Q24H  . escitalopram  20 mg Oral Daily  . feeding supplement (ENSURE ENLIVE)  237 mL Oral BID BM  . folic acid  1 mg Oral Daily  . insulin aspart  0-5 Units Subcutaneous QHS  . insulin aspart  0-9 Units Subcutaneous TID WC  . ipratropium  2 puff Inhalation TID  . methylPREDNISolone (SOLU-MEDROL) injection  60 mg Intravenous Q12H   Followed by  . [START ON 06/30/2020] predniSONE  50 mg Oral QAC breakfast  . multivitamin with minerals  1 tablet Oral Daily  . pantoprazole  40 mg Oral Daily  . senna-docusate  1 tablet Oral QHS  . zinc sulfate  220 mg Oral Daily   Continuous Infusions:    LOS: 7 days      Time spent: 40 minutes   Dessa Phi, DO [facility] 06/29/2020, 10:42 AM   Available via Epic secure chat 7am-7pm After these hours, please refer to coverage provider listed on [email or web address]

## 2020-06-29 NOTE — Progress Notes (Addendum)
Physical Therapy Treatment Patient Details Name: Bryan Kirby MRN: [id number] DOB: 1956/08/16 Today's Date: 06/29/2020    History of Present Illness 64 y.o. male with medical history significant for obesity, OSA on CPAP, chronic anxiety/depression, HLD, GERD, recent diagnosis of covid-19 viral infection on 06/15/20 at [facility], had monoclonal antibodies on 06/21/20 and felt worse afterwards who presented with worsening dyspnea of 2 days duration.  Associated with persistent non productive cough, bodys aches, chills, poor appetite and generalized weakness.    PT Comments    Pt progressing today, incr gait/activity tol. amb ~ 50' with simulated RW (O2 carrier).  SpO2=77-89% on 6L HFNC--however with poor perfusion therefore reading not accurate at times.  3/4DOE.  Recovery time 2-45minutes. Encouraged pt to continue OOB/ sit<>stands/IS, etc.  Follow Up Recommendations  Home health PT     Equipment Recommendations  Other (comment) (rollator-unless progresses to no needs)    Recommendations for Other Services       Precautions / Restrictions Precautions Precautions: Other (comment);Fall Precaution Comments: monitor O2 Restrictions Weight Bearing Restrictions: No    Mobility  Bed Mobility Overal bed mobility: Modified Independent                Transfers   Equipment used: Rolling walker (2 wheeled) Transfers: Sit to/from Stand Sit to Stand: Supervision         General transfer comment: for safety, cues to self monitor dyspnea  Ambulation/Gait Ambulation/Gait assistance: Supervision;Min guard Gait Distance (Feet): 50 Feet, 15' more  Assistive device: Rolling walker (2 wheeled) Gait Pattern/deviations: Step-through pattern;Decreased stride length     General Gait Details: supervision to min/guard, LOB x1 with slight scissoring, independent but delayed recovery   Stairs             Wheelchair Mobility    Modified Rankin (Stroke Patients Only)        Balance             Standing balance-Leahy Scale: Fair                              Cognition Arousal/Alertness: Awake/alert Behavior During Therapy: WFL for tasks assessed/performed Overall Cognitive Status: Within Functional Limits for tasks assessed                                 General Comments: Spouse is also here with COVID in room 1539; motivated to progress mobility      Exercises      General Comments        Pertinent Vitals/Pain Pain Assessment: No/denies pain    Home Living                      Prior Function            PT Goals (current goals can now be found in the care plan section) Acute Rehab PT Goals Patient Stated Goal: 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, community, go to church PT Goal Formulation: With patient Time For Goal Achievement: 07/08/20 Potential to Achieve Goals: Good Progress towards PT goals: Progressing toward goals    Frequency    Min 3X/week      PT Plan Current plan remains appropriate    Co-evaluation              AM-PAC PT "6 Clicks" Mobility   Outcome Measure  Help needed turning  from your back to your side while in a flat bed without using bedrails?: None Help needed moving from lying on your back to sitting on the side of a flat bed without using bedrails?: None Help needed moving to and from a bed to a chair (including a wheelchair)?: None Help needed standing up from a chair using your arms (e.g., wheelchair or bedside chair)?: None Help needed to walk in hospital room?: A Little Help needed climbing 3-5 steps with a railing? : A Little 6 Click Score: 22    End of Session Equipment Utilized During Treatment: Oxygen Activity Tolerance: Patient tolerated treatment well Patient left: in chair;with call bell/phone within reach   PT Visit Diagnosis: Difficulty in walking, not elsewhere classified (R26.2)     Time: 8453-6468 PT Time Calculation (min) (ACUTE ONLY): 15  min  Charges:  $Gait Training: 8-22 mins                     Baxter Flattery, PT  Acute Rehab Dept ([place]) [phone] Pager [phone]  06/29/2020    [facility] 06/29/2020, 11:02 AM

## 2020-06-30 LAB — COMPREHENSIVE METABOLIC PANEL
ALT: 58 U/L — ABNORMAL HIGH (ref 0–44)
AST: 25 U/L (ref 15–41)
Albumin: 2.8 g/dL — ABNORMAL LOW (ref 3.5–5.0)
Alkaline Phosphatase: 39 U/L (ref 38–126)
Anion gap: 10 (ref 5–15)
BUN: 22 mg/dL (ref 8–23)
CO2: 25 mmol/L (ref 22–32)
Calcium: 8.5 mg/dL — ABNORMAL LOW (ref 8.9–10.3)
Chloride: 97 mmol/L — ABNORMAL LOW (ref 98–111)
Creatinine, Ser: 0.75 mg/dL (ref 0.61–1.24)
GFR calc Af Amer: >60 mL/min (ref >60)
GFR calc non Af Amer: >60 mL/min (ref >60)
Glucose, Bld: 156 mg/dL — ABNORMAL HIGH (ref 70–99)
Potassium: 4.9 mmol/L (ref 3.5–5.1)
Sodium: 132 mmol/L — ABNORMAL LOW (ref 135–145)
Total Bilirubin: 0.5 mg/dL (ref 0.3–1.2)
Total Protein: 6.2 g/dL — ABNORMAL LOW (ref 6.5–8.1)

## 2020-06-30 LAB — GLUCOSE, CAPILLARY
Glucose-Capillary: 139 mg/dL — ABNORMAL HIGH (ref 70–99)
Glucose-Capillary: 190 mg/dL — ABNORMAL HIGH (ref 70–99)
Glucose-Capillary: 157 mg/dL — ABNORMAL HIGH (ref 70–99)
Glucose-Capillary: 272 mg/dL — ABNORMAL HIGH (ref 70–99)
Glucose-Capillary: 224 mg/dL — ABNORMAL HIGH (ref 70–99)

## 2020-06-30 LAB — CBC WITH DIFFERENTIAL/PLATELET
Abs Immature Granulocytes: 0.73 10*3/uL — ABNORMAL HIGH (ref 0.00–0.07)
Basophils Absolute: 0.1 10*3/uL (ref 0.0–0.1)
Basophils Relative: 0 %
Eosinophils Absolute: 0 10*3/uL (ref 0.0–0.5)
Eosinophils Relative: 0 %
HCT: 43.4 % (ref 39.0–52.0)
Hemoglobin: 14.2 g/dL (ref 13.0–17.0)
Immature Granulocytes: 5 %
Lymphocytes Relative: 4 %
Lymphs Abs: 0.5 10*3/uL — ABNORMAL LOW (ref 0.7–4.0)
MCH: 29.6 pg (ref 26.0–34.0)
MCHC: 32.7 g/dL (ref 30.0–36.0)
MCV: 90.6 fL (ref 80.0–100.0)
Monocytes Absolute: 1 10*3/uL (ref 0.1–1.0)
Monocytes Relative: 7 %
Neutro Abs: 12.4 10*3/uL — ABNORMAL HIGH (ref 1.7–7.7)
Neutrophils Relative %: 84 %
Platelets: 582 10*3/uL — ABNORMAL HIGH (ref 150–400)
RBC: 4.79 MIL/uL (ref 4.22–5.81)
RDW: 13.2 % (ref 11.5–15.5)
WBC: 14.7 10*3/uL — ABNORMAL HIGH (ref 4.0–10.5)
nRBC: 0 % (ref 0.0–0.2)

## 2020-06-30 LAB — C-REACTIVE PROTEIN: CRP: 0.5 mg/dL (ref <1.0)

## 2020-06-30 LAB — FERRITIN: Ferritin: 155 ng/mL (ref 24–336)

## 2020-06-30 LAB — D-DIMER, QUANTITATIVE: D-Dimer, Quant: 0.99 ug/mL-FEU — ABNORMAL HIGH (ref 0.00–0.50)

## 2020-06-30 MED ORDER — ALUM & MAG HYDROXIDE-SIMETH 200-200-20 MG/5ML PO SUSP: 30.0000 mL | Freq: Four times a day (QID) | ORAL | Status: DC | PRN

## 2020-06-30 MED ORDER — MAGNESIUM HYDROXIDE 400 MG/5ML PO SUSP
30.0000 mL | Freq: Once | ORAL | Status: AC
  Administered 2020-06-30: 30 mL via ORAL
  Filled 2020-06-30: qty 30

## 2020-06-30 NOTE — Progress Notes (Signed)
Nutrition Follow-up  DOCUMENTATION CODES:   Obesity unspecified  INTERVENTION:   -Ensure Enlive po BID, each supplement provides 350 kcal and 20 grams of protein -Magic cup BID with meals, each supplement provides 290 kcal and 9 grams of protein  NUTRITION DIAGNOSIS:   Increased nutrient needs related to acute illness (COVID-19 infection) as evidenced by estimated needs.  Ongoing.  GOAL:   Patient will meet greater than or equal to 90% of their needs  Progressing.  MONITOR:   PO intake, Supplement acceptance, Labs, Weight trends, I & O's  ASSESSMENT:   64 y.o. male with medical history significant for obesity, OSA on CPAP, chronic anxiety/depression, HLD, GERD, recent diagnosis of covid-19 viral infection on 06/15/20 at [facility], had monoclonal antibodies on 06/21/20 and felt worse afterwards who presented with worsening dyspnea of 2 days duration.  Associated with persistent non productive cough, bodys aches, chills, poor appetite and generalized weakness.   Patient has been positive for COVID-19 since 9/13.  Patient has been consuming 25-85% of meals. Pt is drinking Ensure supplements as well.   Admission weight: 124 lbs. No new weights for this admission.  Medications: Vitamin C, Vitamin D, Folic acid, Multivitamin with minerals daily, Miralax, Senokot, Zinc sulfate  Labs reviewed: CBGs: 157-190 Low Na  Diet Order:   Diet Order            Diet Heart Room service appropriate? Yes; Fluid consistency: Thin  Diet effective now                 EDUCATION NEEDS:   No education needs have been identified at this time  Skin:  Skin Assessment: Reviewed RN Assessment  Last BM:  9/21  Height:   Ht Readings from Last 1 Encounters:  06/22/20 5\' 11"  (1.803 m)    Weight:   Wt Readings from Last 1 Encounters:  06/22/20 124.7 kg   BMI:  Body mass index is 38.35 kg/m.  Estimated Nutritional Needs:   Kcal:  2400-2600  Protein:  110-125g  Fluid:   >2L/day  Clayton Bibles, MS, RD, LDN Inpatient Clinical Dietitian Contact information available via Amion

## 2020-06-30 NOTE — Progress Notes (Addendum)
PROGRESS NOTE    [name]  TTS:[id number] DOB: 1955-12-07 DOA: 06/22/2020 PCP: Owens Loffler, MD     Brief Narrative:  Bryan Kirby is a 64 y.o.malewith medical history significant forobesity, OSAon CPAP, chronic anxiety/depression, HLD, GERD, recent diagnosis of Covid-19 viral infection on 06/15/20 at [facility], had monoclonal antibodies on 9/19/21and felt worse afterwardswho presented with worsening dyspnea of 2 days duration. Associated with persistent non productive cough,bodys aches, chills, poor appetite and generalized weakness.Had diarrhea for 4 days prior to his first diagnosis of Covid-19 infection, GI symptoms have now resolved. Admits to pleuritic chest pain, worse when he coughs. Denies lower extremities edema or tenderness. At home hisoxygen saturation was 86% on room air. Concerned about his hypoxia he decided to come to the ED for further evaluation and management.   New events last 24 hours / Subjective: No new complaints this morning.  Worked with physical therapy yesterday with desaturations with activity.  He remains on 5 L O2 and satting in the high 80s at rest during my examination.  Assessment & Plan:   Active Problems:   Pneumonia due to COVID-19 virus   Acute hypoxemic respiratory failure secondary to COVID-19 -Remains on 5L nasal cannula O2, wean as able  COVID-19 -Tested positive 9/13 and again 9/20 -Received monoclonal antibodies 9/19 -Completed Remdesivir 9/20-9/24 -Continue baricitinib 9/20 >> -Continue steroids 9/20 >>    COVID-19 Labs Recent Labs    06/28/20 0542 06/29/20 0451 06/29/20 0753 06/30/20 0405 06/30/20 0428  DDIMER 1.00* 1.02*  --   --  0.99*  FERRITIN 162  --  175 155  --   CRP 0.6 <0.50  --  0.5  --    Weakness -PT recommends home health  Obesity -Estimated body mass index is 38.35 kg/m as calculated from the following:   Height as of this encounter: 5\' 11"  (1.803 m).   Weight as of this  encounter: 124.7 kg.  OSA -CPAP nightly  Depression/anxiety -Continue Lexapro   DVT prophylaxis: Lovenox  Code Status: Partial code, DNI Family Communication: No family at bedside, wife is admitted to the hospital as well with Covid  Disposition Plan:  Status is: Inpatient  Remains inpatient appropriate because:Inpatient level of care appropriate due to severity of illness   Dispo:  Patient From: Home  Planned Disposition: Home  Expected discharge date: 1 day   Medically stable for discharge: No remains on high flow nasal cannula O2 this morning, continue to wean as tolerated. Hopeful discharge home 9/29 with goal of oxygen requirement < 4L     Consultants:   None  Procedures:   None  Antimicrobials:  Anti-infectives (From admission, onward)   Start     Dose/Rate Route Frequency Ordered Stop   06/23/20 1000  remdesivir 100 mg in sodium chloride 0.9 % 100 mL IVPB       "Followed by" Linked Group Details   100 mg 200 mL/hr over 30 Minutes Intravenous Daily 06/22/20 1353 06/26/20 1015   06/22/20 1530  remdesivir 200 mg in sodium chloride 0.9% 250 mL IVPB       "Followed by" Linked Group Details   200 mg 580 mL/hr over 30 Minutes Intravenous Once 06/22/20 1353 06/22/20 2138       Objective: Vitals:   06/29/20 0555 06/29/20 1348 06/29/20 2016 06/30/20 0459  BP: (!) 141/90 117/68 136/82 124/85  Pulse: (!) 58 85 67 60  Resp: 18 18 18 18   Temp: 97.7 F (36.5 C) 97.6 F (36.4 C)  97.8 F (36.6 C) 98 F (36.7 C)  TempSrc: Oral Oral Axillary   SpO2: 94% 94% 97% 97%  Weight:      Height:        Intake/Output Summary (Last 24 hours) at 06/30/2020 1235 Last data filed at 06/30/2020 1215 Gross per 24 hour  Intake 356 ml  Output 2150 ml  Net -1794 ml   Filed Weights   06/22/20 1313  Weight: 124.7 kg   Examination: General exam: Appears calm and comfortable  Respiratory system: Clear to auscultation. Respiratory effort normal.  Without  distress Cardiovascular system: S1 & S2 heard, RRR. No pedal edema. Gastrointestinal system: Abdomen is nondistended, soft and nontender. Normal bowel sounds heard. Central nervous system: Alert and oriented. Non focal exam. Speech clear  Extremities: Symmetric in appearance bilaterally  Skin: No rashes, lesions or ulcers on exposed skin  Psychiatry: Judgement and insight appear stable. Mood & affect appropriate.    Data Reviewed: I have personally reviewed following labs and imaging studies  CBC: Recent Labs  Lab 06/26/20 0408 06/27/20 0524 06/28/20 0542 06/29/20 0451 06/30/20 0428  WBC 11.5* 12.2* 13.0* 13.7* 14.7*  NEUTROABS 9.9* 10.5* 10.9* 11.6* 12.4*  HGB 13.2 13.0 13.1 14.0 14.2  HCT 40.3 39.7 40.2 42.8 43.4  MCV 90.4 90.4 91.0 90.5 90.6  PLT 390 423* 485* 426* 366*   Basic Metabolic Panel: Recent Labs  Lab 06/24/20 0415 06/24/20 0415 06/25/20 0426 06/25/20 0426 06/26/20 0408 06/27/20 0524 06/28/20 0542 06/29/20 0753 06/30/20 0428  NA 130*   < > 132*   < > 133* 133* 132* 136 132*  K 3.7   < > 4.5   < > 4.6 5.0 5.2* 5.5* 4.9  CL 94*   < > 96*   < > 97* 95* 95* 98 97*  CO2 24   < > 24   < > 25 30 30 30 25   GLUCOSE 185*   < > 174*   < > 177* 172* 162* 125* 156*  BUN 23   < > 26*   < > 23 23 22 20 22   CREATININE 0.95   < > 0.83   < > 0.74 0.80 0.78 0.86 0.75  CALCIUM 8.4*   < > 8.4*   < > 8.3* 8.4* 8.3* 8.9 8.5*  MG 2.5*  --  2.5*  --   --   --   --   --   --   PHOS 3.5  --  3.7  --   --   --   --   --   --    < > = values in this interval not displayed.   GFR: Estimated Creatinine Clearance: 130.4 mL/min (by C-G formula based on SCr of 0.75 mg/dL). Liver Function Tests: Recent Labs  Lab 06/26/20 0408 06/27/20 0524 06/28/20 0542 06/29/20 0753 06/30/20 0428  AST 78* 48* 31 29 25   ALT 91* 83* 69* 60* 58*  ALKPHOS 41 39 39 43 39  BILITOT 0.6 0.5 0.7 0.9 0.5  PROT 5.8* 5.7* 5.7* 6.6 6.2*  ALBUMIN 2.7* 2.6* 2.7* 2.8* 2.8*   No results for input(s):  LIPASE, AMYLASE in the last 168 hours. No results for input(s): AMMONIA in the last 168 hours. Coagulation Profile: No results for input(s): INR, PROTIME in the last 168 hours. Cardiac Enzymes: No results for input(s): CKTOTAL, CKMB, CKMBINDEX, TROPONINI in the last 168 hours. BNP (last 3 results) No results for input(s): PROBNP in the last 8760 hours. HbA1C: No results  for input(s): HGBA1C in the last 72 hours. CBG: Recent Labs  Lab 06/29/20 1607 06/29/20 2013 06/30/20 0742 06/30/20 1030 06/30/20 1149  GLUCAP 290* 289* 139* 190* 157*   Lipid Profile: No results for input(s): CHOL, HDL, LDLCALC, TRIG, CHOLHDL, LDLDIRECT in the last 72 hours. Thyroid Function Tests: No results for input(s): TSH, T4TOTAL, FREET4, T3FREE, THYROIDAB in the last 72 hours. Anemia Panel: Recent Labs    06/29/20 0753 06/30/20 0405  FERRITIN 175 155   Sepsis Labs: No results for input(s): PROCALCITON, LATICACIDVEN in the last 168 hours.  Recent Results (from the past 240 hour(s))  SARS Coronavirus 2 by RT PCR (hospital order, performed in [facility] hospital lab) Nasopharyngeal Nasopharyngeal Swab     Status: Abnormal   Collection Time: 06/22/20  2:15 PM   Specimen: Nasopharyngeal Swab  Result Value Ref Range Status   SARS Coronavirus 2 POSITIVE (A) NEGATIVE Final    Comment: RESULT CALLED TO, READ BACK BY AND VERIFIED WITH: S.WEST,RN [id number] @1650  BY V.WILKINS (NOTE) SARS-CoV-2 target nucleic acids are DETECTED  SARS-CoV-2 RNA is generally detectable in upper respiratory specimens  during the acute phase of infection.  Positive results are indicative  of the presence of the identified virus, but do not rule out bacterial infection or co-infection with other pathogens not detected by the test.  Clinical correlation with patient history and  other diagnostic information is necessary to determine patient infection status.  The expected result is negative.  Fact Sheet for Patients:    StrictlyIdeas.no   Fact Sheet for Healthcare Providers:   BankingDealers.co.za    This test is not yet approved or cleared by the [place] FDA and  has been authorized for detection and/or diagnosis of SARS-CoV-2 by FDA under an Emergency Use Authorization (EUA).  This EUA will remain in effect (meaning this t est can be used) for the duration of  the COVID-19 declaration under Section 564(b)(1) of the Act, 21 U.S.C. section 360-bbb-3(b)(1), unless the authorization is terminated or revoked sooner.  Performed at [facility], [place] [address] [place], [place] [address]   Blood Culture (routine x 2)     Status: None   Collection Time: 06/22/20  3:42 PM   Specimen: BLOOD LEFT FOREARM  Result Value Ref Range Status   Specimen Description   Final    BLOOD LEFT FOREARM Performed at [facility] [address] [place], [place] [address]    Special Requests   Final    BOTTLES DRAWN AEROBIC AND ANAEROBIC Blood Culture results may not be optimal due to an inadequate volume of blood received in culture bottles Performed at [facility] [address] [place], [place] [address]    Culture   Final    NO GROWTH 5 DAYS Performed at [facility], [place] [address] [place], [place] [address]    Report Status 06/27/2020 FINAL  Final  Blood Culture (routine x 2)     Status: None   Collection Time: 06/22/20  4:20 PM   Specimen: BLOOD  Result Value Ref Range Status   Specimen Description   Final    BLOOD SITE NOT SPECIFIED Performed at [facility] [address], [place], [place] [address]    Special Requests   Final    BOTTLES DRAWN AEROBIC AND ANAEROBIC Blood Culture results may not be optimal due to an inadequate volume of blood received in culture bottles Performed at [facility], [address] [place], [place] [address]    Culture   Final     NO GROWTH 5 DAYS Performed at [facility], [place] [address] [place], [place] [address]    Report Status 06/27/2020 FINAL  Final      Radiology Studies: No results found.    Scheduled Meds: . albuterol  2 puff Inhalation TID  . vitamin C  500 mg Oral Daily  . aspirin EC  81 mg Oral Daily  . baricitinib  4 mg Oral Daily  . cholecalciferol  1,000 Units Oral Daily  . enoxaparin (LOVENOX) injection  60 mg Subcutaneous Q24H  . escitalopram  20 mg Oral Daily  . feeding supplement (ENSURE ENLIVE)  237 mL Oral BID BM  . folic acid  1 mg Oral Daily  . insulin aspart  0-5 Units Subcutaneous QHS  . insulin aspart  0-9 Units Subcutaneous TID WC  . ipratropium  2 puff Inhalation TID  . multivitamin with minerals  1 tablet Oral Daily  . pantoprazole  40 mg Oral Daily  . polyethylene glycol  17 g Oral Daily  . predniSONE  50 mg Oral QAC breakfast  . senna-docusate  1 tablet Oral QHS  . zinc sulfate  220 mg Oral Daily   Continuous Infusions:    LOS: 8 days      Time spent: 40 minutes   Dessa Phi, DO [facility] 06/30/2020, 12:35 PM   Available via Epic secure chat 7am-7pm After these hours, please refer to coverage provider listed on [email or web address]

## 2020-06-30 NOTE — Progress Notes (Signed)
SATURATION QUALIFICATIONS: (This note is used to comply with regulatory documentation for home oxygen)  Patient Saturations on Room Air at Rest = 89%  Patient Saturations on Room Air while Ambulating = 77%  Patient Saturations on 4 Liters of oxygen while Ambulating = 90%  Please briefly explain why patient needs home oxygen: Patient oxygen saturation drop with exertion w/o oxygen. Therefore, patient will need supplemental oxygen when home.

## 2020-07-01 ENCOUNTER — Telehealth: Payer: Self-pay | Admitting: *Deleted

## 2020-07-01 DIAGNOSIS — J9601 Acute respiratory failure with hypoxia: Secondary | ICD-10-CM

## 2020-07-01 LAB — CBC WITH DIFFERENTIAL/PLATELET
Abs Immature Granulocytes: 0.48 10*3/uL — ABNORMAL HIGH (ref 0.00–0.07)
Basophils Absolute: 0 10*3/uL (ref 0.0–0.1)
Basophils Relative: 0 %
Eosinophils Absolute: 0 10*3/uL (ref 0.0–0.5)
Eosinophils Relative: 0 %
HCT: 39.1 % (ref 39.0–52.0)
Hemoglobin: 12.9 g/dL — ABNORMAL LOW (ref 13.0–17.0)
Immature Granulocytes: 4 %
Lymphocytes Relative: 7 %
Lymphs Abs: 0.8 10*3/uL (ref 0.7–4.0)
MCH: 29.7 pg (ref 26.0–34.0)
MCHC: 33 g/dL (ref 30.0–36.0)
MCV: 90.1 fL (ref 80.0–100.0)
Monocytes Absolute: 1 10*3/uL (ref 0.1–1.0)
Monocytes Relative: 8 %
Neutro Abs: 9.7 10*3/uL — ABNORMAL HIGH (ref 1.7–7.7)
Neutrophils Relative %: 81 %
Platelets: 422 10*3/uL — ABNORMAL HIGH (ref 150–400)
RBC: 4.34 MIL/uL (ref 4.22–5.81)
RDW: 13.2 % (ref 11.5–15.5)
WBC: 12 10*3/uL — ABNORMAL HIGH (ref 4.0–10.5)
nRBC: 0 % (ref 0.0–0.2)

## 2020-07-01 LAB — COMPREHENSIVE METABOLIC PANEL
ALT: 64 U/L — ABNORMAL HIGH (ref 0–44)
AST: 29 U/L (ref 15–41)
Albumin: 2.6 g/dL — ABNORMAL LOW (ref 3.5–5.0)
Alkaline Phosphatase: 41 U/L (ref 38–126)
Anion gap: 8 (ref 5–15)
BUN: 24 mg/dL — ABNORMAL HIGH (ref 8–23)
CO2: 30 mmol/L (ref 22–32)
Calcium: 8.4 mg/dL — ABNORMAL LOW (ref 8.9–10.3)
Chloride: 96 mmol/L — ABNORMAL LOW (ref 98–111)
Creatinine, Ser: 0.9 mg/dL (ref 0.61–1.24)
GFR calc Af Amer: >60 mL/min (ref >60)
GFR calc non Af Amer: >60 mL/min (ref >60)
Glucose, Bld: 138 mg/dL — ABNORMAL HIGH (ref 70–99)
Potassium: 4.7 mmol/L (ref 3.5–5.1)
Sodium: 134 mmol/L — ABNORMAL LOW (ref 135–145)
Total Bilirubin: 0.7 mg/dL (ref 0.3–1.2)
Total Protein: 5.6 g/dL — ABNORMAL LOW (ref 6.5–8.1)

## 2020-07-01 LAB — GLUCOSE, CAPILLARY
Glucose-Capillary: 86 mg/dL (ref 70–99)
Glucose-Capillary: 162 mg/dL — ABNORMAL HIGH (ref 70–99)

## 2020-07-01 LAB — D-DIMER, QUANTITATIVE: D-Dimer, Quant: 1.19 ug/mL-FEU — ABNORMAL HIGH (ref 0.00–0.50)

## 2020-07-01 LAB — C-REACTIVE PROTEIN: CRP: 0.6 mg/dL (ref <1.0)

## 2020-07-01 LAB — FERRITIN: Ferritin: 155 ng/mL (ref 24–336)

## 2020-07-01 MED ORDER — IPRATROPIUM BROMIDE HFA 17 MCG/ACT IN AERS: 2.0000 | INHALATION_SPRAY | Freq: Four times a day (QID) | RESPIRATORY_TRACT | Status: DC | PRN

## 2020-07-01 MED ORDER — GUAIFENESIN-DM 100-10 MG/5ML PO SYRP: 10.0000 mL | ORAL_SOLUTION | ORAL | 0 refills | Status: DC | PRN

## 2020-07-01 MED ORDER — VITAMIN D3 25 MCG PO TABS: 1000.0000 [IU] | ORAL_TABLET | Freq: Every day | ORAL | 0 refills | Status: DC

## 2020-07-01 MED ORDER — ALBUTEROL SULFATE HFA 108 (90 BASE) MCG/ACT IN AERS: 2.0000 | INHALATION_SPRAY | RESPIRATORY_TRACT | 0 refills | Status: DC | PRN

## 2020-07-01 MED ORDER — FOLIC ACID 1 MG PO TABS
1.0000 mg | ORAL_TABLET | Freq: Every day | ORAL | 0 refills | Status: DC
Start: 2020-07-02 — End: 2021-01-11

## 2020-07-01 MED ORDER — IPRATROPIUM BROMIDE HFA 17 MCG/ACT IN AERS: 2.0000 | INHALATION_SPRAY | Freq: Four times a day (QID) | RESPIRATORY_TRACT | 0 refills | Status: DC | PRN

## 2020-07-01 MED ORDER — MELATONIN 3 MG PO TABS
3.0000 mg | ORAL_TABLET | Freq: Every evening | ORAL | 0 refills | Status: DC | PRN
Start: 2020-07-01 — End: 2020-07-14

## 2020-07-01 MED ORDER — ZINC SULFATE 220 (50 ZN) MG PO CAPS: 220.0000 mg | ORAL_CAPSULE | Freq: Every day | ORAL | 0 refills | Status: DC

## 2020-07-01 MED ORDER — PHENOL 1.4 % MT LIQD: 1.0000 | OROMUCOSAL | 0 refills | Status: DC | PRN

## 2020-07-01 MED ORDER — HYDROCOD POLST-CPM POLST ER 10-8 MG/5ML PO SUER
5.0000 mL | Freq: Two times a day (BID) | ORAL | 0 refills | Status: DC | PRN
Start: 2020-07-01 — End: 2020-07-14

## 2020-07-01 MED ORDER — ALBUTEROL SULFATE HFA 108 (90 BASE) MCG/ACT IN AERS: 2.0000 | INHALATION_SPRAY | RESPIRATORY_TRACT | Status: DC | PRN

## 2020-07-01 MED ORDER — ASCORBIC ACID 500 MG PO TABS
500.0000 mg | ORAL_TABLET | Freq: Every day | ORAL | 0 refills | Status: DC
Start: 2020-07-02 — End: 2020-07-14

## 2020-07-01 MED ORDER — PREDNISONE 50 MG PO TABS
50.0000 mg | ORAL_TABLET | Freq: Every day | ORAL | 0 refills | Status: DC
Start: 2020-07-02 — End: 2020-07-14

## 2020-07-01 NOTE — Telephone Encounter (Signed)
Patient called stating that he was just released from the hospital because he had covid. Patient stated that he was advised when he was discharged that he needed to get some prescriptions filled. Patient stated that he was calling to get Dr. Lorelei Pont to fill the scripts that are on is discharge summary. Patient was advised that he should have the scripts printed out  from the hospital doctor in his paperwork. Patient went thru his paperwork and found the printed scripts.Patient stated that no one at the hospital told him that he had them in his paperwork to get filled.  Patient will have someone take them to the pharmacy to get them filled now.

## 2020-07-01 NOTE — Progress Notes (Signed)
Went over discharge instructions w/ pt.  

## 2020-07-01 NOTE — TOC Transition Note (Signed)
Transition of Care [facility]) - CM/SW Discharge Note   Patient Details  Name: Bryan Kirby MRN: [id number] Date of Birth: Jan 27, 1956  Transition of Care [facility] CM/SW Contact:  Trish Mage, LCSW Phone Number: 07/01/2020, 10:37 AM   Clinical Narrative:   Patient who is scheduled to d/c today is in need of O2, has a ride home. His wife just d/ced yesterday, and they have support from their church family.  He declined a walker-says he has access to one, declined HH PT, saying he feels he will be OK without. Note and orders seen and appreciated. Contacted Zach with [employer] who will arrange for delivery of travel cannister for ride home, home unit. No further needs identified.  TOC sign off.      Barriers to Discharge: No Barriers Identified   Patient Goals and CMS Choice        Discharge Placement                       Discharge Plan and Services                                     Social Determinants of Health (SDOH) Interventions     Readmission Risk Interventions No flowsheet data found.

## 2020-07-01 NOTE — Discharge Summary (Signed)
Physician Discharge Summary  Bryan Kirby ZOX:[id number] DOB: 11-23-1955 DOA: 06/22/2020  PCP: Owens Loffler, MD  Admit date: 06/22/2020 Discharge date: 07/01/2020  Time spent: 35 minutes  Recommendations for Outpatient Follow-up:   Acute hypoxemic respiratory failure secondary to COVID-19 -Remains on 5L nasal cannula O2, wean as able  COVID-19 -Tested positive 9/13 and again 9/20 -Received monoclonal antibodies 9/19 -Completed Remdesivir 9/20-9/24 -Continue Baricitinib 9/20 >> 9/29 -Continue Steroids 9/20 >>  9/30  SATURATION QUALIFICATIONS: (This note is used to comply with regulatory documentation for home oxygen) Patient Saturations on Room Air at Rest = 89% Patient Saturations on Room Air while Ambulating = 77% Patient Saturations on 4 Liters of oxygen while Ambulating = 90% Please briefly explain why patient needs home oxygen: Patient oxygen saturation drop with exertion w/o oxygen. Therefore, patient will need supplemental oxygen when home.  -Patient meets criteria for home O2 -4 L O2 titrate to maintain SPO2> 88% -Provide patient with Inogen home O2 portable concentrator -Patient will be considered contagious until 10/11 and therefore should follow the below recommendations for his own safety as well as his family's. -Follow-up appointment with Dr. Erin Fulling on 18 October@1100  [facility] in [place]  Discharge Diagnoses:  Active Problems:   Pneumonia due to COVID-19 virus   Discharge Condition: Stable  Diet recommendation: Heart healthy  Filed Weights   06/22/20 1313  Weight: 124.7 kg    History of present illness:  Bryan Kirby is a 64 y.o.WM PMHx obesity, OSAon CPAP, chronic anxiety/depression, HLD, GERD, recent diagnosis of Covid-19 viral infection on 06/15/20 at [facility], had monoclonal antibodies on 9/19/21and felt worse afterwardswho presented with worsening dyspnea of 2 days duration. Associated with persistent non productive  cough,bodys aches, chills, poor appetite and generalized weakness.Had diarrhea for 4 days prior to his first diagnosis of Covid-19 infection, GI symptoms have now resolved. Admits to pleuritic chest pain, worse when he coughs. Denies lower extremities edema or tenderness. At home hisoxygen saturation was 86% on room air. Concerned about his hypoxia he decided to come to the ED for further evaluation and management.   Hospital Course:  See above    Cultures   9/20 SARS coronavirus positive  9/20 blood negative final x2   Antibiotics Anti-infectives (From admission, onward)   Start     Ordered Stop   06/23/20 1000  remdesivir 100 mg in sodium chloride 0.9 % 100 mL IVPB       "Followed by" Linked Group Details   06/22/20 1353 06/26/20 1015   06/22/20 1530  remdesivir 200 mg in sodium chloride 0.9% 250 mL IVPB       "Followed by" Linked Group Details   06/22/20 1353 06/22/20 2138       Discharge Exam: Vitals:   06/30/20 0459 06/30/20 1412 06/30/20 2044 07/01/20 0346  BP: 124/85 135/77 131/72 (!) 141/81  Pulse: 60 84 78 60  Resp: 18 20 15 15   Temp: 98 F (36.7 C) 97.7 F (36.5 C) 98.4 F (36.9 C) 97.7 F (36.5 C)  TempSrc:  Oral Oral Oral  SpO2: 97% (!) 88% 95% 98%  Weight:      Height:        Physical Exam:  General: A/O x4, positive acute respiratory distress Eyes: negative scleral hemorrhage, negative anisocoria, negative icterus ENT: Negative Runny nose, negative gingival bleeding, Neck:  Negative scars, masses, torticollis, lymphadenopathy, JVD Lungs: decreased breath sounds bilaterally without wheezes or crackles Cardiovascular: Regular rate and rhythm without murmur gallop or  rub normal S1 and S2  Discharge Instructions  Discharge Instructions    Diet - low sodium heart healthy   Complete by: As directed    Discharge instructions   Complete by: As directed    ?   Person Under Monitoring Name: Bryan Kirby  Location: [address] [address]   Infection Prevention Recommendations for Individuals Confirmed to have, or Being Evaluated for, 2019 Novel Coronavirus (COVID-19) Infection Who Receive Care at Home  Individuals who are confirmed to have, or are being evaluated for, COVID-19 should follow the prevention steps below until a healthcare provider or local or state health department says they can return to normal activities.  Stay home except to get medical care You should restrict activities outside your home, except for getting medical care. Do not go to work, school, or public areas, and do not use public transportation or taxis.  Call ahead before visiting your doctor Before your medical appointment, call the healthcare provider and tell them that you have, or are being evaluated for, COVID-19 infection. This will help the healthcare provider's office take steps to keep other people from getting infected. Ask your healthcare provider to call the local or state health department.  Monitor your symptoms Seek prompt medical attention if your illness is worsening (e.g., difficulty breathing). Before going to your medical appointment, call the healthcare provider and tell them that you have, or are being evaluated for, COVID-19 infection. Ask your healthcare provider to call the local or state health department.  Wear a facemask You should wear a facemask that covers your nose and mouth when you are in the same room with other people and when you visit a healthcare provider. People who live with or visit you should also wear a facemask while they are in the same room with you.  Separate yourself from other people in your home As much as possible, you should stay in a different room from other people in your home. Also, you should use a separate bathroom, if available.  Avoid sharing household items You should not share dishes, drinking glasses, cups, eating utensils, towels, bedding, or other  items with other people in your home. After using these items, you should wash them thoroughly with soap and water.  Cover your coughs and sneezes Cover your mouth and nose with a tissue when you cough or sneeze, or you can cough or sneeze into your sleeve. Throw used tissues in a lined trash can, and immediately wash your hands with soap and water for at least 20 seconds or use an alcohol-based hand rub.  Wash your [facility] your hands often and thoroughly with soap and water for at least 20 seconds. You can use an alcohol-based hand sanitizer if soap and water are not available and if your hands are not visibly dirty. Avoid touching your eyes, nose, and mouth with unwashed hands.   Prevention Steps for Caregivers and Household Members of Individuals Confirmed to have, or Being Evaluated for, COVID-19 Infection Being Cared for in the Home  If you live with, or provide care at home for, a person confirmed to have, or being evaluated for, COVID-19 infection please follow these guidelines to prevent infection:  Follow healthcare provider's instructions Make sure that you understand and can help the patient follow any healthcare provider instructions for all care.  Provide for the patient's basic needs You should help the patient with basic needs in the home and provide support for getting  groceries, prescriptions, and other personal needs.  Monitor the patient's symptoms If they are getting sicker, call his or her medical provider and tell them that the patient has, or is being evaluated for, COVID-19 infection. This will help the healthcare provider's office take steps to keep other people from getting infected. Ask the healthcare provider to call the local or state health department.  Limit the number of people who have contact with the patient If possible, have only one caregiver for the patient. Other household members should stay in another home or place of residence. If this is  not possible, they should stay in another room, or be separated from the patient as much as possible. Use a separate bathroom, if available. Restrict visitors who do not have an essential need to be in the home.  Keep older adults, very young children, and other sick people away from the patient Keep older adults, very young children, and those who have compromised immune systems or chronic health conditions away from the patient. This includes people with chronic heart, lung, or kidney conditions, diabetes, and cancer.  Ensure good ventilation Make sure that shared spaces in the home have good air flow, such as from an air conditioner or an opened window, weather permitting.  Wash your hands often Wash your hands often and thoroughly with soap and water for at least 20 seconds. You can use an alcohol based hand sanitizer if soap and water are not available and if your hands are not visibly dirty. Avoid touching your eyes, nose, and mouth with unwashed hands. Use disposable paper towels to dry your hands. If not available, use dedicated cloth towels and replace them when they become wet.  Wear a facemask and gloves Wear a disposable facemask at all times in the room and gloves when you touch or have contact with the patient's blood, body fluids, and/or secretions or excretions, such as sweat, saliva, sputum, nasal mucus, vomit, urine, or feces.  Ensure the mask fits over your nose and mouth tightly, and do not touch it during use. Throw out disposable facemasks and gloves after using them. Do not reuse. Wash your hands immediately after removing your facemask and gloves. If your personal clothing becomes contaminated, carefully remove clothing and launder. Wash your hands after handling contaminated clothing. Place all used disposable facemasks, gloves, and other waste in a lined container before disposing them with other household waste. Remove gloves and wash your hands immediately after  handling these items.  Do not share dishes, glasses, or other household items with the patient Avoid sharing household items. You should not share dishes, drinking glasses, cups, eating utensils, towels, bedding, or other items with a patient who is confirmed to have, or being evaluated for, COVID-19 infection. After the person uses these items, you should wash them thoroughly with soap and water.  Wash laundry thoroughly Immediately remove and wash clothes or bedding that have blood, body fluids, and/or secretions or excretions, such as sweat, saliva, sputum, nasal mucus, vomit, urine, or feces, on them. Wear gloves when handling laundry from the patient. Read and follow directions on labels of laundry or clothing items and detergent. In general, wash and dry with the warmest temperatures recommended on the label.  Clean all areas the individual has used often Clean all touchable surfaces, such as counters, tabletops, doorknobs, bathroom fixtures, toilets, phones, keyboards, tablets, and bedside tables, every day. Also, clean any surfaces that may have blood, body fluids, and/or secretions or excretions on them. Wear  gloves when cleaning surfaces the patient has come in contact with. Use a diluted bleach solution (e.g., dilute bleach with 1 part bleach and 10 parts water) or a household disinfectant with a label that says EPA-registered for coronaviruses. To make a bleach solution at home, add 1 tablespoon of bleach to 1 quart (4 cups) of water. For a larger supply, add  cup of bleach to 1 gallon (16 cups) of water. Read labels of cleaning products and follow recommendations provided on product labels. Labels contain instructions for safe and effective use of the cleaning product including precautions you should take when applying the product, such as wearing gloves or eye protection and making sure you have good ventilation during use of the product. Remove gloves and wash hands immediately after  cleaning.  Monitor yourself for signs and symptoms of illness Caregivers and household members are considered close contacts, should monitor their health, and will be asked to limit movement outside of the home to the extent possible. Follow the monitoring steps for close contacts listed on the symptom monitoring form.   ? If you have additional questions, contact your local health department or call the epidemiologist on call at [phone] (available 24/7). ? This guidance is subject to change. For the most up-to-date guidance from [facility], please refer to their website: YouBlogs.pl   Increase activity slowly   Complete by: As directed      Allergies as of 07/01/2020      Reactions   Codeine    REACTION: itching   Penicillins    REACTION: UNSPECIFIED      Medication List    TAKE these medications   albuterol 108 (90 Base) MCG/ACT inhaler Commonly known as: VENTOLIN HFA Inhale 2 puffs into the lungs every 4 (four) hours as needed for wheezing or shortness of breath.   ascorbic acid 500 MG tablet Commonly known as: VITAMIN C Take 1 tablet (500 mg total) by mouth daily. Start taking on: July 02, 2020   aspirin 81 MG tablet Take 81 mg by mouth daily.   chlorpheniramine-HYDROcodone 10-8 MG/5ML Suer Commonly known as: TUSSIONEX Take 5 mLs by mouth every 12 (twelve) hours as needed for cough.   diclofenac sodium 1 % Gel Commonly known as: VOLTAREN Apply 4 g topically 4 (four) times daily as needed. Apply to large joint PRN   escitalopram 20 MG tablet Commonly known as: LEXAPRO TAKE ONE TABLET BY MOUTH DAILY   folic acid 1 MG tablet Commonly known as: FOLVITE Take 1 tablet (1 mg total) by mouth daily. Start taking on: July 02, 2020   guaiFENesin-dextromethorphan 100-10 MG/5ML syrup Commonly known as: ROBITUSSIN DM Take 10 mLs by mouth every 4 (four) hours as needed for cough.   ipratropium 17  MCG/ACT inhaler Commonly known as: ATROVENT HFA Inhale 2 puffs into the lungs every 6 (six) hours as needed (shortness of breath).   melatonin 3 MG Tabs tablet Take 1 tablet (3 mg total) by mouth at bedtime as needed (sleep).   ondansetron 4 MG disintegrating tablet Commonly known as: Zofran ODT Take 1 tablet (4 mg total) by mouth every 8 (eight) hours as needed for nausea or vomiting.   pantoprazole 40 MG tablet Commonly known as: PROTONIX TAKE ONE TABLET BY MOUTH DAILY   phenol 1.4 % Liqd Commonly known as: CHLORASEPTIC Use as directed 1 spray in the mouth or throat as needed for throat irritation / pain.   predniSONE 50 MG tablet Commonly known as: DELTASONE Take 1 tablet (  50 mg total) by mouth daily before breakfast. Start taking on: July 02, 2020   simvastatin 40 MG tablet Commonly known as: ZOCOR TAKE ONE TABLET BY MOUTH AT BEDTIME   TYLENOL 8 HOUR PO Take 1,000 mg by mouth once as needed (pain).   Vitamin D3 25 MCG tablet Commonly known as: Vitamin D Take 1 tablet (1,000 Units total) by mouth daily. Start taking on: July 02, 2020   zinc sulfate 220 (50 Zn) MG capsule Take 1 capsule (220 mg total) by mouth daily. Start taking on: July 02, 2020            Durable Medical Equipment  (From admission, onward)         Start     Ordered   07/01/20 0951  For home use only DME oxygen  Once       Comments: SATURATION QUALIFICATIONS: (This note is used to comply with regulatory documentation for home oxygen) Patient Saturations on Room Air at Rest = 89% Patient Saturations on Room Air while Ambulating = 77% Patient Saturations on 4 Liters of oxygen while Ambulating = 90% Please briefly explain why patient needs home oxygen: Patient oxygen saturation drop with exertion w/o oxygen. Therefore, patient will need supplemental oxygen when home.  -Patient meets criteria for home O2 -4 L O2 titrate to maintain SPO2> 88% -Provide patient with Inogen home  O2 portable concentrator  Question Answer Comment  Length of Need Lifetime   Mode or (Route) Nasal cannula   Liters per Minute 4   Frequency Continuous (stationary and portable oxygen unit needed)   Oxygen conserving device Yes   Oxygen delivery system Gas      07/01/20 0951         Allergies  Allergen Reactions  . Codeine     REACTION: itching  . Penicillins     REACTION: UNSPECIFIED    Follow-up Information    Freddi Starr, MD Follow up on 07/20/2020.   Specialty: Pulmonary Disease Why: Follow-up appointment with Dr. Erin Fulling on 18 October@1100  [facility] information: [place] [address] [place] [address] [phone]                The results of significant diagnostics from this hospitalization (including imaging, microbiology, ancillary and laboratory) are listed below for reference.    Significant Diagnostic Studies: DG Chest Port 1 View  Result Date: 06/22/2020 CLINICAL DATA:  Cough, shortness of breath, and weakness. Recent COVID-19 diagnosis a week ago. EXAM: PORTABLE CHEST 1 VIEW COMPARISON:  CTA chest dated September 30, 2014. Chest x-ray dated September 29, 2014. FINDINGS: The heart size and mediastinal contours are within normal limits. Normal pulmonary vascularity. Patchy interstitial and airspace opacities in the right mid lung and both lung bases. No pleural effusion or pneumothorax. No acute osseous abnormality. IMPRESSION: 1. Bilateral patchy interstitial and airspace disease consistent with multifocal COVID-19 pneumonia. Electronically Signed   By: Titus Dubin M.D.   On: 06/22/2020 14:43   DG Abd Acute W/Chest  Result Date: 06/19/2020 CLINICAL DATA:  Shortness of breath and dehydration. Lower abdominal pain for a week. COVID-19 positive for a week. EXAM: X-RAY ABDOMEN 3 VIEW COMPARISON:  None. FINDINGS: The heart, hila, and mediastinum are normal. No pneumothorax. Mild bilateral patchy opacities, most  prominent in the left base, are identified. No other abnormalities in the chest. No free air, portal venous gas, or pneumatosis. No evidence of bowel obstruction. No renal or ureteral stones  noted. IMPRESSION: 1. Bilateral pulmonary opacities, left greater than right, worrisome for developing infiltrates/multifocal pneumonia given the patient's COVID-19 positive status. 2. No acute abnormalities in the abdomen noted. Electronically Signed   By: Dorise Bullion III M.D   On: 06/19/2020 15:05    Microbiology: Recent Results (from the past 240 hour(s))  SARS Coronavirus 2 by RT PCR (hospital order, performed in [facility] hospital lab) Nasopharyngeal Nasopharyngeal Swab     Status: Abnormal   Collection Time: 06/22/20  2:15 PM   Specimen: Nasopharyngeal Swab  Result Value Ref Range Status   SARS Coronavirus 2 POSITIVE (A) NEGATIVE Final    Comment: RESULT CALLED TO, READ BACK BY AND VERIFIED WITH: S.WEST,RN [id number] @1650  BY V.WILKINS (NOTE) SARS-CoV-2 target nucleic acids are DETECTED  SARS-CoV-2 RNA is generally detectable in upper respiratory specimens  during the acute phase of infection.  Positive results are indicative  of the presence of the identified virus, but do not rule out bacterial infection or co-infection with other pathogens not detected by the test.  Clinical correlation with patient history and  other diagnostic information is necessary to determine patient infection status.  The expected result is negative.  Fact Sheet for Patients:   StrictlyIdeas.no   Fact Sheet for Healthcare Providers:   BankingDealers.co.za    This test is not yet approved or cleared by the [place] FDA and  has been authorized for detection and/or diagnosis of SARS-CoV-2 by FDA under an Emergency Use Authorization (EUA).  This EUA will remain in effect (meaning this t est can be used) for the duration of  the COVID-19 declaration under Section  564(b)(1) of the Act, 21 U.S.C. section 360-bbb-3(b)(1), unless the authorization is terminated or revoked sooner.  Performed at [facility] [place] [address] [place], [place] [address]   Blood Culture (routine x 2)     Status: None   Collection Time: 06/22/20  3:42 PM   Specimen: BLOOD LEFT FOREARM  Result Value Ref Range Status   Specimen Description   Final    BLOOD LEFT FOREARM Performed at [facility] [address], [place], [place] [address]    Special Requests   Final    BOTTLES DRAWN AEROBIC AND ANAEROBIC Blood Culture results may not be optimal due to an inadequate volume of blood received in culture bottles Performed at [facility] [address], [place], [place] [address]    Culture   Final    NO GROWTH 5 DAYS Performed at [facility], [place] [address], [place], [place] [address]    Report Status 06/27/2020 FINAL  Final  Blood Culture (routine x 2)     Status: None   Collection Time: 06/22/20  4:20 PM   Specimen: BLOOD  Result Value Ref Range Status   Specimen Description   Final    BLOOD SITE NOT SPECIFIED Performed at [facility] [address], [place], [place] [address]    Special Requests   Final    BOTTLES DRAWN AEROBIC AND ANAEROBIC Blood Culture results may not be optimal due to an inadequate volume of blood received in culture bottles Performed at [facility] [address], [place], [place] [address]    Culture   Final    NO GROWTH 5 DAYS Performed at [facility], [place] [address], [place], [place] [address]    Report Status 06/27/2020 FINAL  Final     Labs: Basic Metabolic  Panel: Recent Labs  Lab 06/25/20 0426 06/26/20 0408 06/27/20 0524 06/28/20 0542 06/29/20 0753 06/30/20 0428 07/01/20 0426  NA 132*   < > 133* 132* 136 132* 134*  K 4.5   < > 5.0 5.2* 5.5* 4.9 4.7  CL 96*   < > 95* 95* 98 97* 96*  CO2 24   < > 30 30 30 25 30   GLUCOSE  174*   < > 172* 162* 125* 156* 138*  BUN 26*   < > 23 22 20 22  24*  CREATININE 0.83   < > 0.80 0.78 0.86 0.75 0.90  CALCIUM 8.4*   < > 8.4* 8.3* 8.9 8.5* 8.4*  MG 2.5*  --   --   --   --   --   --   PHOS 3.7  --   --   --   --   --   --    < > = values in this interval not displayed.   Liver Function Tests: Recent Labs  Lab 06/27/20 0524 06/28/20 0542 06/29/20 0753 06/30/20 0428 07/01/20 0426  AST 48* 31 29 25 29   ALT 83* 69* 60* 58* 64*  ALKPHOS 39 39 43 39 41  BILITOT 0.5 0.7 0.9 0.5 0.7  PROT 5.7* 5.7* 6.6 6.2* 5.6*  ALBUMIN 2.6* 2.7* 2.8* 2.8* 2.6*   No results for input(s): LIPASE, AMYLASE in the last 168 hours. No results for input(s): AMMONIA in the last 168 hours. CBC: Recent Labs  Lab 06/27/20 0524 06/28/20 0542 06/29/20 0451 06/30/20 0428 07/01/20 0426  WBC 12.2* 13.0* 13.7* 14.7* 12.0*  NEUTROABS 10.5* 10.9* 11.6* 12.4* 9.7*  HGB 13.0 13.1 14.0 14.2 12.9*  HCT 39.7 40.2 42.8 43.4 39.1  MCV 90.4 91.0 90.5 90.6 90.1  PLT 423* 485* 426* 582* 422*   Cardiac Enzymes: No results for input(s): CKTOTAL, CKMB, CKMBINDEX, TROPONINI in the last 168 hours. BNP: BNP (last 3 results) Recent Labs    06/22/20 1542  BNP 192.8*    ProBNP (last 3 results) No results for input(s): PROBNP in the last 8760 hours.  CBG: Recent Labs  Lab 06/30/20 1030 06/30/20 1149 06/30/20 1642 06/30/20 2046 07/01/20 0808  GLUCAP 190* 157* 272* 224* 86       Signed:  Dia Crawford, MD [facility] [phone] pager

## 2020-07-01 NOTE — Progress Notes (Signed)
Physical Therapy Treatment Patient Details Name: Bryan Kirby MRN: [id number] DOB: [birth year] Today's Date: 07/01/2020    History of Present Illness 64 y.o. male with medical history significant for obesity, OSA on CPAP, chronic anxiety/depression, HLD, GERD, recent diagnosis of covid-19 viral infection on 06/15/20 at [facility], had monoclonal antibodies on 06/21/20 and felt worse afterwards who presented with worsening dyspnea of 2 days duration.  Associated with persistent non productive cough, bodys aches, chills, poor appetite and generalized weakness.    PT Comments    Pt progressing, incr gait distance today. SpO2=90--75--84% on 4L. Continues to desat significantly with amb, recovery time ~ 3 minutes to bring Sats back to upper 80s. Discussed im[portance of activity progression at home. Continue to recommend HHPT    Follow Up Recommendations  Home health PT     Equipment Recommendations   (pt states he has RW if needed )    Recommendations for Other Services       Precautions / Restrictions Precautions Precautions: Other (comment);Fall Precaution Comments: monitor O2 Restrictions Weight Bearing Restrictions: No    Mobility  Bed Mobility Overal bed mobility: Modified Independent                Transfers   Equipment used: None Transfers: Sit to/from Stand Sit to Stand: Supervision         General transfer comment: for safety  Ambulation/Gait Ambulation/Gait assistance: Supervision Gait Distance (Feet): 80 Feet Assistive device:  (pushing dynamap part of distance ) Gait Pattern/deviations: Step-through pattern;Decreased stride length     General Gait Details: no LOB today, one standing rest d/t DOE. pt with 3/4 DOE, SpO2 in 80s on 4L during amb    Stairs             Wheelchair Mobility    Modified Rankin (Stroke Patients Only)       Balance     Sitting balance-Leahy Scale: Good       Standing balance-Leahy Scale: Fair                               Cognition Arousal/Alertness: Awake/alert Behavior During Therapy: WFL for tasks assessed/performed Overall Cognitive Status: Within Functional Limits for tasks assessed                                 General Comments: pt wife d/c'd home yesterday      Exercises      General Comments        Pertinent Vitals/Pain Pain Assessment: No/denies pain    Home Living                      Prior Function            PT Goals (current goals can now be found in the care plan section) Acute Rehab PT Goals Patient Stated Goal: 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, community, go to church PT Goal Formulation: With patient Time For Goal Achievement: 07/08/20 Potential to Achieve Goals: Good Progress towards PT goals: Progressing toward goals    Frequency    Min 3X/week      PT Plan Current plan remains appropriate    Co-evaluation              AM-PAC PT "6 Clicks" Mobility   Outcome Measure  Help needed turning from your back to your side  while in a flat bed without using bedrails?: None Help needed moving from lying on your back to sitting on the side of a flat bed without using bedrails?: None Help needed moving to and from a bed to a chair (including a wheelchair)?: None Help needed standing up from a chair using your arms (e.g., wheelchair or bedside chair)?: None Help needed to walk in hospital room?: A Little Help needed climbing 3-5 steps with a railing? : A Little 6 Click Score: 22    End of Session Equipment Utilized During Treatment: Oxygen Activity Tolerance: Patient tolerated treatment well Patient left: in chair;with call bell/phone within reach   PT Visit Diagnosis: Difficulty in walking, not elsewhere classified (R26.2)     Time: 1021-1050 PT Time Calculation (min) (ACUTE ONLY): 29 min  Charges:  $Gait Training: 23-37 mins                     Baxter Flattery, PT  Acute Rehab Dept ([place]) [phone] Pager  [phone]  07/01/2020    [facility] 07/01/2020, 11:48 AM

## 2020-07-13 ENCOUNTER — Emergency Department (HOSPITAL_COMMUNITY)
Admission: EM | Admit: 2020-07-13 | Discharge: 2020-07-13 | Disposition: A | Discharge status: Home / Self Care | Payer: Self-pay | Attending: Emergency Medicine | Admitting: Emergency Medicine

## 2020-07-13 ENCOUNTER — Other Ambulatory Visit: Payer: Self-pay

## 2020-07-13 DIAGNOSIS — Z7982 Long term (current) use of aspirin: Secondary | ICD-10-CM | POA: Insufficient documentation

## 2020-07-13 DIAGNOSIS — Z87891 Personal history of nicotine dependence: Secondary | ICD-10-CM | POA: Insufficient documentation

## 2020-07-13 DIAGNOSIS — R040 Epistaxis: Secondary | ICD-10-CM | POA: Insufficient documentation

## 2020-07-13 MED ORDER — OXYMETAZOLINE HCL 0.05 % NA SOLN
1.0000 | Freq: Once | NASAL | Status: AC
  Administered 2020-07-13: 1 via NASAL
  Filled 2020-07-13: qty 30

## 2020-07-13 MED ORDER — ONDANSETRON 4 MG PO TBDP
4.0000 mg | ORAL_TABLET | Freq: Once | ORAL | Status: AC
  Administered 2020-07-13: 4 mg via ORAL
  Filled 2020-07-13: qty 1

## 2020-07-13 MED ORDER — METOCLOPRAMIDE HCL 5 MG/ML IJ SOLN: 10.0000 mg | Freq: Once | INTRAMUSCULAR | Status: DC

## 2020-07-13 MED ORDER — ACETAMINOPHEN 325 MG PO TABS
650.0000 mg | ORAL_TABLET | Freq: Once | ORAL | Status: AC
  Administered 2020-07-13: 650 mg via ORAL
  Filled 2020-07-13: qty 2

## 2020-07-13 NOTE — ED Provider Notes (Signed)
[facility] DEPT Provider Note   CSN: [id number] Arrival date & time: 07/13/20  1057     History Chief Complaint  Patient presents with   Epistaxis    Bryan Kirby is a 64 y.o. male.  The history is provided by the patient.  Epistaxis Location:  L nare Severity:  Severe Duration:  1 hour Timing:  Constant Progression:  Partially resolved Chronicity:  New Context: aspirin use and home oxygen   Context: not anticoagulants   Relieved by: packing gauze in the nose. Worsened by:  Nothing Ineffective treatments:  None tried Associated symptoms: blood in oropharynx   Associated symptoms: no cough, no fever, no headaches and no sore throat   Risk factors comment:  Hx of recent COVID home now for 2 weeks but with intermittent SOB and wearing home O2 prn.  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   Qualifier: Diagnosis of  By: Council Mechanic MD, Hilaria Ota    Sleep apnea    USES CPAP    Patient Active Problem List   Diagnosis Date Noted   Pneumonia due to COVID-19 virus 06/22/2020   COVID-19 virus infection 06/19/2020   Abdominal pain 06/19/2020   Chronic pain of right knee 02/07/2019   Unilateral primary osteoarthritis, right knee 02/07/2019   Lung nodule 09/30/2014   OSA (obstructive sleep apnea) 12/20/2013   Obesity (BMI 30-39.9) 08/27/2013   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   HLD (hyperlipidemia) 03/15/2007   Allergic rhinitis 03/15/2007   HERNIATED DISC  L1/2 R SIDE 03/15/2007    Past Surgical History:  Procedure Laterality Date   CARPAL  TUNNEL RELEASE Right    CYSTECTOMY     EYE SURGERY     KNEE ARTHROSCOPY     LEFT LAT MENISCUS TEAR   KNEE SURGERY     LEFT KNEE CAP WIRING AND PATELLAR REATTACHMENT   LEFT HEART CATHETERIZATION WITH CORONARY ANGIOGRAM N/A 10/02/2014   Procedure: LEFT HEART CATHETERIZATION WITH CORONARY ANGIOGRAM;  Surgeon: Sanda Klein, MD;  Location: [facility] CATH [facility];  Service: Cardiovascular;  Laterality: N/A;   ORCHIECTOMY     MALDEVELOPEMENT AFTER ORCHITIS   POLYPECTOMY     OF PENIS       Family History  Problem Relation Age of Onset   Fibromyalgia Mother    Depression Mother    Cancer Father        PROSTATECTOMY   Stroke Father    Cancer Sister        CERVICAL   Stroke Paternal Grandfather    Hypertension Paternal Grandfather    Diabetes Paternal Grandfather     Social History   Tobacco Use   Smoking status: Former Smoker    Packs/day: 0.50    Years: 1.00    Pack years: 0.50    Types: Cigarettes    Quit date: 10/03/1973    Years since quitting: 46.8   Smokeless tobacco: Never Used  Vaping Use   Vaping Use: Never used  Substance Use Topics   Alcohol use: No  Drug use: No    Home Medications Prior to Admission medications   Medication Sig Start Date End Date Taking? Authorizing Provider  Acetaminophen (TYLENOL 8 HOUR PO) Take 1,000 mg by mouth once as needed (pain).    [provider]  albuterol (VENTOLIN HFA) 108 (90 Base) MCG/ACT inhaler Inhale 2 puffs into the lungs every 4 (four) hours as needed for wheezing or shortness of breath. 07/01/20   Allie Bossier, MD  ascorbic acid (VITAMIN C) 500 MG tablet Take 1 tablet (500 mg total) by mouth daily. 07/02/20   Allie Bossier, MD  aspirin 81 MG tablet Take 81 mg by mouth daily.    [provider]  chlorpheniramine-HYDROcodone (TUSSIONEX) 10-8 MG/5ML SUER Take 5 mLs by mouth every 12 (twelve) hours as needed for cough. 07/01/20   Allie Bossier, MD  cholecalciferol (VITAMIN D) 25 MCG tablet  Take 1 tablet (1,000 Units total) by mouth daily. 07/02/20   Allie Bossier, MD  diclofenac sodium (VOLTAREN) 1 % GEL Apply 4 g topically 4 (four) times daily as needed. Apply to large joint PRN Patient not taking: Reported on 06/22/2020 02/08/18   Garald Balding, MD  escitalopram (LEXAPRO) 20 MG tablet TAKE ONE TABLET BY MOUTH DAILY 02/10/20   Copland, Frederico Hamman, MD  folic acid (FOLVITE) 1 MG tablet Take 1 tablet (1 mg total) by mouth daily. 07/02/20   Allie Bossier, MD  guaiFENesin-dextromethorphan (ROBITUSSIN DM) 100-10 MG/5ML syrup Take 10 mLs by mouth every 4 (four) hours as needed for cough. 07/01/20   Allie Bossier, MD  ipratropium (ATROVENT HFA) 17 MCG/ACT inhaler Inhale 2 puffs into the lungs every 6 (six) hours as needed (shortness of breath). 07/01/20   Allie Bossier, MD  melatonin 3 MG TABS tablet Take 1 tablet (3 mg total) by mouth at bedtime as needed (sleep). 07/01/20   Allie Bossier, MD  ondansetron (ZOFRAN ODT) 4 MG disintegrating tablet Take 1 tablet (4 mg total) by mouth every 8 (eight) hours as needed for nausea or vomiting. 06/19/20   Bast, Traci A, NP  pantoprazole (PROTONIX) 40 MG tablet TAKE ONE TABLET BY MOUTH DAILY 05/12/20   Copland, Frederico Hamman, MD  phenol (CHLORASEPTIC) 1.4 % LIQD Use as directed 1 spray in the mouth or throat as needed for throat irritation / pain. 07/01/20   Allie Bossier, MD  predniSONE (DELTASONE) 50 MG tablet Take 1 tablet (50 mg total) by mouth daily before breakfast. 07/02/20   Allie Bossier, MD  simvastatin (ZOCOR) 40 MG tablet TAKE ONE TABLET BY MOUTH AT BEDTIME 05/13/20   Copland, Frederico Hamman, MD  zinc sulfate 220 (50 Zn) MG capsule Take 1 capsule (220 mg total) by mouth daily. 07/02/20   Allie Bossier, MD    Allergies    Codeine and Penicillins  Review of Systems   Review of Systems  Constitutional: Negative for fever.  HENT: Positive for nosebleeds. Negative for sore throat.   Respiratory: Negative for cough.   Neurological: Negative for  headaches.  All other systems reviewed and are negative.   Physical Exam Updated Vital Signs SpO2 94%   Physical Exam Vitals and nursing note reviewed.  Constitutional:      General: He is not in acute distress.    Appearance: He is well-developed.  HENT:     Head: Normocephalic and atraumatic.     Nose:     Comments: Epistaxis from the left nare with some minimal blood in the oropharynx.  Gauze currently placed in the left nare and no active bleeding seen.  When gauze was removed, clot was evacuated and pt has pulsating bleeding along the septum of the right nare Eyes:     Conjunctiva/sclera: Conjunctivae normal.     Pupils: Pupils are equal, round, and reactive to light.  Cardiovascular:     Rate and Rhythm: Normal rate.     Heart sounds: No murmur heard.   Pulmonary:     Effort: Pulmonary effort is normal. No respiratory distress.  Musculoskeletal:        General: No tenderness. Normal range of motion.     Cervical back: Normal range of motion and neck supple.  Skin:    General: Skin is warm and dry.     Findings: No erythema or rash.  Neurological:     Mental Status: He is alert and oriented to person, place, and time.  Psychiatric:        Mood and Affect: Mood normal.        Behavior: Behavior normal.        Thought Content: Thought content normal.     ED Results / Procedures / Treatments   Labs (all labs ordered are listed, but only abnormal results are displayed) Labs Reviewed - No data to display  EKG None  Radiology No results found.  Procedures .Epistaxis Management  Date/Time: 07/13/2020 11:46 AM Performed by: Blanchie Dessert, MD Authorized by: Blanchie Dessert, MD   Consent:    Consent obtained:  Verbal   Consent given by:  Patient   Risks discussed:  Bleeding and pain   Alternatives discussed:  No treatment Anesthesia (see MAR for exact dosages):    Anesthesia method:  None Procedure details:    Treatment site:  L anterior    Treatment method:  Anterior pack   Treatment complexity:  Extensive   Treatment episode: initial   Post-procedure details:    Assessment:  Bleeding stopped   Patient tolerance of procedure:  Tolerated well, no immediate complications   (including critical care time)  Medications Ordered in ED Medications - No data to display  ED Course  I have reviewed the triage vital signs and the nursing notes.  Pertinent labs & imaging results that were available during my care of the patient were reviewed by me and considered in my medical decision making (see chart for details).    MDM Rules/Calculators/A&P                          Patient presenting today with left-sided epistaxis that started spontaneously.  Patient has been having to wear oxygen as needed after recent Covid infection and hypoxia.  Patient reports that today he was feeling fine and did not need to use the oxygen was driving at work when it suddenly started bleeding.  Patient has packing gauze up into his nose reports the bleeding has improved he still feels a mild amount in the back of his throat but reports that it is much better.  He does take an 81 mg aspirin but no other anticoagulation.  He denies any shortness of breath at this time and is currently in no acute distress.  Will remove gauze packing and will try to identify bleed but seems to be anterior.  Patient may require packing if the bleeding has not stopped.  11:53 AM Brisk anterior bleed requiring packing.  Cessation of bleeding and will observe for rebleed.  sats remains stable at  94% on RA.  Bleeding most likely the result from having to wear home O2.  Final Clinical Impression(s) / ED Diagnoses Final diagnoses:  None    Rx / DC Orders ED Discharge Orders    None       Blanchie Dessert, MD 07/15/20 1351

## 2020-07-13 NOTE — ED Triage Notes (Signed)
[facility] to [facility] ED and reports the following:  Pt nose started bleeding 20 minutes ago. This happened last week, but he stopped it on his own. Had COVID, admitted for 8 days, COVID sx started over 2 weeks ago, currently using CPAP at night. BP 132 palpated.

## 2020-07-13 NOTE — Discharge Instructions (Signed)
Squirt afrin on the packing 2 times a day for the next  3 days and then use saline spray 2 times a day until you see ENT for packing removal.

## 2020-07-14 NOTE — Progress Notes (Signed)
Bryan Kirby T. Bryan Asfour, MD Primary Care and [place] at [facility] [place], [address] Phone: [phone]   FAX: [place] - 64 y.o. male   MRN [id number]   Date of Birth: May 23, 1956  Visit Date: 07/15/2020   PCP: Owens Loffler, MD   Referred by: Owens Loffler, MD Chief Complaint  Patient presents with   Hospitalization Follow-up    Covid   Virtual Visit via Video Note:  I connected with  Bryan Kirby on 07/15/2020  8:40 AM EDT by a video enabled telemedicine application and verified that I am speaking with the correct person using two identifiers.   Location patient: home computer, tablet, or smartphone Location provider: work or home office Consent: Verbal consent directly obtained from [name]. Persons participating in the virtual visit: patient, provider  I discussed the limitations of evaluation and management by telemedicine and the availability of in person appointments. The patient expressed understanding and agreed to proceed.  Interactive audio and video telecommunications were attempted between this provider and patient, however failed, due to patient having technical difficulties OR patient did not have access to video capability.  We continued and completed visit with audio only.    History of Present Illness:  [name] is here for a complex hospital follow-up status post Covid pneumonia.  He was in respiratory failure and was admitted into the hospital with Covid pneumonia.  On chart review, he was in the ER again yesterday.  He had a nosebleed that would not stop and required packing.  He is on home oxygen.  Bleeding had stopped by the time he had left the hospital./ER.  Admit date: 06/22/2020 Discharge date: 07/01/2020  He was maximally treated for Covid pneumonia while he was in the hospital.  At the time of his discharge he was discharged on 3 L of nasal cannula oxygen.   Details of his hospitalization and Covid treatment are noted below from his discharge summary.  He was discharged andTold to attempt to wean off of 4 L of oxygen  He was initially diagnosed with COVID-19 on June 15, 2020.  Did get monoclonal antibodies on June 21, 2020, but his symptoms continue to worsen.  COVID-19 -Tested positive 9/13 and again 9/20 -Received monoclonal antibodies 9/19 -Completed Remdesivir 9/20-9/24 -Continue Baricitinib 9/20 >> 9/29 -Continue Steroids 9/20 >>  9/30  SATURATION QUALIFICATIONS: (This note is used to comply with regulatory documentation for home oxygen) Patient Saturations on Room Air at Rest = 89% Patient Saturations on Room Air while Ambulating = 77% Patient Saturations on 4 Liters of oxygen while Ambulating = 90% Please briefly explain why patient needs home oxygen: Patient oxygen saturation drop with exertion w/o oxygen. Therefore, patient will need supplemental oxygen when home.  -Patient meets criteria for home O2 -4 L O2 titrate to maintain SPO2> 88% -Provide patient with [employer] home O2 portable concentrator -Patient will be considered contagious until 10/11 and therefore should follow the below recommendations for his own safety as well as his family's. -Follow-up appointment with Dr. Erin Fulling on 18 October@1100  [facility] in [place]. Wed, tried to do a job on Monday, and blood was pouring out of his nose. Went to the ER, eventually stopped it.  Not on 02 now.  Was on 3L earlier. Did for a week.  Immunization History  Administered Date(s) Administered   Influenza Split 08/22/2012   Influenza,inj,Quad PF,6+ Mos  08/27/2013, 07/01/2014, 09/16/2015, 09/04/2017   Td 05/10/1999   Tdap 04/08/2013    Flu and pneumonia  Review of Systems as above: See pertinent positives and pertinent negatives per HPI No acute distress verbally   Observations/Objective/Exam:  Vitals:   07/15/20 0837  BP: 118/78   Pulse: 85  Temp: 98.2 F (36.8 C)  TempSrc: Temporal  SpO2: 90%  Weight: 261 lb (118.4 kg)  Height: 5\' 11"  (1.803 m)    An attempt was made to discern vital signs over the phone and per patient if applicable and possible.   General:    Alert, Oriented, appears well and in no acute distress  Pulmonary:     On inspection no signs of respiratory distress.  Psych / Neurological:     Pleasant and cooperative.  Assessment and Plan:    ICD-10-CM   1. Pneumonia due to COVID-19 virus  U07.1    J12.82   2. Acute respiratory failure due to COVID-19 (HCC)  U07.1    J96.00   3. Epistaxis  R04.0    Total encounter time: 20 minutes. This includes total time spent on the day of encounter.   ENT pending. Pulm pending.  Ideally, I would have liked to keep him on 02, but with severe nosebleeds req ER, I don't think this is prudent.  ENT visit on Friday, possible cauterization.  If ENT approves, then going back on 02, particularly with any activity would be definitely beneficial.  Cont with inhalers.  He is improving, but slowly.  Difficult to predict time of recovery, but more likely multiple weeks to potentially months.  I discussed the assessment and treatment plan with the patient. The patient was provided an opportunity to ask questions and all were answered. The patient agreed with the plan and demonstrated an understanding of the instructions.   The patient was advised to call back or seek an in-person evaluation if the symptoms worsen or if the condition fails to improve as anticipated.  Follow-up: prn unless noted otherwise below No follow-ups on file.  No orders of the defined types were placed in this encounter.  No orders of the defined types were placed in this encounter.   Signed,  Maud Deed. Cynde Menard, MD

## 2020-07-15 ENCOUNTER — Other Ambulatory Visit: Payer: Self-pay

## 2020-07-15 ENCOUNTER — Encounter: Payer: Self-pay | Admitting: Family Medicine

## 2020-07-15 ENCOUNTER — Telehealth (INDEPENDENT_AMBULATORY_CARE_PROVIDER_SITE_OTHER): Payer: HRSA Program | Admitting: Family Medicine

## 2020-07-15 VITALS — BP 118/78 | HR 85 | Temp 98.2°F | Ht 71.0 in | Wt 261.0 lb

## 2020-07-15 DIAGNOSIS — R040 Epistaxis: Secondary | ICD-10-CM | POA: Diagnosis not present

## 2020-07-15 DIAGNOSIS — J1282 Pneumonia due to coronavirus disease 2019: Secondary | ICD-10-CM | POA: Diagnosis not present

## 2020-07-15 DIAGNOSIS — J9600 Acute respiratory failure, unspecified whether with hypoxia or hypercapnia: Secondary | ICD-10-CM | POA: Diagnosis not present

## 2020-07-15 DIAGNOSIS — U071 COVID-19: Secondary | ICD-10-CM

## 2020-07-20 ENCOUNTER — Encounter: Payer: Self-pay | Admitting: Pulmonary Disease

## 2020-07-20 ENCOUNTER — Other Ambulatory Visit: Payer: Self-pay

## 2020-07-20 ENCOUNTER — Ambulatory Visit (INDEPENDENT_AMBULATORY_CARE_PROVIDER_SITE_OTHER): Payer: HRSA Program | Admitting: Pulmonary Disease

## 2020-07-20 VITALS — BP 122/74 | HR 126 | Temp 97.0°F | Ht 71.0 in | Wt 260.4 lb

## 2020-07-20 DIAGNOSIS — R040 Epistaxis: Secondary | ICD-10-CM

## 2020-07-20 DIAGNOSIS — U071 COVID-19: Secondary | ICD-10-CM | POA: Diagnosis not present

## 2020-07-20 DIAGNOSIS — J1282 Pneumonia due to coronavirus disease 2019: Secondary | ICD-10-CM

## 2020-07-20 DIAGNOSIS — J9611 Chronic respiratory failure with hypoxia: Secondary | ICD-10-CM | POA: Diagnosis not present

## 2020-07-20 NOTE — Patient Instructions (Addendum)
Use Ayr nasal saline gel for the nose bleeding Use albuterol inhaler as needed Continue supplemental oxygen 2-3L with ambulation Follow up in 6 months with breathing tests

## 2020-07-20 NOTE — Progress Notes (Signed)
Synopsis: Referred by Dr. Dia Crawford, MD  for hospital follow up for covid 19  Subjective:   PATIENT ID: Bryan Kirby: male DOB: 04-17-1956, MRN: [id number]   HPI  Chief Complaint  Patient presents with   Consult    shortness of breath and on oxygen at home, doesn't wear all the time, wants to come off it.   Bryan Kirby is a 64 year old male with history of obstructive sleep apnea and obesity who is referred to pulmonary clinic for hospital follow up for covid 19 pneumonia and respiratory failure.   He was discharged on 5L of supplemental oxygen via nasal canula. He was treated with monoclonal antibodies on 06/21/20, remdesivir 06/11/25/24, baricitinib 29-Jun-2023 - 9/29 and steroids 9/09-26-2030.  He has been using the oxygen as needed at home. He is having issues with epistaxis. He is asking if he is safe to return to work. He is a Development worker, community. He rarely uses N95 mask or respirator while working.   He has dry cough. Denies fevers, chills or sweats.   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   Qualifier: Diagnosis of  By: Council Mechanic MD, Hilaria Ota    Pneumonia    Sleep apnea    USES CPAP     Family History  Problem Relation Age of Onset   Fibromyalgia Mother    Depression Mother    Cancer Father        PROSTATECTOMY   Stroke Father    Cancer Sister        CERVICAL   Stroke Paternal Grandfather    Hypertension Paternal Grandfather    Diabetes Paternal Grandfather      Social History   Socioeconomic History   Marital status: Married    Spouse name: Not on file   Number of children: 1   Years of education: Not on file   Highest education level: Not on file   Occupational History   Occupation: PLUMBER  Tobacco Use   Smoking status: Former Smoker    Packs/day: 0.50    Years: 1.00    Pack years: 0.50    Types: Cigarettes    Quit date: 10/03/1973    Years since quitting: 46.8   Smokeless tobacco: Never Used  Vaping Use   Vaping Use: Never used  Substance and Sexual Activity   Alcohol use: No   Drug use: No   Sexual activity: Not on file  Other Topics Concern   Not on file  Social History Narrative   Not on file   Social Determinants of Health   Financial Resource Strain:    Difficulty of Paying Living Expenses: Not on file  Food Insecurity:    Worried About Charity fundraiser in the Last Year: Not on file   [employer] of Food in the Last Year: Not on file  Transportation Needs:    Lack of Transportation (Medical): Not on file   Lack of Transportation (Non-Medical): Not on file  Physical Activity:    Days of Exercise per Week: Not on file   Minutes of Exercise per Session: Not on file  Stress:  Feeling of Stress : Not on file  Social Connections:    Frequency of Communication with Friends and Family: Not on file   Frequency of Social Gatherings with Friends and Family: Not on file   Attends Religious Services: Not on file   Active Member of Clubs or Organizations: Not on file   Attends Archivist Meetings: Not on file   Marital Status: Not on file  Intimate Partner Violence:    Fear of Current or Ex-Partner: Not on file   Emotionally Abused: Not on file   Physically Abused: Not on file   Sexually Abused: Not on file     Allergies  Allergen Reactions   Codeine     REACTION: itching   Penicillins     REACTION: UNSPECIFIED     Outpatient Medications Prior to Visit  Medication Sig Dispense Refill   Acetaminophen (TYLENOL 8 HOUR PO) Take 1,000 mg by mouth once as needed (pain).     albuterol (VENTOLIN HFA) 108 (90 Base) MCG/ACT inhaler Inhale 2 puffs into the lungs every 4  (four) hours as needed for wheezing or shortness of breath. 6.7 g 0   Ascorbic Acid (VITAMIN C) 1000 MG tablet      cholecalciferol (VITAMIN D) 25 MCG tablet Take 1 tablet (1,000 Units total) by mouth daily. 30 tablet 0   diclofenac sodium (VOLTAREN) 1 % GEL Apply 4 g topically 4 (four) times daily as needed. Apply to large joint PRN 3 Tube 4   escitalopram (LEXAPRO) 20 MG tablet TAKE ONE TABLET BY MOUTH DAILY 90 tablet 3   folic acid (FOLVITE) 1 MG tablet Take 1 tablet (1 mg total) by mouth daily. 30 tablet 0   ipratropium (ATROVENT HFA) 17 MCG/ACT inhaler Inhale 2 puffs into the lungs every 6 (six) hours as needed (shortness of breath). 1 each 0   ondansetron (ZOFRAN ODT) 4 MG disintegrating tablet Take 1 tablet (4 mg total) by mouth every 8 (eight) hours as needed for nausea or vomiting. 20 tablet 0   pantoprazole (PROTONIX) 40 MG tablet TAKE ONE TABLET BY MOUTH DAILY 90 tablet 1   simvastatin (ZOCOR) 40 MG tablet TAKE ONE TABLET BY MOUTH AT BEDTIME 90 tablet 1   zinc sulfate 220 (50 Zn) MG capsule Take 1 capsule (220 mg total) by mouth daily. 30 capsule 0   No facility-administered medications prior to visit.    Review of Systems  Constitutional: Negative for chills, diaphoresis, fever, malaise/fatigue and weight loss.  HENT: Positive for nosebleeds. Negative for congestion and sore throat.   Respiratory: Positive for cough and shortness of breath. Negative for hemoptysis, sputum production and wheezing.   Cardiovascular: Negative for chest pain, palpitations, orthopnea, claudication, leg swelling and PND.  Gastrointestinal: Negative for abdominal pain, blood in stool, constipation, diarrhea, heartburn, melena, nausea and vomiting.  Genitourinary: Negative for dysuria and hematuria.  Musculoskeletal: Negative for joint pain and myalgias.  Neurological: Negative for weakness and headaches.  Endo/Heme/Allergies: Does not bruise/bleed easily.  Psychiatric/Behavioral: Negative.      Objective:   Vitals:   07/20/20 1058  BP: 122/74  Pulse: (!) 126  Temp: (!) 97 F (36.1 C)  TempSrc: Temporal  SpO2: 90%  Weight: 260 lb 6.4 oz (118.1 kg)  Height: 5\' 11"  (1.803 m)     Physical Exam Constitutional:      Appearance: He is obese.  HENT:     Head: Normocephalic and atraumatic.     Nose: Nose normal.  Mouth/Throat:     Mouth: Mucous membranes are moist.     Pharynx: Oropharynx is clear.  Eyes:     General: No scleral icterus.    Conjunctiva/sclera: Conjunctivae normal.     Pupils: Pupils are equal, round, and reactive to light.  Cardiovascular:     Rate and Rhythm: Normal rate and regular rhythm.     Pulses: Normal pulses.     Heart sounds: No murmur heard.   Pulmonary:     Effort: Pulmonary effort is normal.     Breath sounds: Rales (bilateral bases) present.  Abdominal:     General: Bowel sounds are normal.     Palpations: Abdomen is soft.  Musculoskeletal:     Cervical back: Neck supple.     Right lower leg: No edema.     Left lower leg: No edema.  Lymphadenopathy:     Cervical: No cervical adenopathy.  Skin:    Findings: No lesion or rash.  Neurological:     General: No focal deficit present.     Mental Status: He is alert and oriented to person, place, and time. Mental status is at baseline.     Gait: Gait normal.  Psychiatric:        Mood and Affect: Mood normal.        Behavior: Behavior normal.        Thought Content: Thought content normal.        Judgment: Judgment normal.     CBC    Component Value Date/Time   WBC 12.0 (H) 07/01/2020 0426   RBC 4.34 07/01/2020 0426   HGB 12.9 (L) 07/01/2020 0426   HCT 39.1 07/01/2020 0426   PLT 422 (H) 07/01/2020 0426   MCV 90.1 07/01/2020 0426   MCH 29.7 07/01/2020 0426   MCHC 33.0 07/01/2020 0426   RDW 13.2 07/01/2020 0426   LYMPHSABS 0.8 07/01/2020 0426   MONOABS 1.0 07/01/2020 0426   EOSABS 0.0 07/01/2020 0426   BASOSABS 0.0 07/01/2020 0426     Chest imaging: CXR  06/22/20 The heart size and mediastinal contours are within normal limits. Normal pulmonary vascularity. Patchy interstitial and airspace opacities in the right mid lung and both lung bases. No pleural effusion or pneumothorax. No acute osseous abnormality.  CT Chest 2015 1. Negative for pulmonary embolism or other acute intrathoracic finding. 2. 4 mm pulmonary nodule on the right. Given smoking history, follow-up chest CT at 1 year is recommended. This recommendation follows the consensus statement: Guidelines for Management of Small Pulmonary Nodules Detected on CT Scans: A Statement from the [facility] as published in Radiology 2005; 237:395-400  Echo: 2015 - Left ventricle: The cavity size was normal. Systolic function was  normal. The estimated ejection fraction was in the range of 60%  to 65%. Wall motion was normal; there were no regional wall  motion abnormalities.  - Left atrium: The atrium was mildly dilated.  - Right ventricle: Systolic function was mildly reduced.  NM Myocardial Stress Test 09/2014 IMPRESSION: 1. Small sized, mild intensity partially fixed defect in the mid and basal inferior wall consistent with a small area of ischemia vs. Variations in attenuation artifact.  2. Normal left ventricular wall motion.  3. Left ventricular ejection fraction 62%  4. Intermediaterisk stress test findings*.     Assessment & Plan:   Pneumonia due to COVID-19 virus  Chronic respiratory failure with hypoxia ([place])  Epistaxis  Discussion: Oshae Simmering is a 64 year old male with history of obstructive  sleep apnea and obesity who is referred to pulmonary clinic for hospital follow up for covid 19 pneumonia and respiratory failure.  He was discharged on 5L of supplemental oxygen and currently is saturating in the low 90s on room air but continues to desaturate to unsafe levels below 88% with ambulation. He is to continue on supplemental oxygen 2-3L with  ambulation. 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   Recommended using Ayr nasal saline gel for the dry/irritated nasal passage. He will be seeing ENT for the epistaxis as well.   He is to follow up in 6 months with pulmonary function testing.  Freda Jackson, MD [facility] Office: [phone]   See Amion for Pager Details      Current Outpatient Medications:    Acetaminophen (TYLENOL 8 HOUR PO), Take 1,000 mg by mouth once as needed (pain)., Disp: , Rfl:    albuterol (VENTOLIN HFA) 108 (90 Base) MCG/ACT inhaler, Inhale 2 puffs into the lungs every 4 (four) hours as needed for wheezing or shortness of breath., Disp: 6.7 g, Rfl: 0   Ascorbic Acid (VITAMIN C) 1000 MG tablet, , Disp: , Rfl:    cholecalciferol (VITAMIN D) 25 MCG tablet, Take 1 tablet (1,000 Units total) by mouth daily., Disp: 30 tablet, Rfl: 0   diclofenac sodium (VOLTAREN) 1 % GEL, Apply 4 g topically 4 (four) times daily as needed. Apply to large joint PRN, Disp: 3 Tube, Rfl: 4   escitalopram (LEXAPRO) 20 MG tablet, TAKE ONE TABLET BY MOUTH DAILY, Disp: 90 tablet, Rfl: 3   folic acid (FOLVITE) 1 MG tablet, Take 1 tablet (1 mg total) by mouth daily., Disp: 30 tablet, Rfl: 0   ipratropium (ATROVENT HFA) 17 MCG/ACT inhaler, Inhale 2 puffs into the lungs every 6 (six) hours as needed (shortness of breath)., Disp: 1 each, Rfl: 0   ondansetron (ZOFRAN ODT) 4 MG disintegrating tablet, Take 1 tablet (4 mg total) by mouth every 8 (eight) hours as needed for nausea or vomiting., Disp: 20 tablet, Rfl: 0   pantoprazole (PROTONIX) 40 MG tablet, TAKE ONE TABLET BY MOUTH DAILY, Disp: 90 tablet, Rfl: 1   simvastatin (ZOCOR) 40 MG tablet, TAKE ONE TABLET BY MOUTH AT BEDTIME, Disp: 90 tablet, Rfl: 1   zinc sulfate 220 (50 Zn) MG capsule,  Take 1 capsule (220 mg total) by mouth daily., Disp: 30 capsule, Rfl: 0

## 2020-07-24 NOTE — Progress Notes (Signed)
Left voice message for pt not to show up for his covid test on Sat 07/25/20 due to pt testing + for covid on 06/23/20. Based on the guidelines the pt is in the 90 day window to not retest. The pt is still expected to quarantine until their procedure. Therefore, the pt can still have the scheduled procedure. The procedure is scheduled for Wed 07/29/20 with Dr. Erin Fulling.   These are the guidelines as follows:  Guidance: Patient previously tested + COVID; now past 90 day window seeking elective surgery (asymptomatic)  Retest patient If negative, proceed with surgery If positive, postpone surgery for 10 days from positive test Patient to quarantine for the (10 days) Do not retest again prior to surgery (even if scheduled a couple of weeks out) Use standard precautions for surgery

## 2020-07-25 ENCOUNTER — Inpatient Hospital Stay (HOSPITAL_COMMUNITY)
Admission: RE | Admit: 2020-07-25 | Discharge: 2020-07-25 | Disposition: A | Discharge status: Home / Self Care | Payer: Self-pay | Source: Ambulatory Visit

## 2020-08-31 ENCOUNTER — Telehealth: Payer: Self-pay | Admitting: Pulmonary Disease

## 2020-08-31 DIAGNOSIS — J310 Chronic rhinitis: Secondary | ICD-10-CM

## 2020-08-31 MED ORDER — IPRATROPIUM BROMIDE 0.03 % NA SOLN
2.0000 | Freq: Two times a day (BID) | NASAL | 12 refills | Status: DC
Start: 2020-08-31 — End: 2020-10-07

## 2020-08-31 NOTE — Telephone Encounter (Signed)
He is having sinus congestion and post nasal drainage. Will start ipratropium nasal spray. He had epistaxis in the past so will avoid fluticaonse nasal spray. He notices improvement in his cough with atrovent. Instructed him to use atrovent every 6 hours as needed and albuterol every 4-6 hours as needed. He does not have insurance so will hold off on further inhaler therapy at this time un we have PFTs later in December.   Freda Jackson, MD [facility] Office: [phone]   See Amion for Pager Details

## 2020-08-31 NOTE — Telephone Encounter (Signed)
Called and spoke with pt letting him know the info stated by Dr. Dewald and he verbalized understanding. Nothing further needed. 

## 2020-08-31 NOTE — Telephone Encounter (Signed)
Primary Pulmonologist: Dr. Erin Fulling Last office visit and with whom: 07/20/20 with JD  What do we see them for (pulmonary problems): covid pneumonia Last OV assessment/plan:   Assessment & Plan:   Pneumonia due to COVID-19 virus  Chronic respiratory failure with hypoxia ([place])  Epistaxis  Discussion: Bryan Kirby is a 64 year old male with history of obstructive sleep apnea and obesity who is referred to pulmonary clinic for hospital follow up for covid 19 pneumonia and respiratory failure.  He was discharged on 5L of supplemental oxygen and currently is saturating in the low 90s on room air but continues to desaturate to unsafe levels below 88% with ambulation. He is to continue on supplemental oxygen 2-3L with ambulation. 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   Recommended using Ayr nasal saline gel for the dry/irritated nasal passage. He will be seeing ENT for the epistaxis as well.   He is to follow up in 6 months with pulmonary function testing.   Reason for call: patient called because he is still real congested and having a cough. He is still coughing up clear to yellow mucus having soreness in the chest still knows his lungs are not to full capacity. He has taken himself off the oxygen completely states he does not need it.  Shortness of breath taking atrovent once daily and albuterol every night. Patient is scheduled for PFT on 09/22/20.  Denies fever wheezing and body aches.   Dr. Erin Fulling please advise.   Allergies  Allergen Reactions  . Codeine     REACTION: itching  . Penicillins     REACTION: UNSPECIFIED    Immunization History  Administered Date(s) Administered  . Influenza Split 08/22/2012  . Influenza,inj,Quad PF,6+ Mos 08/27/2013, 07/01/2014, 09/16/2015, 09/04/2017  . Td 05/10/1999  . Tdap  04/08/2013

## 2020-09-19 ENCOUNTER — Other Ambulatory Visit (HOSPITAL_COMMUNITY): Payer: Self-pay

## 2020-09-29 ENCOUNTER — Telehealth: Payer: Self-pay | Admitting: Pulmonary Disease

## 2020-09-29 NOTE — Telephone Encounter (Signed)
09/29/2020  Per chart review patient was last seen in October/2021 by Dr. Francine Graven.  Is recommended that he continue to use supplemental oxygen for 2 3 L with ambulation.  I would recommend that patient have a walk in office to document that no longer requires oxygen with exertion.  Can schedule patient for simple walk in office to assess for exertional hypoxemia.  Could also schedule for follow-up with Dr. Francine Graven or APP.  Elisha Headland, FNP

## 2020-09-29 NOTE — Telephone Encounter (Signed)
Called and spoke with pt who states it has been about a month since he last used his O2 and due to no longer needing it, pt is wanting Rx to be sent to [facility] so they can pick up his equipment.  Pt is a pt of Dr. Francine Graven who is out of the office. Arlys John, please advise if you would be okay with [place] placing Rx to [facility] to have pt's O2 discontinued.

## 2020-09-29 NOTE — Telephone Encounter (Signed)
Spoke with patient. He stated that he has an appt for a PFT on 10/08/19 and wanted to see if he could do the walk on the same day. I advised him that his insurance will not pay for the PFT and walk on the same day, it had to be on different days. I advised him that Dr. Francine Graven would be back in the office on Monday. He stated that he is ok with waiting until Dr. Francine Graven returns to the office for his opinion.   Dr. Francine Graven, please advise. Thanks!

## 2020-10-02 ENCOUNTER — Other Ambulatory Visit (HOSPITAL_COMMUNITY)
Admission: RE | Admit: 2020-10-02 | Discharge: 2020-10-02 | Disposition: A | Discharge status: Home / Self Care | Payer: Self-pay | Source: Ambulatory Visit | Attending: Pulmonary Disease | Admitting: Pulmonary Disease

## 2020-10-02 DIAGNOSIS — Z20822 Contact with and (suspected) exposure to covid-19: Secondary | ICD-10-CM | POA: Insufficient documentation

## 2020-10-02 DIAGNOSIS — Z01812 Encounter for preprocedural laboratory examination: Secondary | ICD-10-CM | POA: Insufficient documentation

## 2020-10-02 LAB — SARS CORONAVIRUS 2 (TAT 6-24 HRS): SARS Coronavirus 2: NEGATIVE

## 2020-10-05 ENCOUNTER — Encounter: Payer: Self-pay | Admitting: *Deleted

## 2020-10-05 NOTE — Progress Notes (Signed)
SARS-CoV-2 test is negative.  This is good news.  Proceed forward with work-up as planned.  Slayter Moorhouse, FNP 

## 2020-10-06 NOTE — Telephone Encounter (Signed)
We can fit patient in for a 15 minute follow up visit tomorrow 10/08/19 when he comes in for his PFT. If he would like to come in at 12pm for the appointment we can fit him in to to a simple walk to check his oxygen saturations and then he can have his PFT as scheduled later on in the afternoon.   Thanks, Bryan Kirby

## 2020-10-06 NOTE — Telephone Encounter (Signed)
I mean 10/07/2020

## 2020-10-07 ENCOUNTER — Ambulatory Visit (INDEPENDENT_AMBULATORY_CARE_PROVIDER_SITE_OTHER): Payer: HRSA Program | Admitting: Pulmonary Disease

## 2020-10-07 ENCOUNTER — Encounter: Payer: Self-pay | Admitting: Pulmonary Disease

## 2020-10-07 ENCOUNTER — Other Ambulatory Visit: Payer: Self-pay

## 2020-10-07 VITALS — BP 126/84 | HR 93 | Temp 97.3°F | Ht 71.0 in | Wt 279.6 lb

## 2020-10-07 DIAGNOSIS — R0600 Dyspnea, unspecified: Secondary | ICD-10-CM | POA: Diagnosis not present

## 2020-10-07 DIAGNOSIS — R0602 Shortness of breath: Secondary | ICD-10-CM

## 2020-10-07 DIAGNOSIS — U071 COVID-19: Secondary | ICD-10-CM | POA: Diagnosis not present

## 2020-10-07 DIAGNOSIS — J1282 Pneumonia due to coronavirus disease 2019: Secondary | ICD-10-CM

## 2020-10-07 DIAGNOSIS — J310 Chronic rhinitis: Secondary | ICD-10-CM

## 2020-10-07 DIAGNOSIS — R0609 Other forms of dyspnea: Secondary | ICD-10-CM

## 2020-10-07 DIAGNOSIS — R942 Abnormal results of pulmonary function studies: Secondary | ICD-10-CM | POA: Diagnosis not present

## 2020-10-07 LAB — PULMONARY FUNCTION TEST
DL/VA % pred: 111 %
DL/VA: 4.63 ml/min/mmHg/L
DLCO cor % pred: 47 %
DLCO cor: 13.28 ml/min/mmHg
DLCO unc % pred: 47 %
DLCO unc: 13.28 ml/min/mmHg
FEF 25-75 Post: 3.89 L/sec
FEF 25-75 Pre: 3.57 L/sec
FEF2575-%Change-Post: 9 %
FEF2575-%Pred-Post: 131 %
FEF2575-%Pred-Pre: 120 %
FEV1-%Change-Post: 2 %
FEV1-%Pred-Post: 83 %
FEV1-%Pred-Pre: 81 %
FEV1-Post: 3.07 L
FEV1-Pre: 3 L
FEV1FVC-%Change-Post: 0 %
FEV1FVC-%Pred-Pre: 112 %
FEV6-%Change-Post: 2 %
FEV6-%Pred-Post: 77 %
FEV6-%Pred-Pre: 76 %
FEV6-Post: 3.62 L
FEV6-Pre: 3.53 L
FEV6FVC-%Change-Post: 0 %
FEV6FVC-%Pred-Post: 104 %
FEV6FVC-%Pred-Pre: 104 %
FVC-%Change-Post: 2 %
FVC-%Pred-Post: 74 %
FVC-%Pred-Pre: 72 %
FVC-Post: 3.62 L
FVC-Pre: 3.53 L
Post FEV1/FVC ratio: 85 %
Post FEV6/FVC ratio: 100 %
Pre FEV1/FVC ratio: 85 %
Pre FEV6/FVC Ratio: 100 %
RV % pred: 91 %
RV: 2.14 L
TLC % pred: 80 %
TLC: 5.78 L

## 2020-10-07 MED ORDER — FEXOFENADINE HCL 180 MG PO TABS: 180.0000 mg | ORAL_TABLET | Freq: Every day | ORAL | 11 refills | Status: DC

## 2020-10-07 MED ORDER — FLUTICASONE PROPIONATE 50 MCG/ACT NA SUSP: 1.0000 | Freq: Every day | NASAL | 2 refills | Status: DC

## 2020-10-07 MED ORDER — IPRATROPIUM BROMIDE 0.03 % NA SOLN
2.0000 | Freq: Two times a day (BID) | NASAL | 12 refills | Status: DC
Start: 2020-10-07 — End: 2021-01-11

## 2020-10-07 MED ORDER — BREO ELLIPTA 100-25 MCG/INH IN AEPB: 1.0000 | INHALATION_SPRAY | Freq: Every day | RESPIRATORY_TRACT | 0 refills | Status: DC

## 2020-10-07 NOTE — Patient Instructions (Addendum)
Start breo ellipta 100-1mcg 1 puff daily for two weeks  Start flonase nasal spray 1 spray per nostril  Continue ipratropium nasal spray  Start fexofenadine 180mg  daily  Safe to stop supplemental oxygen therapy

## 2020-10-07 NOTE — Telephone Encounter (Signed)
Spoke with patient. He is ok with coming this afternoon before his PFT. He has been scheduled for 215pm and will have his PFT at 4pm.   Nothing further needed at time of call.

## 2020-10-07 NOTE — Progress Notes (Signed)
Synopsis: Referred by Dr. Carolyne Littles, MD  for hospital follow up for covid 19  Subjective:   PATIENT ID: Bryan Kirby DOB: 1956-05-05, MRN: [id number]   HPI  Chief Complaint  Patient presents with  . Follow-up    Patient wants to D/C oxygen. States he last used his O2 back in November 2021. States his breathing has been stable since last visit,.    Bryan Kirby is a 65 year old [place] with history of obstructive sleep apnea and obesity who returns to pulmonary clinic for covid 19 pneumonia and respiratory failure.   At last visit he was continued on 2-3L of oxygen with ambulation. He has since discontinued oxygen use at home as he has been checking his oxygen levels. Today he maintains an oxygen saturation of 96% on room air with ambulation with no desaturations. He is feeling significantly better since last visit but he still has exertional shortness of breath. He continues to experience nasal/sinus drainage with cough and sputum production. He denies wheezing or chest tightness. He has been using the ipratropium nasal spray with out relief. He has been using as needed albuterol with some relief. He has run out of ipratropium inhaler.   Past Medical History:  Diagnosis Date  . Allergic rhinitis due to pollen   . Colon polyps   . DVT (deep venous thrombosis) (HCC) 2000   Right leg  . Family history of adverse reaction to anesthesia    " MY MOTHER "   . GERD (gastroesophageal reflux disease)   . Hyperlipidemia   . 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 Pneumonia   . Sleep apnea    USES CPAP     Family History  Problem Relation Age of Onset  . Fibromyalgia Mother   . Depression Mother   . Cancer Father        PROSTATECTOMY  . Stroke Father   . Cancer Sister        CERVICAL  . Stroke Paternal Grandfather   . Hypertension Paternal Grandfather   . Diabetes Paternal Grandfather       Social History   Socioeconomic History  . Marital status: Married    Spouse name: Not on file  . Number of children: 1  . Years of education: Not on file  . Highest education level: Not on file  Occupational History  . Occupation: PLUMBER  Tobacco Use  . Smoking status: Former Smoker    Packs/day: 0.50    Years: 1.00    Pack years: 0.50    Types: Cigarettes    Quit date: 10/03/1973    Years since quitting: 47.0  . Smokeless tobacco: Never Used  Vaping Use  . Vaping Use: Never used  Substance and Sexual Activity  . Alcohol use: No  . Drug use: No  . Sexual activity: Not on file  Other Topics Concern  . Not on file  Social History Narrative  . Not on file   Social Determinants of Health   Financial Resource Strain: Not on file  Food Insecurity: Not on file  Transportation Needs: Not on file  Physical Activity: Not on file  Stress: Not on file  Social Connections: Not on file  Intimate Partner Violence: Not on file     Allergies  Allergen Reactions  . Codeine     REACTION: itching  . Penicillins Itching    REACTION: UNSPECIFIED  Outpatient Medications Prior to Visit  Medication Sig Dispense Refill  . Acetaminophen (TYLENOL 8 HOUR PO) Take 1,000 mg by mouth once as needed (pain).    Marland Kitchen albuterol (VENTOLIN HFA) 108 (90 Base) MCG/ACT inhaler Inhale 2 puffs into the lungs every 4 (four) hours as needed for wheezing or shortness of breath. 6.7 g 0  . Ascorbic Acid (VITAMIN C) 1000 MG tablet     . cholecalciferol (VITAMIN D) 25 MCG tablet Take 1 tablet (1,000 Units total) by mouth daily. 30 tablet 0  . diclofenac sodium (VOLTAREN) 1 % GEL Apply 4 g topically 4 (four) times daily as needed. Apply to large joint PRN 3 Tube 4  . escitalopram (LEXAPRO) 20 MG tablet TAKE ONE TABLET BY MOUTH DAILY 90 tablet 3  . folic acid (FOLVITE) 1 MG tablet Take 1 tablet (1 mg total) by mouth daily. 30 tablet 0  . ondansetron (ZOFRAN ODT) 4 MG disintegrating tablet Take 1 tablet (4  mg total) by mouth every 8 (eight) hours as needed for nausea or vomiting. 20 tablet 0  . pantoprazole (PROTONIX) 40 MG tablet TAKE ONE TABLET BY MOUTH DAILY 90 tablet 1  . simvastatin (ZOCOR) 40 MG tablet TAKE ONE TABLET BY MOUTH AT BEDTIME 90 tablet 1  . zinc sulfate 220 (50 Zn) MG capsule Take 1 capsule (220 mg total) by mouth daily. 30 capsule 0  . ipratropium (ATROVENT HFA) 17 MCG/ACT inhaler Inhale 2 puffs into the lungs every 6 (six) hours as needed (shortness of breath). 1 each 0  . ipratropium (ATROVENT) 0.03 % nasal spray Place 2 sprays into both nostrils every 12 (twelve) hours. 30 mL 12   No facility-administered medications prior to visit.    Review of Systems  Constitutional: Negative for chills, diaphoresis, fever, malaise/fatigue and weight loss.  HENT: Positive for congestion. Negative for nosebleeds and sore throat.   Respiratory: Positive for cough and shortness of breath. Negative for hemoptysis, sputum production and wheezing.   Cardiovascular: Negative for chest pain, palpitations, orthopnea, claudication, leg swelling and PND.  Gastrointestinal: Negative for abdominal pain, blood in stool, constipation, diarrhea, heartburn, melena, nausea and vomiting.  Genitourinary: Negative for dysuria and hematuria.  Musculoskeletal: Negative for joint pain and myalgias.  Neurological: Negative for weakness and headaches.  Endo/Heme/Allergies: Does not bruise/bleed easily.  Psychiatric/Behavioral: Negative.     Objective:   Vitals:   10/07/20 1430  BP: 126/84  Pulse: 93  Temp: (!) 97.3 F (36.3 C)  TempSrc: Temporal  SpO2: 100%  Weight: 279 lb 9.6 oz (126.8 kg)  Height: 5\' 11"  (1.803 m)    Physical Exam Constitutional:      Appearance: He is obese.  HENT:     Head: Normocephalic and atraumatic.     Nose: Nose normal.     Mouth/Throat:     Mouth: Mucous membranes are moist.     Pharynx: Oropharynx is clear.  Eyes:     General: No scleral icterus.     Conjunctiva/sclera: Conjunctivae normal.     Pupils: Pupils are equal, round, and reactive to light.  Cardiovascular:     Rate and Rhythm: Normal rate and regular rhythm.     Pulses: Normal pulses.     Heart sounds: No murmur heard.   Pulmonary:     Effort: Pulmonary effort is normal.     Breath sounds: No wheezing, rhonchi or rales.  Abdominal:     General: Bowel sounds are normal.     Palpations: Abdomen is soft.  Musculoskeletal:     Cervical back: Neck supple.     Right lower leg: No edema.     Left lower leg: No edema.  Lymphadenopathy:     Cervical: No cervical adenopathy.  Skin:    Findings: No lesion or rash.  Neurological:     General: No focal deficit present.     Mental Status: He is alert and oriented to person, place, and time. Mental status is at baseline.     Gait: Gait normal.  Psychiatric:        Mood and Affect: Mood normal.        Behavior: Behavior normal.        Thought Content: Thought content normal.        Judgment: Judgment normal.    CBC    Component Value Date/Time   WBC 12.0 (H) 07/01/2020 0426   RBC 4.34 07/01/2020 0426   HGB 12.9 (L) 07/01/2020 0426   HCT 39.1 07/01/2020 0426   PLT 422 (H) 07/01/2020 0426   MCV 90.1 07/01/2020 0426   MCH 29.7 07/01/2020 0426   MCHC 33.0 07/01/2020 0426   RDW 13.2 07/01/2020 0426   LYMPHSABS 0.8 07/01/2020 0426   MONOABS 1.0 07/01/2020 0426   EOSABS 0.0 07/01/2020 0426   BASOSABS 0.0 07/01/2020 0426   BMP Latest Ref Rng & Units 07/01/2020 06/30/2020 06/29/2020  Glucose 70 - 99 mg/dL 138(H) 156(H) 125(H)  BUN 8 - 23 mg/dL 24(H) 22 20  Creatinine 0.61 - 1.24 mg/dL 0.90 0.75 0.86  Sodium 135 - 145 mmol/L 134(L) 132(L) 136  Potassium 3.5 - 5.1 mmol/L 4.7 4.9 5.5(H)  Chloride 98 - 111 mmol/L 96(L) 97(L) 98  CO2 22 - 32 mmol/L 30 25 30   Calcium 8.9 - 10.3 mg/dL 8.4(L) 8.5(L) 8.9   Chest imaging: CXR 06/22/20 The heart size and mediastinal contours are within normal limits. Normal pulmonary vascularity.  Patchy interstitial and airspace opacities in the right mid lung and both lung bases. No pleural effusion or pneumothorax. No acute osseous abnormality.  CT Chest 2015 1. Negative for pulmonary embolism or other acute intrathoracic finding. 2. 4 mm pulmonary nodule on the right. Given smoking history, follow-up chest CT at 1 year is recommended. This recommendation follows the consensus statement: Guidelines for Management of Small Pulmonary Nodules Detected on CT Scans: A Statement from the [place] as published in Radiology 2005; 237:395-400  Pulmonary Function Tests 10/08/19 FEV1 3.07L (83%) FVC 3.62L (74%) FEV1/FVC 85 TLC 80% DLCO 47%  Echo: 2015 - Left ventricle: The cavity size was normal. Systolic function was  normal. The estimated ejection fraction was in the range of 60%  to 65%. Wall motion was normal; there were no regional wall  motion abnormalities.  - Left atrium: The atrium was mildly dilated.  - Right ventricle: Systolic function was mildly reduced.  NM Myocardial Stress Test 09/2014 IMPRESSION: 1. Small sized, mild intensity partially fixed defect in the mid and basal inferior wall consistent with a small area of ischemia vs. Variations in attenuation artifact.  2. Normal left ventricular wall motion.  3. Left ventricular ejection fraction 62%  4. Intermediaterisk stress test findings*.     Assessment & Plan:   Pneumonia due to COVID-19 virus  Rhinitis, unspecified type  Dyspnea on exertion  Discussion: Gildardo Lardner is a 65 year old [place] with history of obstructive sleep apnea and obesity who returns to pulmonary clinic for covid 19 pneumonia and respiratory failure.   He has recovered nicely from  his covid 19 pneumonia leading to acute hypoxemic respiratory failure. He does not require supplemental oxgyen therapy anymore as he remained at 96% O2 saturation while walking today in clinic.   He does continue to experience  exertional dyspnea. Pulmonary function testing today shows moderate diffusion defect (47%) with otherwise normal spirometry. His FVC is reduced but this is likely in the setting of his body habitus and obesity. The TLC is 80% but within the normal range for him. 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 We will check a HRCT chest to further evaluate the diffusion defect which can be done in March, 6 months after his covid infection.    For his rhinitis and post-nasal drainage he is to continue ipratropium nasal spray and also add flonase nasal spray daily. He is to start fexofenadine daily. He can also start sinus rinses with a netty pot or neil med. We will also trial him on Breo Ellipta today for the cough and chest congestion.   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   Follow up in 4 months.  Freda Jackson, MD [facility] Office: [phone]  See Amion for Pager Details    Current Outpatient Medications:  .  Acetaminophen (TYLENOL 8 HOUR PO), Take 1,000 mg by mouth once as needed (pain)., Disp: , Rfl:  .  albuterol (VENTOLIN HFA) 108 (90 Base) MCG/ACT inhaler, Inhale 2 puffs into the lungs every 4 (four) hours as needed for wheezing or shortness of breath., Disp: 6.7 g, Rfl: 0 .  Ascorbic Acid (VITAMIN C) 1000 MG tablet, , Disp: , Rfl:  .  cholecalciferol (VITAMIN D) 25 MCG tablet, Take 1 tablet (1,000 Units total) by mouth daily., Disp: 30 tablet, Rfl: 0 .  diclofenac sodium (VOLTAREN) 1 % GEL, Apply 4 g topically 4 (four) times daily as needed. Apply to large joint PRN, Disp: 3 Tube, Rfl: 4 .  escitalopram (LEXAPRO) 20 MG tablet, TAKE ONE TABLET BY MOUTH DAILY, Disp: 90 tablet, Rfl: 3 .  fexofenadine  (ALLEGRA) 180 MG tablet, Take 1 tablet (180 mg total) by mouth daily., Disp: 30 tablet, Rfl: 11 .  fluticasone (FLONASE) 50 MCG/ACT nasal spray, Place 1 spray into both nostrils daily., Disp: 16 g, Rfl: 2 .  folic acid (FOLVITE) 1 MG tablet, Take 1 tablet (1 mg total) by mouth daily., Disp: 30 tablet, Rfl: 0 .  ondansetron (ZOFRAN ODT) 4 MG disintegrating tablet, Take 1 tablet (4 mg total) by mouth every 8 (eight) hours as needed for nausea or vomiting., Disp: 20 tablet, Rfl: 0 .  pantoprazole (PROTONIX) 40 MG tablet, TAKE ONE TABLET BY MOUTH DAILY, Disp: 90 tablet, Rfl: 1 .  simvastatin (ZOCOR) 40 MG tablet, TAKE ONE TABLET BY MOUTH AT BEDTIME, Disp: 90 tablet, Rfl: 1 .  zinc sulfate 220 (50 Zn) MG capsule, Take 1 capsule (220 mg total) by mouth daily., Disp: 30 capsule, Rfl: 0 .  ipratropium (ATROVENT) 0.03 % nasal spray, Place 2 sprays into both nostrils every 12 (twelve) hours., Disp: 30 mL, Rfl: 12

## 2020-10-07 NOTE — Progress Notes (Signed)
Full PFT completed today ? ?

## 2020-10-23 ENCOUNTER — Telehealth: Payer: Self-pay | Admitting: Pulmonary Disease

## 2020-10-23 MED ORDER — ALBUTEROL SULFATE HFA 108 (90 BASE) MCG/ACT IN AERS: 2.0000 | INHALATION_SPRAY | RESPIRATORY_TRACT | 5 refills | Status: DC | PRN

## 2020-10-23 NOTE — Telephone Encounter (Signed)
Refill of pt's albuterol inhaler has been sent to preferred pharmacy for pt. Called and spoke with pt letting him know this had been done and he verbalized understanding. Nothing further needed. °

## 2020-11-11 ENCOUNTER — Telehealth: Payer: Self-pay | Admitting: Pulmonary Disease

## 2020-11-11 NOTE — Telephone Encounter (Signed)
Called and spoke with pt asking if he knew who had tried to call him. Pt stated that he did not as no VM had been left. I stated to pt that I saw that he was scheduled for a CT and asked if he had been made aware of that info and he stated that he had not. I provided pt the date/time of the CT and also address and phone number of [facility] in case he needed to call and reschedule. Nothing further needed.

## 2020-11-15 ENCOUNTER — Other Ambulatory Visit: Payer: Self-pay | Admitting: Family Medicine

## 2020-12-07 ENCOUNTER — Inpatient Hospital Stay: Admission: RE | Admit: 2020-12-07 | Payer: Self-pay | Source: Ambulatory Visit

## 2020-12-28 ENCOUNTER — Other Ambulatory Visit: Payer: Self-pay | Admitting: Family Medicine

## 2020-12-28 DIAGNOSIS — Z79899 Other long term (current) drug therapy: Secondary | ICD-10-CM

## 2020-12-28 DIAGNOSIS — Z125 Encounter for screening for malignant neoplasm of prostate: Secondary | ICD-10-CM

## 2020-12-28 DIAGNOSIS — E785 Hyperlipidemia, unspecified: Secondary | ICD-10-CM

## 2020-12-28 DIAGNOSIS — Z131 Encounter for screening for diabetes mellitus: Secondary | ICD-10-CM

## 2020-12-30 ENCOUNTER — Other Ambulatory Visit: Payer: Self-pay

## 2021-01-04 ENCOUNTER — Other Ambulatory Visit (INDEPENDENT_AMBULATORY_CARE_PROVIDER_SITE_OTHER): Payer: Self-pay

## 2021-01-04 ENCOUNTER — Other Ambulatory Visit: Payer: Self-pay

## 2021-01-04 DIAGNOSIS — E785 Hyperlipidemia, unspecified: Secondary | ICD-10-CM

## 2021-01-04 DIAGNOSIS — Z79899 Other long term (current) drug therapy: Secondary | ICD-10-CM

## 2021-01-04 DIAGNOSIS — Z125 Encounter for screening for malignant neoplasm of prostate: Secondary | ICD-10-CM

## 2021-01-04 DIAGNOSIS — Z131 Encounter for screening for diabetes mellitus: Secondary | ICD-10-CM

## 2021-01-04 LAB — CBC WITH DIFFERENTIAL/PLATELET
Basophils Absolute: 0 10*3/uL (ref 0.0–0.1)
Basophils Relative: 0.5 % (ref 0.0–3.0)
Eosinophils Absolute: 0.1 10*3/uL (ref 0.0–0.7)
Eosinophils Relative: 1.6 % (ref 0.0–5.0)
HCT: 42.6 % (ref 39.0–52.0)
Hemoglobin: 13.8 g/dL (ref 13.0–17.0)
Lymphocytes Relative: 31.9 % (ref 12.0–46.0)
Lymphs Abs: 1.9 10*3/uL (ref 0.7–4.0)
MCHC: 32.5 g/dL (ref 30.0–36.0)
MCV: 85.9 fl (ref 78.0–100.0)
Monocytes Absolute: 0.7 10*3/uL (ref 0.1–1.0)
Monocytes Relative: 11.8 % (ref 3.0–12.0)
Neutro Abs: 3.3 10*3/uL (ref 1.4–7.7)
Neutrophils Relative %: 54.2 % (ref 43.0–77.0)
Platelets: 263 10*3/uL (ref 150.0–400.0)
RBC: 4.96 Mil/uL (ref 4.22–5.81)
RDW: 16.4 % — ABNORMAL HIGH (ref 11.5–15.5)
WBC: 6 10*3/uL (ref 4.0–10.5)

## 2021-01-04 LAB — LIPID PANEL
Cholesterol: 143 mg/dL (ref 0–200)
HDL: 43.5 mg/dL (ref >39.00)
LDL Cholesterol: 69 mg/dL (ref 0–99)
NonHDL: 99.64
Total CHOL/HDL Ratio: 3
Triglycerides: 155 mg/dL — ABNORMAL HIGH (ref 0.0–149.0)
VLDL: 31 mg/dL (ref 0.0–40.0)

## 2021-01-04 LAB — BASIC METABOLIC PANEL
BUN: 14 mg/dL (ref 6–23)
CO2: 30 mEq/L (ref 19–32)
Calcium: 9.2 mg/dL (ref 8.4–10.5)
Chloride: 104 mEq/L (ref 96–112)
Creatinine, Ser: 1.06 mg/dL (ref 0.40–1.50)
GFR: 75.24 mL/min (ref >60.00)
Glucose, Bld: 100 mg/dL — ABNORMAL HIGH (ref 70–99)
Potassium: 4.6 mEq/L (ref 3.5–5.1)
Sodium: 139 mEq/L (ref 135–145)

## 2021-01-04 LAB — HEPATIC FUNCTION PANEL
ALT: 17 U/L (ref 0–53)
AST: 24 U/L (ref 0–37)
Albumin: 4 g/dL (ref 3.5–5.2)
Alkaline Phosphatase: 47 U/L (ref 39–117)
Bilirubin, Direct: 0.1 mg/dL (ref 0.0–0.3)
Total Bilirubin: 0.3 mg/dL (ref 0.2–1.2)
Total Protein: 6.6 g/dL (ref 6.0–8.3)

## 2021-01-04 LAB — HEMOGLOBIN A1C: Hgb A1c MFr Bld: 6.5 % (ref 4.6–6.5)

## 2021-01-05 LAB — PSA, TOTAL WITH REFLEX TO PSA, FREE: PSA, Total: 0.4 ng/mL (ref <=4.0)

## 2021-01-11 ENCOUNTER — Other Ambulatory Visit: Payer: Self-pay

## 2021-01-11 ENCOUNTER — Encounter: Payer: Self-pay | Admitting: Family Medicine

## 2021-01-11 ENCOUNTER — Ambulatory Visit: Payer: Self-pay | Admitting: Family Medicine

## 2021-01-11 VITALS — BP 110/74 | HR 69 | Temp 97.8°F | Ht 71.0 in | Wt 283.5 lb

## 2021-01-11 DIAGNOSIS — Z0000 Encounter for general adult medical examination without abnormal findings: Secondary | ICD-10-CM

## 2021-01-11 DIAGNOSIS — Z1211 Encounter for screening for malignant neoplasm of colon: Secondary | ICD-10-CM

## 2021-01-11 NOTE — Patient Instructions (Signed)
Ivermectin tablets What is this medicine? IVERMECTIN (eye ver MEK tin) is an anti-infective. It is used to treat infections of some parasites. This medicine may be used for other purposes; ask your health care provider or pharmacist if you have questions. COMMON BRAND NAME(S): Stromectol What should I tell my health care provider before I take this medicine? They need to know if you have any of these conditions:  asthma  liver disease  an unusual or allergic reaction to ivermectin, other medicines, foods, dyes, or preservatives  pregnant or trying to get pregnant  breast-feeding How should I use this medicine? Take this medicine by mouth with a full glass of water. Follow the directions on the prescription label. Take this medicine on an empty stomach, at least 30 minutes before or 2 hours after food. Do not take with food. Take your medicine at regular intervals. Do not take your medicine more often than directed. Take all of your medicine as directed even if you think you are better. Do not skip doses or stop your medicine early. Talk to your pediatrician regarding the use of this medicine in children. Special care may be needed. Overdosage: If you think you have taken too much of this medicine contact a poison control center or emergency room at once. NOTE: This medicine is only for you. Do not share this medicine with others. What if I miss a dose? If you miss a dose, take it as soon as you can. If it is almost time for your next dose, take only that dose. Do not take double or extra doses. What may interact with this medicine?  medicines that treat or prevent blood clots like warfarin This list may not describe all possible interactions. Give your health care provider a list of all the medicines, herbs, non-prescription drugs, or dietary supplements you use. Also tell them if you smoke, drink alcohol, or use illegal drugs. Some items may interact with your medicine. What should I  watch for while using this medicine? See your doctor or health care professional for a follow-up visit as directed. You will need to have tests done to check that the infection is cleared. You may need retreatment. Tell your doctor if your symptoms do not improve or if they get worse. Practice good hygiene to prevent infection of others. Wash your hands, scrub your fingernails and shower often. Every day change and launder linens and undergarments. Scrub toilets often and keep floors clean. What side effects may I notice from receiving this medicine? Side effects that you should report to your doctor or health care professional as soon as possible:  allergic reactions like skin rash, itching or hives, swelling of the face, lips, or tongue  breathing problems  changes in vision  chest pain  confusion  eye pain, swelling, redness  fast, irregular heartrate  feeling dizzy, faint  fever  redness, blistering, peeling or loosening of the skin, including inside the mouth  seizures  uncontrolled urination, bowel movements  unusual swelling  unusually weak or tired Side effects that usually do not require medical attention (report to your doctor or health care professional if they continue or are bothersome):  constipation, diarrhea  headache  joint or muscle pain  loss of appetite  nausea, vomiting  stomach pain  tender glands in the neck, armpits, or groin  tremor This list may not describe all possible side effects. Call your doctor for medical advice about side effects. You may report side effects to FDA at  1-800-FDA-1088. Where should I keep my medicine? Keep out of the reach of children. Store at room temperature below 30 degrees C (86 degrees F). Keep container tightly closed. Throw away any unused medicine after the expiration date. NOTE: This sheet is a summary. It may not cover all possible information. If you have questions about this medicine, talk to your  doctor, pharmacist, or health care provider.  2021 Elsevier/Gold Standard (2008-02-07 13:18:45)

## 2021-01-11 NOTE — Progress Notes (Signed)
Bryan Kirby T. Massie Mees, MD, [place] at [facility] [place], [address]  Phone: [phone]  FAX: [phone]  Bryan Kirby - 65 y.o. male  MRN [id number]  Date of Birth: July 27, 1956  Date: 01/11/2021  PCP: Owens Loffler, MD  Referral: Owens Loffler, MD  Chief Complaint  Patient presents with  . Annual Exam    This visit occurred during the SARS-CoV-2 public health emergency.  Safety protocols were in place, including screening questions prior to the visit, additional usage of staff PPE, and extensive cleaning of exam room while observing appropriate contact time as indicated for disinfecting solutions.   Patient Care Team: Owens Loffler, MD as PCP - General (Family Medicine) Subjective:   Bryan Kirby is a 65 y.o. pleasant patient who presents with the following:  Preventative Health Maintenance Visit:  Health Maintenance Summary Reviewed and updated, unless pt declines services.  Tobacco History Reviewed. Alcohol: No concerns, no excessive use Exercise Habits: Some activity, rec at least 30 mins 5 times a week STD concerns: no risk or activity to increase risk Drug Use: None  Colon ref made Covid vacc?  Ivermectin daily.  283 weight  Wt Readings from Last 3 Encounters:  01/11/21 283 lb 8 oz (128.6 kg)  10/07/20 279 lb 9.6 oz (126.8 kg)  07/20/20 260 lb 6.4 oz (118.1 kg)     Health Maintenance  Topic Date Due  . COVID-19 Vaccine (1) Never done  . COLONOSCOPY (Pts 45-33yrs Insurance coverage will need to be confirmed)  07/23/2020  . INFLUENZA VACCINE  05/03/2021  . TETANUS/TDAP  04/09/2023  . Hepatitis C Screening  Completed  . HIV Screening  Completed  . HPV VACCINES  Aged Out   Immunization History  Administered Date(s) Administered  . Influenza Split 08/22/2012  . Influenza,inj,Quad PF,6+ Mos 08/27/2013, 07/01/2014, 09/16/2015, 09/04/2017   . Td 05/10/1999  . Tdap 04/08/2013   Patient Active Problem List   Diagnosis Date Noted  . COVID-19 virus infection 06/19/2020  . OSA (obstructive sleep apnea) 12/20/2013  . Obesity (BMI 30-39.9) 08/27/2013  . 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 HLD (hyperlipidemia) 03/15/2007  . Allergic rhinitis 03/15/2007  . HERNIATED DISC  L1/2 R SIDE 03/15/2007    Past Medical History:  Diagnosis Date  . Allergic rhinitis due to pollen   . Colon polyps   . DVT (deep venous thrombosis) ([facility]) 2000   Right leg  . Family history of adverse reaction to anesthesia    " MY MOTHER "   . GERD (gastroesophageal reflux disease)   . Hyperlipidemia   . 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 Pneumonia   . Sleep apnea    USES CPAP    Past Surgical History:  Procedure Laterality Date  . CARPAL TUNNEL RELEASE Right   . CYSTECTOMY    . EYE SURGERY    . KNEE ARTHROSCOPY     LEFT LAT MENISCUS TEAR  . KNEE SURGERY     LEFT KNEE CAP WIRING AND PATELLAR REATTACHMENT  . LEFT HEART CATHETERIZATION WITH CORONARY ANGIOGRAM N/A 10/02/2014   Procedure: LEFT HEART CATHETERIZATION WITH CORONARY ANGIOGRAM;  Surgeon: Sanda Klein, MD;  Location: [facility] CATH [facility];  Service: Cardiovascular;  Laterality: N/A;  . ORCHIECTOMY     MALDEVELOPEMENT AFTER ORCHITIS  . POLYPECTOMY     OF  PENIS    Family History  Problem Relation Age of Onset  . Fibromyalgia Mother   . Depression Mother   . Cancer Father        PROSTATECTOMY  . Stroke Father   . Cancer Sister        CERVICAL  . Stroke Paternal Grandfather   . Hypertension Paternal Grandfather   . Diabetes Paternal Grandfather     Past Medical History, Surgical History, Social History, Family History, Problem List, Medications, and Allergies have been reviewed and updated if relevant.  Review of Systems: Pertinent positives are listed above.  Otherwise, a full 14  point review of systems has been done in full and it is negative except where it is noted positive.  Objective:   BP 110/74   Pulse 69   Temp 97.8 F (36.6 C) (Temporal)   Ht 5\' 11"  (1.803 m)   Wt 283 lb 8 oz (128.6 kg)   SpO2 97%   BMI 39.54 kg/m  Ideal Body Weight: Weight in (lb) to have BMI = 25: 178.9  Ideal Body Weight: Weight in (lb) to have BMI = 25: 178.9 No exam data present Depression screen [facility] 2/9 01/11/2021 12/19/2018 12/13/2017  Decreased Interest 0 0 0  Down, Depressed, Hopeless 0 1 0  PHQ - 2 Score 0 1 0     GEN: well developed, well nourished, no acute distress Eyes: conjunctiva and lids normal, PERRLA, EOMI ENT: TM clear, nares clear, oral exam WNL Neck: supple, no lymphadenopathy, no thyromegaly, no JVD Pulm: clear to auscultation and percussion, respiratory effort normal CV: regular rate and rhythm, S1-S2, no murmur, rub or gallop, no bruits, peripheral pulses normal and symmetric, no cyanosis, clubbing, edema or varicosities GI: soft, non-tender; no hepatosplenomegaly, masses; active bowel sounds all quadrants GU: deferred Lymph: no cervical, axillary or inguinal adenopathy MSK: gait normal, muscle tone and strength WNL, no joint swelling, effusions, discoloration, crepitus  SKIN: clear, good turgor, color WNL, no rashes, lesions, or ulcerations Neuro: normal mental status, normal strength, sensation, and motion Psych: alert; oriented to person, place and time, normally interactive and not anxious or depressed in appearance.  All labs reviewed with patient. Results for orders placed or performed in visit on 01/04/21  PSA, Total with Reflex to PSA, Free  Result Value Ref Range   PSA, Total 0.4 < OR = 4.0 ng/mL  Hemoglobin A1c  Result Value Ref Range   Hgb A1c MFr Bld 6.5 4.6 - 6.5 %  Basic metabolic panel  Result Value Ref Range   Sodium 139 135 - 145 mEq/L   Potassium 4.6 3.5 - 5.1 mEq/L   Chloride 104 96 - 112 mEq/L   CO2 30 19 - 32 mEq/L    Glucose, Bld 100 (H) 70 - 99 mg/dL   BUN 14 6 - 23 mg/dL   Creatinine, Ser 1.06 0.40 - 1.50 mg/dL   GFR 75.24 >60.00 mL/min   Calcium 9.2 8.4 - 10.5 mg/dL  Hepatic function panel  Result Value Ref Range   Total Bilirubin 0.3 0.2 - 1.2 mg/dL   Bilirubin, Direct 0.1 0.0 - 0.3 mg/dL   Alkaline Phosphatase 47 39 - 117 U/L   AST 24 0 - 37 U/L   ALT 17 0 - 53 U/L   Total Protein 6.6 6.0 - 8.3 g/dL   Albumin 4.0 3.5 - 5.2 g/dL  CBC with Differential/Platelet  Result Value Ref Range   WBC 6.0 4.0 - 10.5 K/uL   RBC 4.96 4.22 -  5.81 Mil/uL   Hemoglobin 13.8 13.0 - 17.0 g/dL   HCT 42.6 39.0 - 52.0 %   MCV 85.9 78.0 - 100.0 fl   MCHC 32.5 30.0 - 36.0 g/dL   RDW 16.4 (H) 11.5 - 15.5 %   Platelets 263.0 150.0 - 400.0 K/uL   Neutrophils Relative % 54.2 43.0 - 77.0 %   Lymphocytes Relative 31.9 12.0 - 46.0 %   Monocytes Relative 11.8 3.0 - 12.0 %   Eosinophils Relative 1.6 0.0 - 5.0 %   Basophils Relative 0.5 0.0 - 3.0 %   Neutro Abs 3.3 1.4 - 7.7 K/uL   Lymphs Abs 1.9 0.7 - 4.0 K/uL   Monocytes Absolute 0.7 0.1 - 1.0 K/uL   Eosinophils Absolute 0.1 0.0 - 0.7 K/uL   Basophils Absolute 0.0 0.0 - 0.1 K/uL  Lipid panel  Result Value Ref Range   Cholesterol 143 0 - 200 mg/dL   Triglycerides 155.0 (H) 0.0 - 149.0 mg/dL   HDL 43.50 >39.00 mg/dL   VLDL 31.0 0.0 - 40.0 mg/dL   LDL Cholesterol 69 0 - 99 mg/dL   Total CHOL/HDL Ratio 3    NonHDL 99.64     Assessment and Plan:     ICD-10-CM   1. Healthcare maintenance  Z00.00   2. Colon cancer screening  Z12.11 Ambulatory referral to Gastroenterology   Work on weight as primary goal  I also tried to convince him to get a Covid vaccine.  His wife's Chiropractor sells Ivermectin, and he has been taking this daily.  I counseled him to stop.  Patient Instructions  Ivermectin tablets What is this medicine? IVERMECTIN (eye ver MEK tin) is an anti-infective. It is used to treat infections of some parasites. This medicine may be used for other  purposes; ask your health care provider or pharmacist if you have questions. COMMON BRAND NAME(S): Stromectol What should I tell my health care provider before I take this medicine? They need to know if you have any of these conditions:  asthma  liver disease  an unusual or allergic reaction to ivermectin, other medicines, foods, dyes, or preservatives  pregnant or trying to get pregnant  breast-feeding How should I use this medicine? Take this medicine by mouth with a full glass of water. Follow the directions on the prescription label. Take this medicine on an empty stomach, at least 30 minutes before or 2 hours after food. Do not take with food. Take your medicine at regular intervals. Do not take your medicine more often than directed. Take all of your medicine as directed even if you think you are better. Do not skip doses or stop your medicine early. Talk to your pediatrician regarding the use of this medicine in children. Special care may be needed. Overdosage: If you think you have taken too much of this medicine contact a poison control center or emergency room at once. NOTE: This medicine is only for you. Do not share this medicine with others. What if I miss a dose? If you miss a dose, take it as soon as you can. If it is almost time for your next dose, take only that dose. Do not take double or extra doses. What may interact with this medicine?  medicines that treat or prevent blood clots like warfarin This list may not describe all possible interactions. Give your health care provider a list of all the medicines, herbs, non-prescription drugs, or dietary supplements you use. Also tell them if you smoke, drink alcohol,  or use illegal drugs. Some items may interact with your medicine. What should I watch for while using this medicine? See your doctor or health care professional for a follow-up visit as directed. You will need to have tests done to check that the infection is  cleared. You may need retreatment. Tell your doctor if your symptoms do not improve or if they get worse. Practice good hygiene to prevent infection of others. Wash your hands, scrub your fingernails and shower often. Every day change and launder linens and undergarments. Scrub toilets often and keep floors clean. What side effects may I notice from receiving this medicine? Side effects that you should report to your doctor or health care professional as soon as possible:  allergic reactions like skin rash, itching or hives, swelling of the face, lips, or tongue  breathing problems  changes in vision  chest pain  confusion  eye pain, swelling, redness  fast, irregular heartrate  feeling dizzy, faint  fever  redness, blistering, peeling or loosening of the skin, including inside the mouth  seizures  uncontrolled urination, bowel movements  unusual swelling  unusually weak or tired Side effects that usually do not require medical attention (report to your doctor or health care professional if they continue or are bothersome):  constipation, diarrhea  headache  joint or muscle pain  loss of appetite  nausea, vomiting  stomach pain  tender glands in the neck, armpits, or groin  tremor This list may not describe all possible side effects. Call your doctor for medical advice about side effects. You may report side effects to FDA at 1-800-FDA-1088. Where should I keep my medicine? Keep out of the reach of children. Store at room temperature below 30 degrees C (86 degrees F). Keep container tightly closed. Throw away any unused medicine after the expiration date. NOTE: This sheet is a summary. It may not cover all possible information. If you have questions about this medicine, talk to your doctor, pharmacist, or health care provider.  2021 Elsevier/Gold Standard (2008-02-07 13:18:45)     Health Maintenance Exam: The patient's preventative maintenance and  recommended screening tests for an annual wellness exam were reviewed in full today. Brought up to date unless services declined.  Counselled on the importance of diet, exercise, and its role in overall health and mortality. The patient's FH and SH was reviewed, including their home life, tobacco status, and drug and alcohol status.  Follow-up in 1 year for physical exam or additional follow-up below.  Follow-up: Return in about 1 year (around 01/11/2022). Or follow-up in 1 year if not noted.  No orders of the defined types were placed in this encounter.  Medications Discontinued During This Encounter  Medication Reason  . albuterol (VENTOLIN HFA) 108 (90 Base) MCG/ACT inhaler Completed Course  . fluticasone furoate-vilanterol (BREO ELLIPTA) 100-25 MCG/INH AEPB Completed Course  . fluticasone (FLONASE) 50 MCG/ACT nasal spray Completed Course  . folic acid (FOLVITE) 1 MG tablet Completed Course  . ipratropium (ATROVENT) 0.03 % nasal spray Completed Course  . ondansetron (ZOFRAN ODT) 4 MG disintegrating tablet Completed Course   Orders Placed This Encounter  Procedures  . Ambulatory referral to Gastroenterology    Signed,  Frederico Hamman T. Nyima Vanacker, MD   Allergies as of 01/11/2021      Reactions   Codeine    REACTION: itching   Penicillins Itching   REACTION: UNSPECIFIED      Medication List       Accurate as of January 11, 2021  9:05 AM. If you have any questions, ask your nurse or doctor.        STOP taking these medications   albuterol 108 (90 Base) MCG/ACT inhaler Commonly known as: VENTOLIN HFA Stopped by: Owens Loffler, MD   Breo Ellipta 100-25 MCG/INH Aepb Generic drug: fluticasone furoate-vilanterol Stopped by: Owens Loffler, MD   fluticasone 50 MCG/ACT nasal spray Commonly known as: FLONASE Stopped by: Owens Loffler, MD   folic acid 1 MG tablet Commonly known as: FOLVITE Stopped by: Owens Loffler, MD   ipratropium 0.03 % nasal spray Commonly known  as: ATROVENT Stopped by: Owens Loffler, MD   ondansetron 4 MG disintegrating tablet Commonly known as: Zofran ODT Stopped by: Owens Loffler, MD     TAKE these medications   diclofenac sodium 1 % Gel Commonly known as: VOLTAREN Apply 4 g topically 4 (four) times daily as needed. Apply to large joint PRN   escitalopram 20 MG tablet Commonly known as: LEXAPRO TAKE ONE TABLET BY MOUTH DAILY   fexofenadine 180 MG tablet Commonly known as: ALLEGRA Take 1 tablet (180 mg total) by mouth daily.   pantoprazole 40 MG tablet Commonly known as: PROTONIX TAKE ONE TABLET BY MOUTH DAILY   simvastatin 40 MG tablet Commonly known as: ZOCOR TAKE ONE TABLET BY MOUTH AT BEDTIME   TYLENOL 8 HOUR PO Take 1,000 mg by mouth once as needed (pain).   vitamin C 1000 MG tablet   Vitamin D3 25 MCG tablet Commonly known as: Vitamin D Take 1 tablet (1,000 Units total) by mouth daily.   zinc sulfate 220 (50 Zn) MG capsule Take 1 capsule (220 mg total) by mouth daily.

## 2021-01-27 ENCOUNTER — Encounter: Payer: Self-pay | Admitting: Gastroenterology

## 2021-02-04 ENCOUNTER — Ambulatory Visit (AMBULATORY_SURGERY_CENTER): Payer: Self-pay | Admitting: *Deleted

## 2021-02-04 ENCOUNTER — Other Ambulatory Visit: Payer: Self-pay

## 2021-02-04 VITALS — Ht 71.0 in | Wt 275.0 lb

## 2021-02-04 DIAGNOSIS — Z86010 Personal history of colonic polyps: Secondary | ICD-10-CM

## 2021-02-04 NOTE — Progress Notes (Signed)

## 2021-02-13 ENCOUNTER — Other Ambulatory Visit: Payer: Self-pay | Admitting: Family Medicine

## 2021-02-19 ENCOUNTER — Other Ambulatory Visit: Payer: Self-pay

## 2021-02-19 ENCOUNTER — Encounter: Payer: Self-pay | Admitting: Gastroenterology

## 2021-02-19 ENCOUNTER — Ambulatory Visit (AMBULATORY_SURGERY_CENTER): Payer: Self-pay | Admitting: Gastroenterology

## 2021-02-19 VITALS — BP 105/68 | HR 58 | Temp 97.8°F | Resp 32 | Ht 71.0 in | Wt 275.0 lb

## 2021-02-19 DIAGNOSIS — D123 Benign neoplasm of transverse colon: Secondary | ICD-10-CM

## 2021-02-19 DIAGNOSIS — Z86010 Personal history of colonic polyps: Secondary | ICD-10-CM

## 2021-02-19 MED ORDER — SODIUM CHLORIDE 0.9 % IV SOLN: 500.0000 mL | Freq: Once | INTRAVENOUS | Status: DC

## 2021-02-19 NOTE — Patient Instructions (Signed)
Handout given: hemorrhoids, Polyps Resume previous diet Continue current medications Await pathology results  YOU HAD AN ENDOSCOPIC PROCEDURE TODAY AT [facility]:   Refer to the procedure report that was given to you for any specific questions about what was found during the examination.  If the procedure report does not answer your questions, please call your gastroenterologist to clarify.  If you requested that your care partner not be given the details of your procedure findings, then the procedure report has been included in a sealed envelope for you to review at your convenience later.  YOU SHOULD EXPECT: Some feelings of bloating in the abdomen. Passage of more gas than usual.  Walking can help get rid of the air that was put into your GI tract during the procedure and reduce the bloating. If you had a lower endoscopy (such as a colonoscopy or flexible sigmoidoscopy) you may notice spotting of blood in your stool or on the toilet paper. If you underwent a bowel prep for your procedure, you may not have a normal bowel movement for a few days.  Please Note:  You might notice some irritation and congestion in your nose or some drainage.  This is from the oxygen used during your procedure.  There is no need for concern and it should clear up in a day or so.  SYMPTOMS TO REPORT IMMEDIATELY:   Following lower endoscopy (colonoscopy or flexible sigmoidoscopy):  Excessive amounts of blood in the stool  Significant tenderness or worsening of abdominal pains  Swelling of the abdomen that is new, acute  Fever of 100F or higher  For urgent or emergent issues, a gastroenterologist can be reached at any hour by calling [phone]. Do not use MyChart messaging for urgent concerns.   DIET:  We do recommend a small meal at first, but then you may proceed to your regular diet.  Drink plenty of fluids but you should avoid alcoholic beverages for 24 hours.  ACTIVITY:  You should  plan to take it easy for the rest of today and you should NOT DRIVE or use heavy machinery until tomorrow (because of the sedation medicines used during the test).    FOLLOW UP: Our staff will call the number listed on your records 48-72 hours following your procedure to check on you and address any questions or concerns that you may have regarding the information given to you following your procedure. If we do not reach you, we will leave a message.  We will attempt to reach you two times.  During this call, we will ask if you have developed any symptoms of COVID 19. If you develop any symptoms (ie: fever, flu-like symptoms, shortness of breath, cough etc.) before then, please call [phone].  If you test positive for Covid 19 in the 2 weeks post procedure, please call and report this information to [place].    If any biopsies were taken you will be contacted by phone or by letter within the next 1-3 weeks.  Please call us at [phone] if you have not heard about the biopsies in 3 weeks.   SIGNATURES/CONFIDENTIALITY: You and/or your care partner have signed paperwork which will be entered into your electronic medical record.  These signatures attest to the fact that that the information above on your After Visit Summary has been reviewed and is understood.  Full responsibility of the confidentiality of this discharge information lies with you and/or your care-partner.

## 2021-02-19 NOTE — Progress Notes (Signed)
Medical history reviewed with no changes noted. VS assessed by N.C 

## 2021-02-19 NOTE — Progress Notes (Signed)
Called to room to assist during endoscopic procedure.  Patient ID and intended procedure confirmed with present staff. Received instructions for my participation in the procedure from the performing physician.  

## 2021-02-19 NOTE — Op Note (Signed)
[facility] Patient Name: Bryan Kirby Procedure Date: 02/19/2021 10:13 AM MRN: [id number] Endoscopist: Remo Lipps P. Havery Moros , MD Age: 65 Referring MD:  Date of Birth: 1955-12-15 Gender: Male Account #: [id number] Procedure:                Colonoscopy Indications:              High risk colon cancer surveillance: Personal                            history of colonic polyps (adenoma removed in                            07/2010) Medicines:                Monitored Anesthesia Care Procedure:                Pre-Anesthesia Assessment:                           - Prior to the procedure, a History and Physical                            was performed, and patient medications and                            allergies were reviewed. The patient's tolerance of                            previous anesthesia was also reviewed. The risks                            and benefits of the procedure and the sedation                            options and risks were discussed with the patient.                            All questions were answered, and informed consent                            was obtained. Prior Anticoagulants: The patient has                            taken no previous anticoagulant or antiplatelet                            agents. ASA Grade Assessment: III - A patient with                            severe systemic disease. After reviewing the risks                            and benefits, the patient was deemed in  satisfactory condition to undergo the procedure.                           After obtaining informed consent, the colonoscope                            was passed under direct vision. Throughout the                            procedure, the patient's blood pressure, pulse, and                            oxygen saturations were monitored continuously. The                            Olympus CF-HQ190 [id number]) Colonoscope was                             introduced through the anus and advanced to the the                            cecum, identified by appendiceal orifice and                            ileocecal valve. The colonoscopy was performed                            without difficulty. The patient tolerated the                            procedure well. The quality of the bowel                            preparation was fair. The ileocecal valve,                            appendiceal orifice, and rectum were photographed. Scope In: 10:25:40 AM Scope Out: 10:44:59 AM Scope Withdrawal Time: 0 hours 10 minutes 19 seconds  Total Procedure Duration: 0 hours 19 minutes 19 seconds  Findings:                 The perianal and digital rectal examinations were                            normal.                           A 4 mm polyp was found in the proximal transverse                            colon. The polyp was sessile. The polyp was removed                            with a cold snare. Resection and retrieval were  complete.                           The colon was redundant.                           A large amount of semi-liquid stool was found in                            the entire colon, making visualization difficult.                            Lavage of the colon was performed using copious                            amounts of sterile water, resulting in clearance                            with adequate visualization in most areas. The                            splenic flexure had residual seeds / corn in that                            area which could not be removed but washed and no                            obvious polyps in that area, although visualization                            somewhat limited due to prep in that area.                           Internal hemorrhoids were found during retroflexion.                           The exam was otherwise without  abnormality. Complications:            No immediate complications. Estimated blood loss:                            Minimal. Estimated Blood Loss:     Estimated blood loss was minimal. Impression:               - Preparation of the colon was fair.                           - One 4 mm polyp in the proximal transverse colon,                            removed with a cold snare. Resected and retrieved.                           - Redundant colon.                           -  Internal hemorrhoids.                           - The examination was otherwise normal. Recommendation:           - Patient has a contact number available                           for emergencies. The signs and symptoms of                            potential delayed complications were discussed with                            the patient. Return to normal activities tomorrow.                            Written discharge instructions were provided to the                            patient.                           - Resume previous diet.                           - Continue present medications.                           - Await pathology results.                           - Consider surveillance exam slightly sooner than                            normally recommended interval due to prep. Most of                            colon fairly well visualized but small pockets of                            residual stool in some areas could not be cleared Remo Lipps P. Havery Moros, MD 02/19/2021 10:51:28 AM This report has been signed electronically.

## 2021-02-19 NOTE — Progress Notes (Signed)
PT taken to PACU. Monitors in place. VSS. Report given to RN. 

## 2021-02-23 ENCOUNTER — Telehealth: Payer: Self-pay | Admitting: *Deleted

## 2021-02-23 NOTE — Telephone Encounter (Signed)
  Follow up Call-  Call back number 02/19/2021  Post procedure Call Back phone  # [phone]  Permission to leave phone message Yes  Some recent data might be hidden     Patient questions:  Do you have a fever, pain , or abdominal swelling? No. Pain Score  0 *  Have you tolerated food without any problems? Yes.    Have you been able to return to your normal activities? Yes.    Do you have any questions about your discharge instructions: Diet   No. Medications  No. Follow up visit  No.  Do you have questions or concerns about your Care? No.  Actions: * If pain score is 4 or above: No action needed, pain <4.  1. Have you developed a fever since your procedure? no  2.   Have you had an respiratory symptoms (SOB or cough) since your procedure? no  3.   Have you tested positive for COVID 19 since your procedure no  4.   Have you had any family members/close contacts diagnosed with the COVID 19 since your procedure? no   If yes to any of these questions please route to Joylene John, RN and Joella Prince, RN

## 2021-02-23 NOTE — Telephone Encounter (Signed)
Follow up call made. 

## 2021-08-30 ENCOUNTER — Telehealth: Payer: Self-pay | Admitting: Pulmonary Disease

## 2021-08-30 NOTE — Telephone Encounter (Signed)
Called and spoke with pt who stated a message has popped up on his cpap machine that states the motor life has exceeded. Looked at [facility] and the Rx is showing that he has had his current machine since 2015 which would make him due for a new machine.  Pt is also due for a f/u last seen 10/2020 and was told to f/u 5 months from that appt. Stated to pt that we need him to be seen for an appt so we can reassess and also stated to him that we could get order taken care of for a new cpap machine if Dr. Erin Fulling is okay with order being placed and he verbalized understanding. Appt scheduled for pt Wed. 11/30. Nothing further needed.

## 2021-09-01 ENCOUNTER — Ambulatory Visit (INDEPENDENT_AMBULATORY_CARE_PROVIDER_SITE_OTHER): Payer: Self-pay | Admitting: Pulmonary Disease

## 2021-09-01 ENCOUNTER — Other Ambulatory Visit: Payer: Self-pay

## 2021-09-01 ENCOUNTER — Encounter: Payer: Self-pay | Admitting: Pulmonary Disease

## 2021-09-01 VITALS — BP 128/64 | HR 67 | Temp 98.1°F | Ht 71.0 in | Wt 288.0 lb

## 2021-09-01 DIAGNOSIS — R0609 Other forms of dyspnea: Secondary | ICD-10-CM

## 2021-09-01 DIAGNOSIS — G4733 Obstructive sleep apnea (adult) (pediatric): Secondary | ICD-10-CM

## 2021-09-01 DIAGNOSIS — R942 Abnormal results of pulmonary function studies: Secondary | ICD-10-CM

## 2021-09-01 NOTE — Patient Instructions (Signed)
We will order you a new CPAP machine and supplies  Follow up in 6 months, if you haven't received a new CPAP machine by then please push the appointment out further.

## 2021-09-01 NOTE — Progress Notes (Signed)
Synopsis: Referred by Dr. Dia Crawford, MD  for hospital follow up for covid 19  Subjective:   PATIENT ID: Bryan Kirby GENDER: male DOB: 1956-08-09, MRN: [id number]  HPI  Chief Complaint  Patient presents with   Follow-up    Pt states that he is still feeling fatigue and cpap seems to be having some issues. Pt states that he feels that he is not getting adequate flow from cpap.    Bryan Kirby is a 65 year old male with history of obstructive sleep apnea and obesity who returns to pulmonary clinic for issues with his CPAP machine.   He started to receive a warning from his CPAP machine about motor life exceeded. He feels the pressure from the machine is not as strong as before.   He continues to have exertional dyspnea, fatigue all the time and giving out quicker with physical activity.   He did not get the HRCT chest ordered at previous visit as he is self pay.   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   Qualifier: Diagnosis of  By: Council Mechanic MD, Hilaria Ota    Pneumonia 06/2020   COVID  with pneumonia   Sleep apnea    USES CPAP     Family History  Problem Relation Age of Onset   Fibromyalgia Mother    Depression Mother    Cancer Father        PROSTATECTOMY   Stroke Father    Cancer Sister        CERVICAL   Stroke Paternal Grandfather    Hypertension Paternal Grandfather    Diabetes Paternal Grandfather    Colon polyps Neg Hx    Colon cancer Neg Hx    Esophageal cancer Neg Hx    Stomach cancer Neg Hx    Rectal cancer Neg Hx      Social History   Socioeconomic History   Marital status: Married    Spouse name: Not on file    Number of children: 1   Years of education: Not on file   Highest education level: Not on file  Occupational History   Occupation: PLUMBER  Tobacco Use   Smoking status: Former    Packs/day: 0.50    Years: 1.00    Pack years: 0.50    Types: Cigarettes    Quit date: 10/03/1973    Years since quitting: 47.9   Smokeless tobacco: Never  Vaping Use   Vaping Use: Never used  Substance and Sexual Activity   Alcohol use: No   Drug use: No   Sexual activity: Not on file  Other Topics Concern   Not on file  Social History Narrative   Not on file   Social Determinants of Health   Financial Resource Strain: Not on file  Food Insecurity: Not on file  Transportation Needs: Not on file  Physical Activity: Not on file  Stress: Not on file  Social Connections: Not on file  Intimate Partner Violence: Not on file     Allergies  Allergen Reactions   Codeine  REACTION: itching   Penicillins Itching    REACTION: UNSPECIFIED     Outpatient Medications Prior to Visit  Medication Sig Dispense Refill   Acetaminophen (TYLENOL 8 HOUR PO) Take 1,000 mg by mouth once as needed (pain).     Ascorbic Acid (VITAMIN C) 1000 MG tablet      aspirin 81 MG chewable tablet Chew 81 mg by mouth daily.     cholecalciferol (VITAMIN D) 25 MCG tablet Take 1 tablet (1,000 Units total) by mouth daily. 30 tablet 0   diclofenac sodium (VOLTAREN) 1 % GEL Apply 4 g topically 4 (four) times daily as needed. Apply to large joint PRN 3 Tube 4   escitalopram (LEXAPRO) 20 MG tablet TAKE ONE TABLET BY MOUTH DAILY 90 tablet 3   fexofenadine (ALLEGRA) 180 MG tablet Take 1 tablet (180 mg total) by mouth daily. 30 tablet 11   pantoprazole (PROTONIX) 40 MG tablet TAKE ONE TABLET BY MOUTH DAILY 90 tablet 3   simvastatin (ZOCOR) 40 MG tablet TAKE ONE TABLET BY MOUTH AT BEDTIME 90 tablet 3   zinc sulfate 220 (50 Zn) MG capsule Take 1 capsule (220 mg total) by mouth daily. 30 capsule 0   ivermectin (STROMECTOL) 3 MG TABS  tablet Take by mouth once. (Patient not taking: Reported on 02/19/2021)     No facility-administered medications prior to visit.    Review of Systems  Constitutional:  Negative for chills, diaphoresis, fever, malaise/fatigue and weight loss.  HENT:  Negative for congestion, nosebleeds and sore throat.   Respiratory:  Positive for shortness of breath. Negative for cough, hemoptysis, sputum production and wheezing.   Cardiovascular:  Negative for chest pain, palpitations, orthopnea, claudication, leg swelling and PND.  Gastrointestinal:  Negative for abdominal pain, blood in stool, constipation, diarrhea, heartburn, melena, nausea and vomiting.  Genitourinary:  Negative for dysuria and hematuria.  Musculoskeletal:  Negative for joint pain and myalgias.  Neurological:  Negative for weakness and headaches.  Endo/Heme/Allergies:  Does not bruise/bleed easily.  Psychiatric/Behavioral: Negative.     Objective:   Vitals:   09/01/21 1056  BP: 128/64  Pulse: 67  Temp: 98.1 F (36.7 C)  TempSrc: Oral  SpO2: 97%  Weight: 288 lb (130.6 kg)  Height: 5\' 11"  (1.803 m)    Physical Exam Constitutional:      Appearance: He is obese.  HENT:     Head: Normocephalic and atraumatic.     Nose: Nose normal.     Mouth/Throat:     Mouth: Mucous membranes are moist.     Pharynx: Oropharynx is clear.  Eyes:     General: No scleral icterus.    Conjunctiva/sclera: Conjunctivae normal.  Cardiovascular:     Rate and Rhythm: Normal rate and regular rhythm.     Pulses: Normal pulses.     Heart sounds: No murmur heard. Pulmonary:     Effort: Pulmonary effort is normal.     Breath sounds: No wheezing, rhonchi or rales.  Musculoskeletal:     Cervical back: Neck supple.     Right lower leg: No edema.     Left lower leg: No edema.  Skin:    Findings: No lesion or rash.  Neurological:     General: No focal deficit present.     Mental Status: He is alert and oriented to person, place, and time. Mental  status is at baseline.     Gait: Gait normal.  Psychiatric:        Mood and Affect: Mood  normal.        Behavior: Behavior normal.        Thought Content: Thought content normal.        Judgment: Judgment normal.   CBC    Component Value Date/Time   WBC 6.0 01/04/2021 0835   RBC 4.96 01/04/2021 0835   HGB 13.8 01/04/2021 0835   HCT 42.6 01/04/2021 0835   PLT 263.0 01/04/2021 0835   MCV 85.9 01/04/2021 0835   MCH 29.7 07/01/2020 0426   MCHC 32.5 01/04/2021 0835   RDW 16.4 (H) 01/04/2021 0835   LYMPHSABS 1.9 01/04/2021 0835   MONOABS 0.7 01/04/2021 0835   EOSABS 0.1 01/04/2021 0835   BASOSABS 0.0 01/04/2021 0835   BMP Latest Ref Rng & Units 01/04/2021 07/01/2020 06/30/2020  Glucose 70 - 99 mg/dL 100(H) 138(H) 156(H)  BUN 6 - 23 mg/dL 14 24(H) 22  Creatinine 0.40 - 1.50 mg/dL 1.06 0.90 0.75  Sodium 135 - 145 mEq/L 139 134(L) 132(L)  Potassium 3.5 - 5.1 mEq/L 4.6 4.7 4.9  Chloride 96 - 112 mEq/L 104 96(L) 97(L)  CO2 19 - 32 mEq/L 30 30 25   Calcium 8.4 - 10.5 mg/dL 9.2 8.4(L) 8.5(L)   Chest imaging: CXR 06/22/20 The heart size and mediastinal contours are within normal limits. Normal pulmonary vascularity. Patchy interstitial and airspace opacities in the right mid lung and both lung bases. No pleural effusion or pneumothorax. No acute osseous abnormality.  CT Chest 2015 1. Negative for pulmonary embolism or other acute intrathoracic finding. 2. 4 mm pulmonary nodule on the right. Given smoking history, follow-up chest CT at 1 year is recommended. This recommendation follows the consensus statement: Guidelines for Management of Small Pulmonary Nodules Detected on CT Scans: A Statement from the [employer] as published in Radiology 2005; 237:395-400  Pulmonary Function Tests 10/08/19 FEV1 3.07L (83%) FVC 3.62L (74%) FEV1/FVC 85 TLC 80% DLCO 47%  Echo: 2015 - Left ventricle: The cavity size was normal. Systolic function was    normal. The estimated ejection  fraction was in the range of 60%    to 65%. Wall motion was normal; there were no regional wall    motion abnormalities.  - Left atrium: The atrium was mildly dilated.  - Right ventricle: Systolic function was mildly reduced.  NM Myocardial Stress Test 09/2014 IMPRESSION: 1. Small sized, mild intensity partially fixed defect in the mid and basal inferior wall consistent with a small area of ischemia vs. Variations in attenuation artifact.   2. Normal left ventricular wall motion.   3. Left ventricular ejection fraction 62%   4. Intermediaterisk stress test findings*.     Assessment & Plan:   OSA (obstructive sleep apnea)  Decreased diffusion capacity  Dyspnea on exertion  Discussion: Bryan Kirby is a 65 year old male with history of obstructive sleep apnea and obesity who returns to pulmonary clinic for issues with his CPAP machine.   We will order patient a new CPAP machine and supplies.  Per information from April 2022 it appears he has been compliant with CPAP use.  He does continue to experience exertional dyspnea. Pulmonary function testing shows moderate diffusion defect (47%) with otherwise normal spirometry. His FVC is reduced but this is likely in the setting of his body habitus and obesity. The TLC is 80% but within the normal range for him. 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  Patient  did not wish to obtain high-resolution CT chest scan for further evaluation.  Follow up in 6 months.  Freda Jackson, MD [facility] Office: [phone]    Current Outpatient Medications:    Acetaminophen (TYLENOL 8 HOUR PO), Take 1,000 mg by mouth once as needed (pain)., Disp: , Rfl:    Ascorbic Acid (VITAMIN C) 1000 MG tablet, , Disp: , Rfl:    aspirin 81 MG chewable tablet, Chew 81 mg by mouth daily., Disp: , Rfl:    cholecalciferol (VITAMIN D) 25  MCG tablet, Take 1 tablet (1,000 Units total) by mouth daily., Disp: 30 tablet, Rfl: 0   diclofenac sodium (VOLTAREN) 1 % GEL, Apply 4 g topically 4 (four) times daily as needed. Apply to large joint PRN, Disp: 3 Tube, Rfl: 4   escitalopram (LEXAPRO) 20 MG tablet, TAKE ONE TABLET BY MOUTH DAILY, Disp: 90 tablet, Rfl: 3   fexofenadine (ALLEGRA) 180 MG tablet, Take 1 tablet (180 mg total) by mouth daily., Disp: 30 tablet, Rfl: 11   pantoprazole (PROTONIX) 40 MG tablet, TAKE ONE TABLET BY MOUTH DAILY, Disp: 90 tablet, Rfl: 3   simvastatin (ZOCOR) 40 MG tablet, TAKE ONE TABLET BY MOUTH AT BEDTIME, Disp: 90 tablet, Rfl: 3   zinc sulfate 220 (50 Zn) MG capsule, Take 1 capsule (220 mg total) by mouth daily., Disp: 30 capsule, Rfl: 0

## 2021-11-25 ENCOUNTER — Ambulatory Visit: Payer: Self-pay

## 2021-11-25 ENCOUNTER — Encounter: Payer: Self-pay | Admitting: Physician Assistant

## 2021-11-25 ENCOUNTER — Other Ambulatory Visit: Payer: Self-pay

## 2021-11-25 ENCOUNTER — Ambulatory Visit (INDEPENDENT_AMBULATORY_CARE_PROVIDER_SITE_OTHER): Payer: Self-pay | Admitting: Physician Assistant

## 2021-11-25 ENCOUNTER — Ambulatory Visit (INDEPENDENT_AMBULATORY_CARE_PROVIDER_SITE_OTHER): Payer: Self-pay

## 2021-11-25 DIAGNOSIS — M19041 Primary osteoarthritis, right hand: Secondary | ICD-10-CM

## 2021-11-25 DIAGNOSIS — M79642 Pain in left hand: Secondary | ICD-10-CM

## 2021-11-25 DIAGNOSIS — M79641 Pain in right hand: Secondary | ICD-10-CM

## 2021-11-25 DIAGNOSIS — M19049 Primary osteoarthritis, unspecified hand: Secondary | ICD-10-CM | POA: Insufficient documentation

## 2021-11-25 DIAGNOSIS — M19042 Primary osteoarthritis, left hand: Secondary | ICD-10-CM

## 2021-11-25 MED ORDER — MELOXICAM 15 MG PO TABS: 15.0000 mg | ORAL_TABLET | Freq: Every day | ORAL | 2 refills | Status: DC

## 2021-11-25 NOTE — Progress Notes (Signed)
Office Visit Note   Patient: Bryan Kirby           Date of Birth: 1956-03-31           MRN: [id number] Visit Date: 11/25/2021              Requested by: Owens Loffler, MD [address] 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  He also complains of stiffness and pain in both of his hands.  He is right-hand dominant.  He is status post right carpal tunnel release by Dr. Durward Fortes 15 years ago.  He did say initially he got good relief from the surgery but now things have returned.  He uses topical Voltaren gel which gives him some relief and uses over-the-counter anti-inflammatories  Assessment & Plan: Visit Diagnoses:  1. Pain in right hand   2. Pain in left hand     Plan: Bilateral hand arthritis.  Also findings consistent with carpal tunnel syndrome.  He has had carpal tunnel release on the right side several years ago but now is having returning of symptoms.  The left side is significantly worse.  He does use his hands for his job.  He has failed treatment with over-the-counter anti-inflammatories and topical Voltaren gel.  I think this is a combination of carpal tunnel as well as arthritis.  Discussed with the patient that I think it would be appropriate to obtain new nerve conduction studies of his hands.  Following this he will follow-up with Dr. Tempie Donning for treatment recommendations.  In the meantime I have given him a prescription for meloxicam  Follow-Up Instructions: No follow-ups on file.   Ortho Exam  Patient is alert, oriented, no adenopathy, well-dressed, normal affect, normal respiratory effort. Examination bilateral hands he  has some soft tissue swelling but no cellulitis or signs of infection.  He has a negative Tinel's on the right mildly positive on the left positive Phalen's finding on the left.  He does have tenderness to palpation throughout his MCP joints and has good strength but it is painful to him.  Pulses are intact brisk capillary refill  Imaging: XR Hand Complete Left  Result Date: 11/25/2021 X-rays of his left hand demonstrate diffuse arthritic changes throughout the MCP joints especially the thumb MCP joint.  He has sclerotic changes as well as joint space narrowing  XR Hand Complete Right  Result Date: 11/25/2021 X-rays of his right hand demonstrate diffuse degenerative changes.  No acute fractures.  He has joint irregularities and sclerosis at the base of the the thumb joint  No images are attached to the encounter.  Labs: Lab Results  Component Value Date   HGBA1C 6.5 01/04/2021   HGBA1C 6.5 (H) 06/22/2020   HGBA1C 6.1 12/30/2019   CRP 0.6 07/01/2020   CRP 0.5 06/30/2020   CRP <0.50 06/29/2020   REPTSTATUS 06/27/2020 FINAL 06/22/2020   CULT  06/22/2020    NO GROWTH 5 DAYS Performed at [facility], [place] [address] [place], [place] [address]      Lab Results  Component Value Date  ALBUMIN 4.0 01/04/2021   ALBUMIN 2.6 (L) 07/01/2020   ALBUMIN 2.8 (L) 06/30/2020    Lab Results  Component Value Date   MG 2.5 (H) 06/25/2020   MG 2.5 (H) 06/24/2020   MG 2.1 06/23/2020   Lab Results  Component Value Date   VD25OH 33 05/06/2009    No results found for: PREALBUMIN CBC EXTENDED Latest Ref Rng & Units 01/04/2021 07/01/2020 06/30/2020  WBC 4.0 - 10.5 K/uL 6.0 12.0(H) 14.7(H)  RBC 4.22 - 5.81 Mil/uL 4.96 4.34 4.79  HGB 13.0 - 17.0 g/dL 13.8 12.9(L) 14.2  HCT 39.0 - 52.0 % 42.6 39.1 43.4  PLT 150.0 - 400.0 K/uL 263.0 422(H) 582(H)  NEUTROABS 1.4 - 7.7 K/uL 3.3 9.7(H) 12.4(H)  LYMPHSABS 0.7 - 4.0 K/uL 1.9 0.8 0.5(L)     There is no height or weight on file to calculate  BMI.  Orders:  Orders Placed This Encounter  Procedures   XR Hand Complete Right   XR Hand Complete Left   Meds ordered this encounter  Medications   meloxicam (MOBIC) 15 MG tablet    Sig: Take 1 tablet (15 mg total) by mouth daily.    Dispense:  30 tablet    Refill:  2     Procedures: No procedures performed  Clinical Data: No additional findings.  ROS:  All other systems negative, except as noted in the HPI. Review of Systems  Objective: Vital Signs: There were no vitals taken for this visit.  Specialty Comments:  No specialty comments available.  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 03/15/2007   Past Medical History:  Diagnosis Date   Allergic rhinitis due to pollen    Anxiety    Arthritis    Colon polyps    COVID-19 06/2020   DVT (deep venous thrombosis) ([place]) 2000   Right leg   Family history of adverse reaction to anesthesia    " MY MOTHER "    GERD (gastroesophageal reflux disease)    Hyperlipidemia    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   Qualifier: Diagnosis of  By: Council Mechanic MD, Hilaria Ota    Pneumonia 06/2020   COVID  with pneumonia   Sleep apnea    USES CPAP    Family History  Problem Relation Age of Onset   Fibromyalgia Mother    Depression Mother    Cancer Father        PROSTATECTOMY   Stroke Father    Cancer Sister        CERVICAL   Stroke Paternal Grandfather    Hypertension Paternal Grandfather    Diabetes Paternal Grandfather    Colon polyps Neg Hx    Colon cancer Neg Hx    Esophageal cancer Neg Hx    Stomach cancer Neg Hx    Rectal cancer Neg Hx     Past Surgical History:  Procedure Laterality Date   CARPAL  TUNNEL RELEASE Right    COLONOSCOPY     CYSTECTOMY     EYE SURGERY     KNEE ARTHROSCOPY     LEFT LAT MENISCUS TEAR   KNEE SURGERY     LEFT KNEE CAP WIRING AND PATELLAR REATTACHMENT  LEFT HEART CATHETERIZATION WITH CORONARY ANGIOGRAM N/A 10/02/2014   Procedure: LEFT HEART CATHETERIZATION WITH CORONARY ANGIOGRAM;  Surgeon: Sanda Klein, MD;  Location: [facility];  Service: Cardiovascular;  Laterality: N/A;   ORCHIECTOMY     MALDEVELOPEMENT AFTER ORCHITIS   POLYPECTOMY     OF PENIS   Social History   Occupational History   Occupation: PLUMBER  Tobacco Use   Smoking status: Former    Packs/day: 0.50    Years: 1.00    Pack years: 0.50    Types: Cigarettes    Quit date: 10/03/1973    Years since quitting: 48.1   Smokeless tobacco: Never  Vaping Use   Vaping Use: Never used  Substance and Sexual Activity   Alcohol use: No   Drug use: No   Sexual activity: Not on file

## 2021-11-30 ENCOUNTER — Telehealth: Payer: Self-pay | Admitting: Pulmonary Disease

## 2021-11-30 DIAGNOSIS — G4733 Obstructive sleep apnea (adult) (pediatric): Secondary | ICD-10-CM

## 2021-11-30 NOTE — Telephone Encounter (Signed)
Spoke to patient. He is calling for update on cpap machine.  It does not look like machine was ordered during last OV, although is was mentioned in AVS.  Dr. Erin Fulling, please verify settings? Thanks

## 2021-11-30 NOTE — Telephone Encounter (Signed)
CPAP auto-titrating 5-15cmH2O with humidification. Unless patient is able to specific his current settings or we could get a download of his recent usage.  Thanks, JD

## 2021-12-01 NOTE — Telephone Encounter (Signed)
Order has been placed for pt to receive a new cpap machine. Called and spoke with pt letting him know that this was taken care of and he verbalized understanding. Nothing further needed. ?

## 2021-12-14 ENCOUNTER — Encounter: Payer: Self-pay | Admitting: Physical Medicine and Rehabilitation

## 2021-12-14 ENCOUNTER — Other Ambulatory Visit: Payer: Self-pay

## 2021-12-14 ENCOUNTER — Ambulatory Visit (INDEPENDENT_AMBULATORY_CARE_PROVIDER_SITE_OTHER): Payer: Self-pay | Admitting: Physical Medicine and Rehabilitation

## 2021-12-14 DIAGNOSIS — R202 Paresthesia of skin: Secondary | ICD-10-CM

## 2021-12-14 NOTE — Progress Notes (Signed)
Pt state pain and numbness in both hands. Pt state it hard for him to hold on to items. Pt state hx of right hand surgery. Pt state he takes over the counter pain meds and takes fluid pills to help his swelling. Pt state he right handed. ? ?Numeric Pain Rating Scale and Functional Assessment ?Average Pain 2 ? ? ?In the last MONTH (on 0-10 scale) has pain interfered with the following? ? ?1. General activity like being  able to carry out your everyday physical activities such as walking, climbing stairs, carrying groceries, or moving a chair?  ?Rating(8) ? ? ? ? ?

## 2021-12-19 NOTE — Progress Notes (Signed)
? ?Bryan Bryan - 66 y.o. male MRN [id number]  Date of birth: 06/24/1956 ? ?Office Visit Note: ?Visit Date: 12/14/2021 ?PCP: Owens Loffler, MD ?Referred by: Persons, Bevely Palmer, [place] ? ?Subjective: ?Chief Complaint  ?Patient presents with  ? Right Hand - Pain  ? Left Hand - Pain  ? ?HPI:  Bryan Bryan is a 66 y.o. male who comes in today at the request of Sneads, PA-C for electrodiagnostic study of the Bilateral upper extremities.  Patient is Right hand dominant. He reports chronic worsening bilateral hand pain and numbness globally. He reports swelling. He has a remote history of right carpal tunnel release. No prior electrodiagnostic studies to review. He denies diabetes or frank radicular symptoms. ? ?ROS Otherwise per HPI. ? ?Assessment & Plan: ?Visit Diagnoses:  ?  ICD-10-CM   ?1. Paresthesia of skin  R20.2 NCV with EMG (electromyography)  ?  ?  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 The right side is most likely recurrent.  ? ?There is no significant electrodiagnostic evidence of any other focal nerve entrapment, brachial plexopathy or cervical radiculopathy.  ? ?Recommendations: ?1.  Follow-up with referring physician. ?2.  Continue current management of symptoms. ?3.  Suggest surgical evaluation. ? ?Meds & Orders: No orders of the defined types were placed in this encounter. ?  ?Orders Placed This Encounter  ?Procedures  ? NCV with EMG (electromyography)  ?  ?Follow-up: Return in about 2 weeks (around 12/28/2021) for Coffee, PA-C.  ? ?Procedures: ?No procedures performed  ?EMG & NCV Findings: ?Evaluation of the left median motor and the right median motor nerves showed prolonged distal onset latency (L5.1, R4.3 ms) and decreased conduction velocity (Elbow-Wrist, L48, R46 m/s).  The right ulnar motor nerve  showed decreased conduction velocity (B Elbow-Wrist, 52 m/s).  The left median (across palm) sensory nerve showed no response (Palm) and prolonged distal peak latency (4.7 ms).  The right median (across palm) sensory nerve showed prolonged distal peak latency (Wrist, 4.2 ms).  The left ulnar sensory nerve showed prolonged distal peak latency (4.3 ms) and decreased conduction velocity (Wrist-5th Digit, 33 m/s).  The right ulnar sensory nerve showed prolonged distal peak latency (4.4 ms), reduced amplitude (11.6 ?V), and decreased conduction velocity (Wrist-5th Digit, 32 m/s).  All remaining nerves (as indicated in the following tables) were within normal limits.  Left vs. Right side comparison data for the median motor nerve indicates abnormal L-R latency difference (0.8 ms).  The ulnar motor nerve indicates abnormal L-R velocity difference (A Elbow-B Elbow, 21 m/s).  All remaining left vs. right side differences were within normal limits.   ? ?All examined muscles (as indicated in the following table) showed no evidence of electrical instability.   ? 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 The right side is most likely recurrent.  ? ?There is no significant electrodiagnostic evidence of any other focal nerve entrapment, brachial plexopathy or cervical radiculopathy.  ? ?Recommendations: ?1.  Follow-up with referring physician. ?2.  Continue current management of symptoms. ?3.  Suggest surgical evaluation. ? ?___________________________ ?Laurence Spates FAAPMR ?Board Certified, Tax adviser of Physical Medicine and Rehabilitation ? ? ? ?Nerve Conduction Studies ?Anti Sensory Summary Table ? ? Stim Site NR Peak (  ms) Norm Peak (ms) P-T Amp (?V) Norm P-T Amp Site1 Site2 Delta-P (ms) Dist (cm) Vel (m/s) Norm Vel (m/s)  ?Left Median Acr Palm Anti Sensory (2nd  Digit)  30.6?C  ?Wrist    *4.7 <3.6 24.6 >10 Wrist Palm  0.0    ?Palm *NR  <2.0          ?Right Median Acr Palm Anti Sensory (2nd Digit)  29.8?C  ?Wrist    *4.2 <3.6 22.7 >10 Wrist Palm 2.2 0.0    ?Palm    2.0 <2.0 6.6         ?Left Radial Anti Sensory (Base 1st Digit)  30.1?C  ?Wrist    1.9 <3.1 28.5  Wrist Base 1st Digit 1.9 0.0    ?Right Radial Anti Sensory (Base 1st Digit)  30.2?C  ?Wrist    1.9 <3.1 25.0  Wrist Base 1st Digit 1.9 0.0    ?Left Ulnar Anti Sensory (5th Digit)  30.7?C  ?Wrist    *4.3 <3.7 21.2 >15.0 Wrist 5th Digit 4.3 14.0 *33 >38  ?Right Ulnar Anti Sensory (5th Digit)  30?C  ?Wrist    *4.4 <3.7 *11.6 >15.0 Wrist 5th Digit 4.4 14.0 *32 >38  ? ?Motor Summary Table ? ? Stim Site NR Onset (ms) Norm Onset (ms) O-P Amp (mV) Norm O-P Amp Site1 Site2 Delta-0 (ms) Dist (cm) Vel (m/s) Norm Vel (m/s)  ?Left Median Motor (Abd Poll Brev)  30.2?C  ?Wrist    *5.1 <4.2 6.4 >5 Elbow Wrist 4.4 21.0 *48 >50  ?Elbow    9.5  5.8         ?Right Median Motor (Abd Poll Brev)  30.4?C  ?Wrist    *4.3 <4.2 5.1 >5 Elbow Wrist 4.8 22.0 *46 >50  ?Elbow    9.1  4.6         ?Left Ulnar Motor (Abd Dig Min)  30.3?C  ?Wrist    3.8 <4.2 10.0 >3 B Elbow Wrist 3.9 23.0 59 >53  ?B Elbow    7.7  9.2  A Elbow B Elbow 1.4 11.0 79 >53  ?A Elbow    9.1  9.5         ?Right Ulnar Motor (Abd Dig Min)  30.5?C  ?Wrist    3.8 <4.2 11.5 >3 B Elbow Wrist 4.1 21.5 *52 >53  ?B Elbow    7.9  10.7  A Elbow B Elbow 1.1 11.0 100 >53  ?A Elbow    9.0  11.1         ? ?EMG ? ? Side Muscle Nerve Root Ins Act Fibs Psw Amp Dur Poly Recrt Int Fraser Din Comment  ?Left Abd Poll Brev Median C8-T1 Nml Nml Nml Nml Nml 0 Nml Nml   ?Left 1stDorInt Ulnar C8-T1 Nml Nml Nml Nml Nml 0 Nml Nml   ?Left PronatorTeres Median C6-7 Nml Nml Nml Nml Nml 0 Nml Nml   ?Left Biceps Musculocut C5-6 Nml Nml Nml Nml Nml 0 Nml Nml   ?Left Deltoid Axillary C5-6 Nml Nml Nml Nml Nml 0 Nml Nml   ? ? ?Nerve Conduction Studies ?Anti Sensory Left/Right Comparison ? ? Stim Site L Lat (ms) R Lat (ms)  L-R Lat (ms) L Amp (?V) R Amp (?V) L-R Amp (%) Site1 Site2 L Vel (m/s) R Vel (m/s) L-R Vel (m/s)  ?Median Acr Palm Anti Sensory (2nd Digit)  30.6?C  ?Wrist *4.7 *4.2 0.5 24.6 22.7 7.7 Wrist Palm     ?Palm  2.0   6.6        ?  Radial Anti Sensory (Base 1st Digit)  30.1?C  ?Wrist 1.9 1.9 0.0 28.5 25.0 12.3 Wrist Base 1st Digit     ?Ulnar Anti Sensory (5th Digit)  30.7?C  ?Wrist *4.3 *4.4 0.1 21.2 *11.6 45.3 Wrist 5th Digit *33 *32 1  ? ?Motor Left/Right Comparison ? ? Stim Site L Lat (ms) R Lat (ms) L-R Lat (ms) L Amp (mV) R Amp (mV) L-R Amp (%) Site1 Site2 L Vel (m/s) R Vel (m/s) L-R Vel (m/s)  ?Median Motor (Abd Poll Brev)  30.2?C  ?Wrist *5.1 *4.3 *0.8 6.4 5.1 20.3 Elbow Wrist *48 *46 2  ?Elbow 9.5 9.1 0.4 5.8 4.6 20.7       ?Ulnar Motor (Abd Dig Min)  30.3?C  ?Wrist 3.8 3.8 0.0 10.0 11.5 13.0 B Elbow Wrist 59 *52 7  ?B Elbow 7.7 7.9 0.2 9.2 10.7 14.0 A Elbow B Elbow 79 100 *21  ?A Elbow 9.1 9.0 0.1 9.5 11.1 14.4       ? ? ? ?Waveforms: ?    ? ?    ? ?    ? ?  ? ?  ? ?Clinical History: ?No specialty comments available.  ? ? ? ?Objective:  VS:  HT:    WT:   BMI:     BP:   HR: bpm  TEMP: ( )  RESP:  ?Physical Exam ?Musculoskeletal:     ?   General: No tenderness.  ?   Comments: Inspection reveals no atrophy of the bilateral APB or FDI or hand intrinsics. There is no swelling, color changes, allodynia or dystrophic changes. There is 5 out of 5 strength in the bilateral wrist extension, finger abduction and long finger flexion. There is intact sensation to light touch in all dermatomal and peripheral nerve distributions. There is a positive Phalen's test bilaterally. There is a negative Hoffmann's test bilaterally.  ?Skin: ?   General: Skin is warm and dry.  ?   Findings: No erythema or rash.  ?Neurological:  ?   General: No focal deficit present.  ?   Mental Status: He is alert and oriented to person, place, and time.  ?   Sensory: No sensory deficit.  ?   Motor: No weakness or abnormal muscle tone.  ?    Coordination: Coordination normal.  ?   Gait: Gait normal.  ?Psychiatric:     ?   Mood and Affect: Mood normal.     ?   Behavior: Behavior normal.     ?   Thought Content: Thought content normal.  ?  ? ?Imaging: ?No resu

## 2021-12-19 NOTE — Procedures (Signed)
EMG & NCV Findings: ?Evaluation of the left median motor and the right median motor nerves showed prolonged distal onset latency (L5.1, R4.3 ms) and decreased conduction velocity (Elbow-Wrist, L48, R46 m/s).  The right ulnar motor nerve showed decreased conduction velocity (B Elbow-Wrist, 52 m/s).  The left median (across palm) sensory nerve showed no response (Palm) and prolonged distal peak latency (4.7 ms).  The right median (across palm) sensory nerve showed prolonged distal peak latency (Wrist, 4.2 ms).  The left ulnar sensory nerve showed prolonged distal peak latency (4.3 ms) and decreased conduction velocity (Wrist-5th Digit, 33 m/s).  The right ulnar sensory nerve showed prolonged distal peak latency (4.4 ms), reduced amplitude (11.6 ?V), and decreased conduction velocity (Wrist-5th Digit, 32 m/s).  All remaining nerves (as indicated in the following tables) were within normal limits.  Left vs. Right side comparison data for the median motor nerve indicates abnormal L-R latency difference (0.8 ms).  The ulnar motor nerve indicates abnormal L-R velocity difference (A Elbow-B Elbow, 21 m/s).  All remaining left vs. right side differences were within normal limits.   ? ?All examined muscles (as indicated in the following table) showed no evidence of electrical instability.   ? 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 The right side is most likely recurrent.  ? ?There is no significant electrodiagnostic evidence of any other focal nerve entrapment, brachial plexopathy or cervical radiculopathy.  ? ?Recommendations: ?1.  Follow-up with referring physician. ?2.  Continue current management of symptoms. ?3.  Suggest surgical evaluation. ? ?___________________________ ?Bryan Kirby FAAPMR ?Board Certified, Tax adviser of Physical Medicine and  Rehabilitation ? ? ? ?Nerve Conduction Studies ?Anti Sensory Summary Table ? ? Stim Site NR Peak (ms) Norm Peak (ms) P-T Amp (?V) Norm P-T Amp Site1 Site2 Delta-P (ms) Dist (cm) Vel (m/s) Norm Vel (m/s)  ?Left Median Acr Palm Anti Sensory (2nd Digit)  30.6?C  ?Wrist    *4.7 <3.6 24.6 >10 Wrist Palm  0.0    ?Palm *NR  <2.0          ?Right Median Acr Palm Anti Sensory (2nd Digit)  29.8?C  ?Wrist    *4.2 <3.6 22.7 >10 Wrist Palm 2.2 0.0    ?Palm    2.0 <2.0 6.6         ?Left Radial Anti Sensory (Base 1st Digit)  30.1?C  ?Wrist    1.9 <3.1 28.5  Wrist Base 1st Digit 1.9 0.0    ?Right Radial Anti Sensory (Base 1st Digit)  30.2?C  ?Wrist    1.9 <3.1 25.0  Wrist Base 1st Digit 1.9 0.0    ?Left Ulnar Anti Sensory (5th Digit)  30.7?C  ?Wrist    *4.3 <3.7 21.2 >15.0 Wrist 5th Digit 4.3 14.0 *33 >38  ?Right Ulnar Anti Sensory (5th Digit)  30?C  ?Wrist    *4.4 <3.7 *11.6 >15.0 Wrist 5th Digit 4.4 14.0 *32 >38  ? ?Motor Summary Table ? ? Stim Site NR Onset (ms) Norm Onset (ms) O-P Amp (mV) Norm O-P Amp Site1 Site2 Delta-0 (ms) Dist (cm) Vel (m/s) Norm Vel (m/s)  ?Left Median Motor (Abd Poll Brev)  30.2?C  ?Wrist    *5.1 <4.2 6.4 >5 Elbow Wrist 4.4 21.0 *48 >50  ?Elbow    9.5  5.8         ?Right Median Motor (Abd Poll Brev)  30.4?C  ?  Wrist    *4.3 <4.2 5.1 >5 Elbow Wrist 4.8 22.0 *46 >50  ?Elbow    9.1  4.6         ?Left Ulnar Motor (Abd Dig Min)  30.3?C  ?Wrist    3.8 <4.2 10.0 >3 B Elbow Wrist 3.9 23.0 59 >53  ?B Elbow    7.7  9.2  A Elbow B Elbow 1.4 11.0 79 >53  ?A Elbow    9.1  9.5         ?Right Ulnar Motor (Abd Dig Min)  30.5?C  ?Wrist    3.8 <4.2 11.5 >3 B Elbow Wrist 4.1 21.5 *52 >53  ?B Elbow    7.9  10.7  A Elbow B Elbow 1.1 11.0 100 >53  ?A Elbow    9.0  11.1         ? ?EMG ? ? Side Muscle Nerve Root Ins Act Fibs Psw Amp Dur Poly Recrt Int Fraser Din Comment  ?Left Abd Poll Brev Median C8-T1 Nml Nml Nml Nml Nml 0 Nml Nml   ?Left 1stDorInt Ulnar C8-T1 Nml Nml Nml Nml Nml 0 Nml Nml   ?Left PronatorTeres Median C6-7 Nml Nml Nml  Nml Nml 0 Nml Nml   ?Left Biceps Musculocut C5-6 Nml Nml Nml Nml Nml 0 Nml Nml   ?Left Deltoid Axillary C5-6 Nml Nml Nml Nml Nml 0 Nml Nml   ? ? ?Nerve Conduction Studies ?Anti Sensory Left/Right Comparison ? ? Stim Site L Lat (ms) R Lat (ms) L-R Lat (ms) L Amp (?V) R Amp (?V) L-R Amp (%) Site1 Site2 L Vel (m/s) R Vel (m/s) L-R Vel (m/s)  ?Median Acr Palm Anti Sensory (2nd Digit)  30.6?C  ?Wrist *4.7 *4.2 0.5 24.6 22.7 7.7 Wrist Palm     ?Palm  2.0   6.6        ?Radial Anti Sensory (Base 1st Digit)  30.1?C  ?Wrist 1.9 1.9 0.0 28.5 25.0 12.3 Wrist Base 1st Digit     ?Ulnar Anti Sensory (5th Digit)  30.7?C  ?Wrist *4.3 *4.4 0.1 21.2 *11.6 45.3 Wrist 5th Digit *33 *32 1  ? ?Motor Left/Right Comparison ? ? Stim Site L Lat (ms) R Lat (ms) L-R Lat (ms) L Amp (mV) R Amp (mV) L-R Amp (%) Site1 Site2 L Vel (m/s) R Vel (m/s) L-R Vel (m/s)  ?Median Motor (Abd Poll Brev)  30.2?C  ?Wrist *5.1 *4.3 *0.8 6.4 5.1 20.3 Elbow Wrist *48 *46 2  ?Elbow 9.5 9.1 0.4 5.8 4.6 20.7       ?Ulnar Motor (Abd Dig Min)  30.3?C  ?Wrist 3.8 3.8 0.0 10.0 11.5 13.0 B Elbow Wrist 59 *52 7  ?B Elbow 7.7 7.9 0.2 9.2 10.7 14.0 A Elbow B Elbow 79 100 *21  ?A Elbow 9.1 9.0 0.1 9.5 11.1 14.4       ? ? ? ?Waveforms: ?    ? ?    ? ?    ? ?  ? ? ?

## 2021-12-20 ENCOUNTER — Other Ambulatory Visit: Payer: Self-pay

## 2021-12-20 DIAGNOSIS — R202 Paresthesia of skin: Secondary | ICD-10-CM

## 2021-12-20 DIAGNOSIS — M19041 Primary osteoarthritis, right hand: Secondary | ICD-10-CM

## 2021-12-31 ENCOUNTER — Ambulatory Visit (INDEPENDENT_AMBULATORY_CARE_PROVIDER_SITE_OTHER): Payer: Self-pay | Admitting: Orthopedic Surgery

## 2021-12-31 ENCOUNTER — Encounter: Payer: Self-pay | Admitting: Orthopedic Surgery

## 2021-12-31 DIAGNOSIS — G5603 Carpal tunnel syndrome, bilateral upper limbs: Secondary | ICD-10-CM

## 2021-12-31 NOTE — Progress Notes (Signed)
? ?Office Visit Note ?  ?Patient: Bryan Kirby           ?Date of Birth: April 19, 1956           ?MRN: [id number] ?Visit Date: 12/31/2021 ?             ?Requested by: Persons, Bevely Palmer, [place] ?[address] ?[place],  [place] [address] ?PCP: Owens Loffler, MD ? ? ?Assessment & Plan: ?Visit Diagnoses:  ?1. Bilateral carpal tunnel syndrome   ? ? ?Plan: We reviewed the nature of carpal tunnel syndrome as well as diagnosis, prognosis, and both conservative and surgical treatment options.  We discussed the risks of surgical treatment including bleeding, infection, damage to neurovascular structures, persistent symptoms, delayed wound healing, need for additional surgery.  Only his left side is really bothersome to him.  He would like to proceed with left carpal tunnel release but would like to wait until after his wife has recovered from upcoming back surgery.  He will call the office when he is ready to schedule surgery. ? ?Follow-Up Instructions: No follow-ups on file.  ? ?Orders:  ?No orders of the defined types were placed in this encounter. ? ?No orders of the defined types were placed in this encounter. ? ? ? ? Procedures: ?No procedures performed ? ? ?Clinical Data: ?No additional findings. ? ? ?Subjective: ?Chief Complaint  ?Patient presents with  ? Right Hand - Follow-up  ? ? ?This is a 66 year old right-hand-dominant male who works as a Conservation officer, historic buildings and presents with bilateral hand numbness and tingling.  He had a right carpal tunnel release 15 years ago and this side is minimally bothersome to him.  His big issue and his reason for presenting today is his left side.  He describes numbness and tingling in all of his fingers on the left hand for the last year or 2.  It is progressively worsened.  He says he gets numbness with certain activities such as holding a phone or when driving.  He has mild numbness currently.  He does not think he has any nocturnal symptoms.  He had no treatment of this left side.   He recent underwent EMG/nerve conduction study on 12/14/2021 which demonstrated moderate bilateral carpal tunnel syndrome.  He has no history of diabetes, hypothyroidism, wrist trauma, cervical spine issue, or inflammatory arthropathy. ? ? ?Review of Systems ? ? ?Objective: ?Vital Signs: There were no vitals taken for this visit. ? ?Physical Exam ?Constitutional:   ?   Appearance: Normal appearance.  ?Cardiovascular:  ?   Rate and Rhythm: Normal rate.  ?   Pulses: Normal pulses.  ?Skin: ?   General: Skin is warm and dry.  ?   Capillary Refill: Capillary refill takes less than 2 seconds.  ?Neurological:  ?   Mental Status: He is alert.  ? ? ?Left Hand Exam  ? ?Tenderness  ?The patient is experiencing no tenderness.  ? ?Muscle Strength  ?The patient has normal left wrist strength. ? ?Other  ?Erythema: absent ?Sensation: normal ?Pulse: present ? ?Comments:  + Tinel at wrist.  + Phalen sign after 10-15 seconds.  5/5 thenar motor strength without atrophy.  ? ? ? ? ?Specialty Comments:  ?No specialty comments available. ? ?Imaging: ?No results found. ? ? ?PMFS History: ?Patient Active Problem List  ? Diagnosis Date Noted  ? Bilateral carpal tunnel syndrome 12/31/2021  ? Arthritis of hand, degenerative 11/25/2021  ? COVID-19 virus infection 06/19/2020  ? OSA (obstructive sleep apnea) 12/20/2013  ?  Obesity (BMI 30-39.9) 08/27/2013  ? 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 HLD (hyperlipidemia) 03/15/2007  ? Allergic rhinitis 03/15/2007  ? 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 ?Past Medical History:  ?Diagnosis Date  ? Allergic rhinitis due to pollen   ? Anxiety   ? Arthritis   ? Colon polyps   ? COVID-19 06/2020  ? DVT (deep venous thrombosis) ([place]) 2000  ? Right leg  ? Family history of adverse reaction to anesthesia   ? " MY MOTHER "   ? GERD (gastroesophageal reflux disease)   ? Hyperlipidemia   ? 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 Qualifier: Diagnosis of  By: Council Mechanic MD,  Hilaria Ota   ? Pneumonia 06/2020  ? COVID  with pneumonia  ? Sleep apnea   ? USES CPAP  ?  ?Family History  ?Problem Relation Age of Onset  ? Fibromyalgia Mother   ? Depression Mother   ? Cancer Father   ?     PROSTATECTOMY  ? Stroke Father   ? Cancer Sister   ?     CERVICAL  ? Stroke Paternal Grandfather   ? Hypertension Paternal Grandfather   ? Diabetes Paternal Grandfather   ? Colon polyps Neg Hx   ? Colon cancer Neg Hx   ? Esophageal cancer Neg Hx   ? Stomach cancer Neg Hx   ? Rectal cancer Neg Hx   ?  ?Past Surgical History:  ?Procedure Laterality Date  ? CARPAL TUNNEL RELEASE Right   ? COLONOSCOPY    ? CYSTECTOMY    ? EYE SURGERY    ? KNEE ARTHROSCOPY    ? LEFT LAT MENISCUS TEAR  ? KNEE SURGERY    ? LEFT KNEE CAP WIRING AND PATELLAR REATTACHMENT  ? LEFT HEART CATHETERIZATION WITH CORONARY ANGIOGRAM N/A 10/02/2014  ? Procedure: LEFT HEART CATHETERIZATION WITH CORONARY ANGIOGRAM;  Surgeon: Sanda Klein, MD;  Location[facility] CATH LAB;  Service: Cardiovascular;  Laterality: N/A;  ? ORCHIECTOMY    ? [place]  ? POLYPECTOMY    ? OF [place]  ? ?Social History  ? ?Occupational History  ? Occupation: PLUMBER  ?Tobacco Use  ? Smoking status: Former  ?  Packs/day: 0.50  ?  Years: 1.00  ?  Pack years: 0.50  ?  Types: Cigarettes  ?  Quit date: 10/03/1973  ?  Years since quitting: 48.2  ? Smokeless tobacco: Never  ?Vaping Use  ? Vaping Use: Never used  ?Substance and Sexual Activity  ? Alcohol use: No  ? Drug use: No  ? Sexual activity: Not on file  ? ? ? ? ? ? ?

## 2022-02-07 ENCOUNTER — Other Ambulatory Visit: Payer: Self-pay | Admitting: Family Medicine

## 2022-02-08 NOTE — Telephone Encounter (Signed)
Lasted filled 11/25/21 ?Last ov 01/11/21 ?Next ov none  ?

## 2022-02-17 ENCOUNTER — Other Ambulatory Visit: Payer: Self-pay | Admitting: Family Medicine

## 2022-02-23 ENCOUNTER — Other Ambulatory Visit (INDEPENDENT_AMBULATORY_CARE_PROVIDER_SITE_OTHER): Payer: Self-pay

## 2022-02-23 DIAGNOSIS — Z125 Encounter for screening for malignant neoplasm of prostate: Secondary | ICD-10-CM

## 2022-02-23 DIAGNOSIS — E785 Hyperlipidemia, unspecified: Secondary | ICD-10-CM

## 2022-02-23 DIAGNOSIS — Z131 Encounter for screening for diabetes mellitus: Secondary | ICD-10-CM

## 2022-02-23 DIAGNOSIS — Z79899 Other long term (current) drug therapy: Secondary | ICD-10-CM

## 2022-02-23 LAB — BASIC METABOLIC PANEL
BUN: 14 mg/dL (ref 6–23)
CO2: 29 mEq/L (ref 19–32)
Calcium: 9.3 mg/dL (ref 8.4–10.5)
Chloride: 104 mEq/L (ref 96–112)
Creatinine, Ser: 1.17 mg/dL (ref 0.40–1.50)
GFR: 66.31 mL/min (ref >60.00)
Glucose, Bld: 109 mg/dL — ABNORMAL HIGH (ref 70–99)
Potassium: 4.9 mEq/L (ref 3.5–5.1)
Sodium: 141 mEq/L (ref 135–145)

## 2022-02-23 LAB — CBC WITH DIFFERENTIAL/PLATELET
Basophils Absolute: 0 10*3/uL (ref 0.0–0.1)
Basophils Relative: 0.7 % (ref 0.0–3.0)
Eosinophils Absolute: 0.1 10*3/uL (ref 0.0–0.7)
Eosinophils Relative: 2.5 % (ref 0.0–5.0)
HCT: 37.7 % — ABNORMAL LOW (ref 39.0–52.0)
Hemoglobin: 11.9 g/dL — ABNORMAL LOW (ref 13.0–17.0)
Lymphocytes Relative: 27.4 % (ref 12.0–46.0)
Lymphs Abs: 1.6 10*3/uL (ref 0.7–4.0)
MCHC: 31.7 g/dL (ref 30.0–36.0)
MCV: 82.6 fl (ref 78.0–100.0)
Monocytes Absolute: 0.8 10*3/uL (ref 0.1–1.0)
Monocytes Relative: 13.1 % — ABNORMAL HIGH (ref 3.0–12.0)
Neutro Abs: 3.4 10*3/uL (ref 1.4–7.7)
Neutrophils Relative %: 56.3 % (ref 43.0–77.0)
Platelets: 353 10*3/uL (ref 150.0–400.0)
RBC: 4.57 Mil/uL (ref 4.22–5.81)
RDW: 15.7 % — ABNORMAL HIGH (ref 11.5–15.5)
WBC: 6 10*3/uL (ref 4.0–10.5)

## 2022-02-23 LAB — LIPID PANEL
Cholesterol: 145 mg/dL (ref 0–200)
HDL: 41.5 mg/dL (ref >39.00)
LDL Cholesterol: 83 mg/dL (ref 0–99)
NonHDL: 103.56
Total CHOL/HDL Ratio: 3
Triglycerides: 101 mg/dL (ref 0.0–149.0)
VLDL: 20.2 mg/dL (ref 0.0–40.0)

## 2022-02-23 LAB — HEPATIC FUNCTION PANEL
ALT: 20 U/L (ref 0–53)
AST: 28 U/L (ref 0–37)
Albumin: 4.3 g/dL (ref 3.5–5.2)
Alkaline Phosphatase: 49 U/L (ref 39–117)
Bilirubin, Direct: 0.1 mg/dL (ref 0.0–0.3)
Total Bilirubin: 0.6 mg/dL (ref 0.2–1.2)
Total Protein: 6.5 g/dL (ref 6.0–8.3)

## 2022-02-23 LAB — HEMOGLOBIN A1C: Hgb A1c MFr Bld: 6.6 % — ABNORMAL HIGH (ref 4.6–6.5)

## 2022-02-24 LAB — PSA, TOTAL WITH REFLEX TO PSA, FREE: PSA, Total: 0.6 ng/mL (ref <=4.0)

## 2022-02-28 IMAGING — DX DG CHEST 1V PORT
1 series · 1 of 1 positions shown · non-contrast
Comparison: CTA chest dated September 30, 2014. Chest x-ray dated
September 29, 2014.

CLINICAL DATA: Cough, shortness of breath, and weakness. Recent
YSFNB-US diagnosis a week ago.

EXAM:
PORTABLE CHEST 1 VIEW

[chest ap]
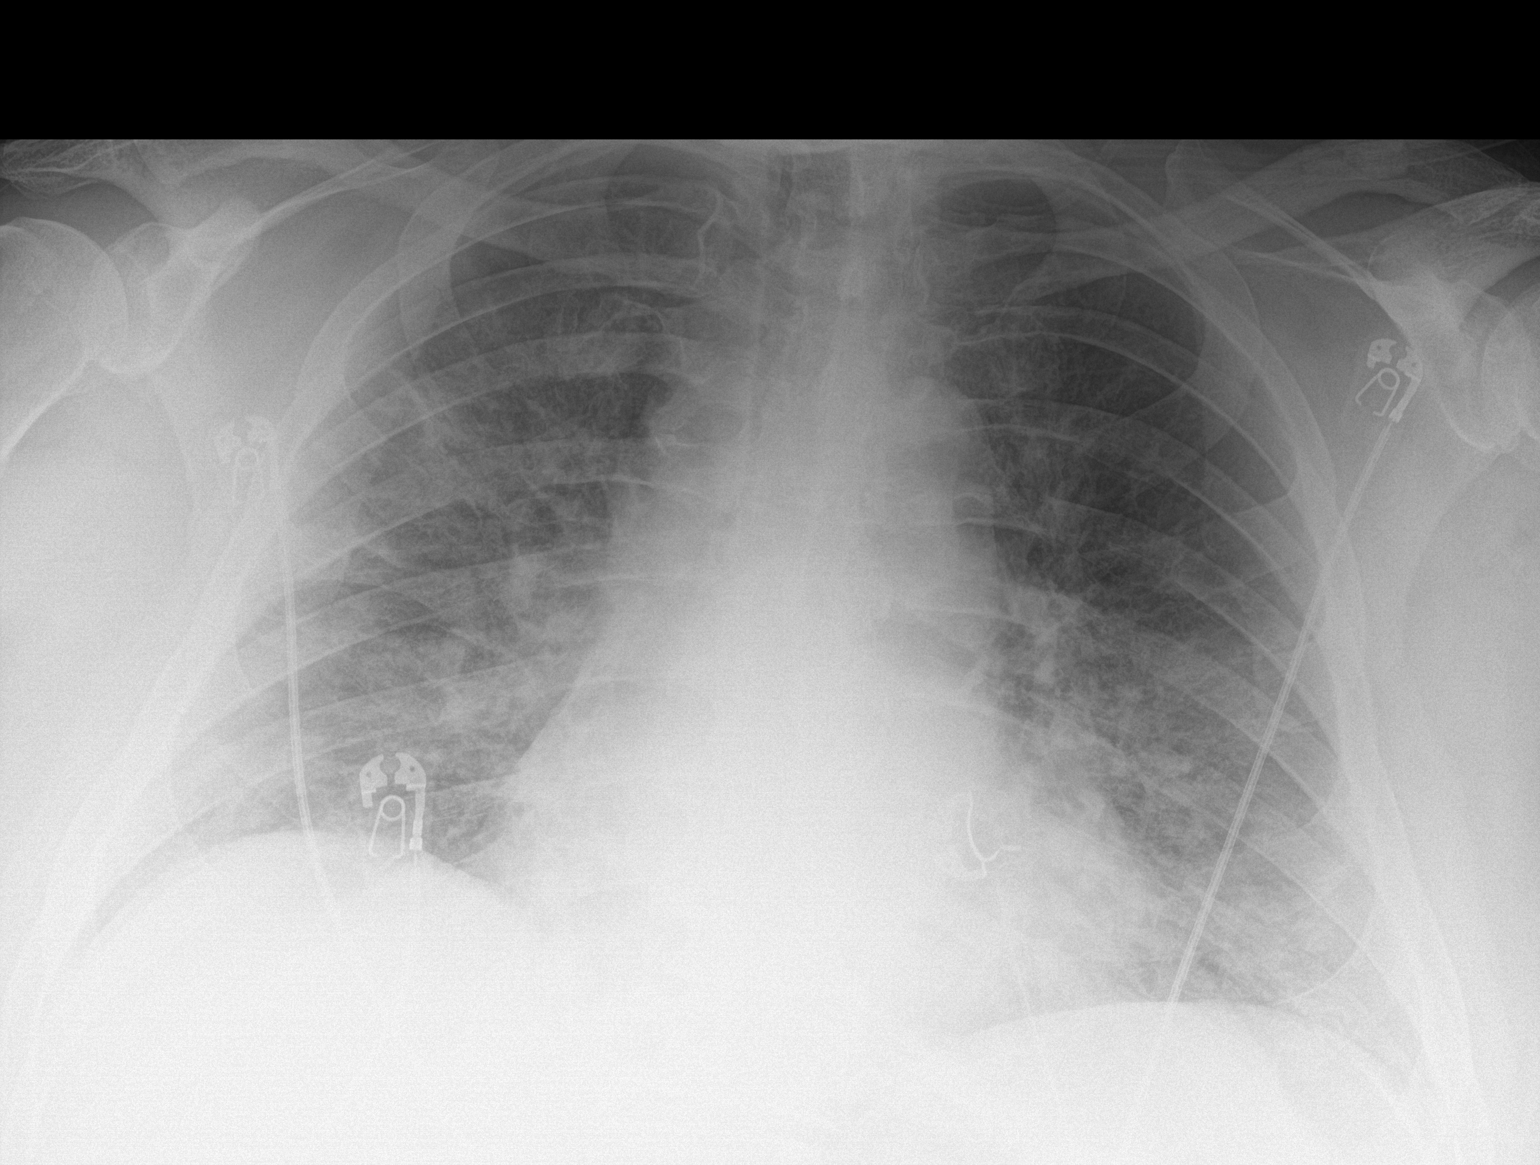

[1 of 1 positions shown; findings below may reference images not displayed]

FINDINGS: The heart size and mediastinal contours are within normal limits.
Normal pulmonary vascularity. Patchy interstitial and airspace
opacities in the right mid lung and both lung bases. No pleural
effusion or pneumothorax. No acute osseous abnormality.
IMPRESSION: 1. Bilateral patchy interstitial and airspace disease consistent
with multifocal YSFNB-US pneumonia.

## 2022-03-02 ENCOUNTER — Encounter: Payer: Self-pay | Admitting: Family Medicine

## 2022-03-02 ENCOUNTER — Ambulatory Visit (INDEPENDENT_AMBULATORY_CARE_PROVIDER_SITE_OTHER): Payer: Self-pay | Admitting: Family Medicine

## 2022-03-02 VITALS — BP 104/60 | HR 78 | Temp 98.3°F | Ht 71.0 in | Wt 289.0 lb

## 2022-03-02 DIAGNOSIS — U099 Post covid-19 condition, unspecified: Secondary | ICD-10-CM | POA: Insufficient documentation

## 2022-03-02 DIAGNOSIS — E119 Type 2 diabetes mellitus without complications: Secondary | ICD-10-CM

## 2022-03-02 DIAGNOSIS — Z0000 Encounter for general adult medical examination without abnormal findings: Secondary | ICD-10-CM

## 2022-03-02 HISTORY — DX: Type 2 diabetes mellitus without complications: E11.9

## 2022-03-02 MED ORDER — BUPROPION HCL ER (XL) 150 MG PO TB24: 150.0000 mg | ORAL_TABLET | Freq: Every day | ORAL | 2 refills | Status: DC

## 2022-03-02 NOTE — Progress Notes (Signed)
Bryan Kirby, Bryan Kirby, [place] at [facility] [place], [address]  Phone: [phone]  FAX: [place] - 66 y.o. male  MRN [id number]  Date of Birth: Sep 05, 1956  Date: 03/02/2022  PCP: Owens Loffler, Bryan Kirby  Referral: Owens Loffler, Bryan Kirby  Chief Complaint  Patient presents with   Annual Exam   Patient Care Team: Owens Loffler, Bryan Kirby as PCP - General (Family Medicine) Subjective:   Bryan Kirby is a 66 y.o. pleasant patient who presents with the following:  Preventative Health Maintenance Visit:  Health Maintenance Summary Reviewed and updated, unless pt declines services.  Tobacco History Reviewed. Alcohol: No concerns, no excessive use Exercise Habits: Some activity, rec at least 30 mins 5 times a week STD concerns: no risk or activity to increase risk Drug Use: None  Covid vacc Shingrix  Ever since Covid, will get out of breath a lot easier.   Feels like he has no energy.  Has to take a lot more breaks.   Long-term fatigue with post-covid. Exercise is down a lot.   New CPAP - pressure?  Some interest, but decreased energy. Some anxiety even on bus with a trip. He does generally feel more depressed in general, he denies any SI or HI. He does have energy, this is decreased, and he notably thinks this is decreased post COVID.  He also has new onset diabetes with a hemoglobin A1c of 6.6 now. He has gained quite a bit of weight over the last few months.  Health Maintenance  Topic Date Due   COVID-19 Vaccine (1) Never done   FOOT EXAM  Never done   OPHTHALMOLOGY EXAM  Never done   Zoster Vaccines- Shingrix (1 of 2) Never done   INFLUENZA VACCINE  05/03/2022   HEMOGLOBIN A1C  08/26/2022   COLONOSCOPY (Pts 45-70yr Insurance coverage will need to be confirmed)  02/20/2023   TETANUS/TDAP  04/09/2023   Hepatitis C Screening  Completed   HIV Screening  Completed   HPV  VACCINES  Aged Out   Immunization History  Administered Date(s) Administered   Influenza Split 08/22/2012   Influenza,inj,Quad PF,6+ Mos 08/27/2013, 07/01/2014, 09/16/2015, 09/04/2017   Td 05/10/1999   Tdap 04/08/2013   Patient Active Problem List   Diagnosis Date Noted   Controlled type 2 diabetes mellitus without complication, without long-term current use of insulin ([facility] 03/02/2022    Priority: High   Morbid obesity ([facility]     Priority: Medium    HLD (hyperlipidemia) 03/15/2007    Priority: Medium    Post-COVID syndrome 03/02/2022   Bilateral carpal tunnel syndrome 12/31/2021   OSA (obstructive sleep apnea) 12/20/2013   Major depression in partial remission ([facility] 03/16/2007   Allergic rhinitis 03/15/2007   HERNIATED DISC  L1/2 R SIDE 03/15/2007    Past Medical History:  Diagnosis Date   Allergic rhinitis due to pollen    Anxiety    Colon polyps    Controlled type 2 diabetes mellitus without complication, without long-term current use of insulin ([facility] 03/02/2022   DVT (deep venous thrombosis) ([facility] 2000   Right leg   GERD (gastroesophageal reflux disease)    Hyperlipidemia    Major depression in partial remission ([facility] 03/16/2007   Qualifier: Diagnosis of  By: SCouncil MechanicMD, RHilaria Ota   Pneumonia 06/2020   COVID  with pneumonia   Sleep apnea    USES CPAP    Past Surgical History:  Procedure Laterality Date   CARPAL TUNNEL RELEASE Right    COLONOSCOPY     CYSTECTOMY     EYE SURGERY     KNEE ARTHROSCOPY     LEFT LAT MENISCUS TEAR   KNEE SURGERY     LEFT KNEE CAP WIRING AND PATELLAR REATTACHMENT   LEFT HEART CATHETERIZATION WITH CORONARY ANGIOGRAM N/A 10/02/2014   Procedure: LEFT HEART CATHETERIZATION WITH CORONARY ANGIOGRAM;  Surgeon: Sanda Klein, Bryan Kirby;  Location: [facility];  Service: Cardiovascular;  Laterality: N/A;   ORCHIECTOMY     MALDEVELOPEMENT AFTER ORCHITIS   POLYPECTOMY     OF PENIS    Family History  Problem Relation Age of Onset    Fibromyalgia Mother    Depression Mother    Cancer Father        PROSTATECTOMY   Stroke Father    Cancer Sister        CERVICAL   Stroke Paternal Grandfather    Hypertension Paternal Grandfather    Diabetes Paternal Grandfather    Colon polyps Neg Hx    Colon cancer Neg Hx    Esophageal cancer Neg Hx    Stomach cancer Neg Hx    Rectal cancer Neg Hx     Social History   Social History Narrative   Not on file    Past Medical History, Surgical History, Social History, Family History, Problem List, Medications, and Allergies have been reviewed and updated if relevant.  Review of Systems: Pertinent positives are listed above.  Otherwise, a full 14 point review of systems has been done in full and it is negative except where it is noted positive.  Objective:   BP 104/60   Pulse 78   Temp 98.3 F (36.8 C) (Oral)   Ht '5\' 11"'$  (1.803 m)   Wt 289 lb (131.1 kg)   SpO2 95%   BMI 40.31 kg/m  Ideal Body Weight: Weight in (lb) to have BMI = 25: 178.9  Ideal Body Weight: Weight in (lb) to have BMI = 25: 178.9 No results found.    03/02/2022    8:24 AM 01/11/2021    8:41 AM 12/19/2018    8:26 AM 12/13/2017    8:32 AM  Depression screen PHQ 2/9  Decreased Interest 0 0 0 0  Down, Depressed, Hopeless 2 0 1 0  PHQ - 2 Score 2 0 1 0  Altered sleeping 1     Tired, decreased energy 3     Change in appetite 3     Feeling bad or failure about yourself  0     Trouble concentrating 0     Moving slowly or fidgety/restless 0     Suicidal thoughts 0     PHQ-9 Score 9     Difficult doing work/chores Somewhat difficult        GEN: well developed, well nourished, no acute distress Eyes: conjunctiva and lids normal, PERRLA, EOMI ENT: TM clear, nares clear, oral exam WNL Neck: supple, no lymphadenopathy, no thyromegaly, no JVD Pulm: clear to auscultation and percussion, respiratory effort normal CV: regular rate and rhythm, S1-S2, no murmur, rub or gallop, no bruits, peripheral pulses  normal and symmetric, no cyanosis, clubbing, edema or varicosities GI: soft, non-tender; no hepatosplenomegaly, masses; active bowel sounds all quadrants GU: deferred Lymph: no cervical, axillary or inguinal adenopathy MSK: gait normal, muscle tone and strength WNL, no joint swelling, effusions, discoloration, crepitus  SKIN: clear, good turgor, color WNL, no rashes, lesions, or ulcerations Neuro: normal  mental status, normal strength, sensation, and motion Psych: alert; oriented to person, place and time, normally interactive and not anxious or depressed in appearance.  All labs reviewed with patient. Results for orders placed or performed in visit on 02/23/22  Lipid panel  Result Value Ref Range   Cholesterol 145 0 - 200 mg/dL   Triglycerides 101.0 0.0 - 149.0 mg/dL   HDL 41.50 >39.00 mg/dL   VLDL 20.2 0.0 - 40.0 mg/dL   LDL Cholesterol 83 0 - 99 mg/dL   Total CHOL/HDL Ratio 3    NonHDL 103.56   Hepatic function panel  Result Value Ref Range   Total Bilirubin 0.6 0.2 - 1.2 mg/dL   Bilirubin, Direct 0.1 0.0 - 0.3 mg/dL   Alkaline Phosphatase 49 39 - 117 U/L   AST 28 0 - 37 U/L   ALT 20 0 - 53 U/L   Total Protein 6.5 6.0 - 8.3 g/dL   Albumin 4.3 3.5 - 5.2 g/dL  Basic metabolic panel  Result Value Ref Range   Sodium 141 135 - 145 mEq/L   Potassium 4.9 3.5 - 5.1 mEq/L   Chloride 104 96 - 112 mEq/L   CO2 29 19 - 32 mEq/L   Glucose, Bld 109 (H) 70 - 99 mg/dL   BUN 14 6 - 23 mg/dL   Creatinine, Ser 1.17 0.40 - 1.50 mg/dL   GFR 66.31 >60.00 mL/min   Calcium 9.3 8.4 - 10.5 mg/dL  CBC with Differential/Platelet  Result Value Ref Range   WBC 6.0 4.0 - 10.5 K/uL   RBC 4.57 4.22 - 5.81 Mil/uL   Hemoglobin 11.9 (L) 13.0 - 17.0 g/dL   HCT 37.7 (L) 39.0 - 52.0 %   MCV 82.6 78.0 - 100.0 fl   MCHC 31.7 30.0 - 36.0 g/dL   RDW 15.7 (H) 11.5 - 15.5 %   Platelets 353.0 150.0 - 400.0 K/uL   Neutrophils Relative % 56.3 43.0 - 77.0 %   Lymphocytes Relative 27.4 12.0 - 46.0 %   Monocytes  Relative 13.1 (H) 3.0 - 12.0 %   Eosinophils Relative 2.5 0.0 - 5.0 %   Basophils Relative 0.7 0.0 - 3.0 %   Neutro Abs 3.4 1.4 - 7.7 K/uL   Lymphs Abs 1.6 0.7 - 4.0 K/uL   Monocytes Absolute 0.8 0.1 - 1.0 K/uL   Eosinophils Absolute 0.1 0.0 - 0.7 K/uL   Basophils Absolute 0.0 0.0 - 0.1 K/uL  Hemoglobin A1c  Result Value Ref Range   Hgb A1c MFr Bld 6.6 (H) 4.6 - 6.5 %  PSA, Total with Reflex to PSA, Free  Result Value Ref Range   PSA, Total 0.6 < OR = 4.0 ng/mL    Assessment and Plan:     ICD-10-CM   1. Healthcare maintenance  Z00.00     2. Controlled type 2 diabetes mellitus without complication, without long-term current use of insulin (HCC)  E11.9     3. Post-COVID syndrome  U09.9      Weight gain, now with morbid obesity.  New onset diabetes with an A1c of 6.6.  Think a lot of this is his weight gain as well as poor diet over the last few months.  Certainly has been prediabetic for some time, and we reviewed the importance of weight loss, discontinuing sugary drinks, overall diet changes.  For now we will get an follow him in 3 months and recheck an A1c.  Hold medication for now.  Post COVID syndrome is challenging, we  reviewed all of this in some detail.  Post COVID, he is also developed some depression that is worsened from his baseline.  He is already on Lexapro 20 mg, and I am going to add an additional Wellbutrin XR 150 mg.  Check on follow-up  Health Maintenance Exam: The patient's preventative maintenance and recommended screening tests for an annual wellness exam were reviewed in full today. Brought up to date unless services declined.  Counselled on the importance of diet, exercise, and its role in overall health and mortality. The patient's FH and SH was reviewed, including their home life, tobacco status, and drug and alcohol status.  Follow-up in 1 year for physical exam or additional follow-up below.  Follow-up: Return in about 3 months (around 06/02/2022)  for diabetes recheck. Or follow-up in 1 year if not noted.  Meds ordered this encounter  Medications   buPROPion (WELLBUTRIN XL) 150 MG 24 hr tablet    Sig: Take 1 tablet (150 mg total) by mouth daily.    Dispense:  30 tablet    Refill:  2   Medications Discontinued During This Encounter  Medication Reason   meloxicam (MOBIC) 15 MG tablet Completed Course   No orders of the defined types were placed in this encounter.   Signed,  Maud Deed. Yasemin Rabon, Bryan Kirby   Allergies as of 03/02/2022       Reactions   Codeine    REACTION: itching   Penicillins Itching   REACTION: UNSPECIFIED        Medication List        Accurate as of Mar 02, 2022  9:30 AM. If you have any questions, ask your nurse or doctor.          STOP taking these medications    meloxicam 15 MG tablet Commonly known as: MOBIC Stopped by: Owens Loffler, Bryan Kirby       TAKE these medications    aspirin 81 MG chewable tablet Chew 81 mg by mouth daily.   buPROPion 150 MG 24 hr tablet Commonly known as: WELLBUTRIN XL Take 1 tablet (150 mg total) by mouth daily. Started by: Owens Loffler, Bryan Kirby   diclofenac sodium 1 % Gel Commonly known as: VOLTAREN Apply 4 g topically 4 (four) times daily as needed. Apply to large joint PRN   escitalopram 20 MG tablet Commonly known as: LEXAPRO TAKE ONE TABLET BY MOUTH DAILY   fexofenadine 180 MG tablet Commonly known as: ALLEGRA Take 1 tablet (180 mg total) by mouth daily.   pantoprazole 40 MG tablet Commonly known as: PROTONIX TAKE ONE TABLET BY MOUTH DAILY   simvastatin 40 MG tablet Commonly known as: ZOCOR TAKE ONE TABLET BY MOUTH AT BEDTIME   TYLENOL 8 HOUR PO Take 1,000 mg by mouth once as needed (pain).   vitamin C 1000 MG tablet   Vitamin D3 25 MCG tablet Commonly known as: Vitamin D Take 1 tablet (1,000 Units total) by mouth daily.   zinc sulfate 220 (50 Zn) MG capsule Take 1 capsule (220 mg total) by mouth daily.

## 2022-03-23 ENCOUNTER — Telehealth: Payer: Self-pay

## 2022-03-23 NOTE — Telephone Encounter (Signed)
Received a call from [place], patients wife. Otila Kluver said she received a bill from [employer] and when calling to inquire about a discount they told her to contact us so we can push that information through. Amy please advise.

## 2022-04-23 ENCOUNTER — Other Ambulatory Visit: Payer: Self-pay | Admitting: Family Medicine

## 2022-05-06 LAB — HEMOGLOBIN A1C: Hemoglobin A1C: 5.9

## 2022-05-25 ENCOUNTER — Other Ambulatory Visit: Payer: Self-pay | Admitting: Family Medicine

## 2022-05-30 ENCOUNTER — Other Ambulatory Visit: Payer: Self-pay | Admitting: Family Medicine

## 2022-06-07 NOTE — Progress Notes (Unsigned)
    Bryan Maready T. Elany Felix, MD, [place] at [facility] [place], [address]  Phone: [phone]  FAX: [phone]  Bryan Kirby - 66 y.o. male  MRN [id number]  Date of Birth: Jun 24, 1956  Date: 06/08/2022  PCP: Owens Loffler, MD  Referral: Owens Loffler, MD  No chief complaint on file.  Subjective:   Bryan Kirby is a 66 y.o. very pleasant male patient with There is no height or weight on file to calculate BMI. who presents with the following:  He is here to f/u about his recent new diagnosis of diabetes with an a1c of 6.6.  He is not on any medication now, and our hope was that his blood sugar would improve with diet and lifestyle changes.   Diabetes Mellitus: Tolerating Medications: none now Compliance with diet: fair - but doing better, There is no height or weight on file to calculate BMI. Exercise: minimal / intermittent Avg blood sugars at home: not checking Foot problems: none Hypoglycemia: none No nausea, vomitting, blurred vision, polyuria.  Lab Results  Component Value Date   HGBA1C 6.6 (H) 02/23/2022   HGBA1C 6.5 01/04/2021   HGBA1C 6.5 (H) 06/22/2020   Lab Results  Component Value Date   MICROALBUR 0.2 05/06/2009   LDLCALC 83 02/23/2022   CREATININE 1.17 02/23/2022    Wt Readings from Last 3 Encounters:  03/02/22 289 lb (131.1 kg)  09/01/21 288 lb (130.6 kg)  02/19/21 275 lb (124.7 kg)     Review of Systems is noted in the HPI, as appropriate  Objective:   There were no vitals taken for this visit.  GEN: No acute distress; alert,appropriate. PULM: Breathing comfortably in no respiratory distress PSYCH: Normally interactive.   Laboratory and Imaging Data:  Assessment and Plan:   ***

## 2022-06-08 ENCOUNTER — Ambulatory Visit (INDEPENDENT_AMBULATORY_CARE_PROVIDER_SITE_OTHER): Payer: Self-pay | Admitting: Family Medicine

## 2022-06-08 ENCOUNTER — Encounter: Payer: Self-pay | Admitting: Family Medicine

## 2022-06-08 VITALS — BP 100/64 | HR 72 | Temp 98.3°F | Ht 71.0 in | Wt 279.0 lb

## 2022-06-08 DIAGNOSIS — E119 Type 2 diabetes mellitus without complications: Secondary | ICD-10-CM

## 2022-08-21 ENCOUNTER — Other Ambulatory Visit: Payer: Self-pay | Admitting: Family Medicine

## 2022-10-17 ENCOUNTER — Other Ambulatory Visit: Payer: Self-pay | Admitting: Family Medicine

## 2023-01-19 ENCOUNTER — Telehealth: Payer: Self-pay | Admitting: Family Medicine

## 2023-01-19 MED ORDER — BUPROPION HCL ER (XL) 150 MG PO TB24: 150.0000 mg | ORAL_TABLET | Freq: Every day | ORAL | 0 refills | Status: DC

## 2023-01-19 NOTE — Telephone Encounter (Signed)
Refill sent as requested. 

## 2023-01-19 NOTE — Telephone Encounter (Signed)
Pt called asking if Dr. Patsy Lager could send In a 90 day supply of the meds, buPROPion (WELLBUTRIN XL) 150 MG 24 hr tablet? Pt states he always receives 30 day supplies & not 90. Pt states he spoke to pharmacy about changing it & was instructed to contact our office. Preferred pharmacy [facility] [id number] - [place], [place] - [address] Call back # [phone]

## 2023-01-23 ENCOUNTER — Ambulatory Visit (INDEPENDENT_AMBULATORY_CARE_PROVIDER_SITE_OTHER): Payer: Self-pay | Admitting: Family Medicine

## 2023-01-23 ENCOUNTER — Encounter: Payer: Self-pay | Admitting: Family Medicine

## 2023-01-23 VITALS — BP 100/62 | HR 77 | Temp 97.3°F | Ht 71.0 in | Wt 280.4 lb

## 2023-01-23 DIAGNOSIS — L237 Allergic contact dermatitis due to plants, except food: Secondary | ICD-10-CM

## 2023-01-23 MED ORDER — BETAMETHASONE VALERATE 0.1 % EX OINT: 1.0000 | TOPICAL_OINTMENT | Freq: Two times a day (BID) | CUTANEOUS | 0 refills | Status: DC

## 2023-01-23 NOTE — Progress Notes (Signed)
    Bryan Hofman T. Bryan Winer, MD, CAQ Sports Medicine [facility] at [facility] [address] [place], [address]  Phone: [phone]  FAX: [phone]  Bryan Kirby - 67 y.o. male  MRN [id number]  Date of Birth: [birth year]  Date: 01/23/2023  PCP: [name], MD  Referral: [name], MD  Chief Complaint  Patient presents with   Rash    Right Forearm   Subjective:   Bryan Kirby is a 67 y.o. very pleasant male patient with Body mass index is 39.1 kg/m. who presents with the following:  Poison ivy -right forearm.  She has been doing a lot of planting and yard work over the last week or so.  Rashes been present on the volar forearm in 2 to 3 days.  It is highly pruritic.  Review of Systems is noted in the HPI, as appropriate  Objective:   BP 100/62   Pulse 77   Temp (!) 97.3 F (36.3 C) (Temporal)   Ht  (1.803 m)   Wt 280 lb 6 oz (127.2 kg)   SpO2 95%   BMI 39.10 kg/m   GEN: No acute distress; alert,appropriate. PULM: Breathing comfortably in no respiratory distress PSYCH: Normally interactive.     Laboratory and Imaging Data:  Assessment and Plan:     ICD-10-CM   1. Poison ivy  L23.7      Classic in appearance.  Oral steroids if this does not improve.  Medication Management during today's office visit: Meds ordered this encounter  Medications   betamethasone valerate ointment (VALISONE) 0.1 %    Sig: Apply 1 Application topically 2 (two) times daily.    Dispense:  45 g    Refill:  0   There are no discontinued medications.  Orders placed today for conditions managed today: No orders of the defined types were placed in this encounter.   Disposition: No follow-ups on file.  Dragon [employer] One speech-to-text software was used for transcription in this dictation.  Possible transcriptional errors can occur using Animal nutritionist.   Signed,  Elpidio Galea. Garo Heidelberg, MD   Outpatient Encounter Medications as  of 01/23/2023  Medication Sig   Acetaminophen (TYLENOL 8 HOUR PO) Take 1,000 mg by mouth once as needed (pain).   Ascorbic Acid (VITAMIN C) 1000 MG tablet    aspirin 81 MG chewable tablet Chew 81 mg by mouth daily.   betamethasone valerate ointment (VALISONE) 0.1 % Apply 1 Application topically 2 (two) times daily.   buPROPion (WELLBUTRIN XL) 150 MG 24 hr tablet Take 1 tablet (150 mg total) by mouth daily.   cholecalciferol (VITAMIN D) 25 MCG tablet Take 1 tablet (1,000 Units total) by mouth daily.   diclofenac sodium (VOLTAREN) 1 % GEL Apply 4 g topically 4 (four) times daily as needed. Apply to large joint PRN   escitalopram (LEXAPRO) 20 MG tablet TAKE 1 TABLET BY MOUTH DAILY   fexofenadine (ALLEGRA) 180 MG tablet Take 1 tablet (180 mg total) by mouth daily.   pantoprazole (PROTONIX) 40 MG tablet TAKE ONE TABLET BY MOUTH DAILY   simvastatin (ZOCOR) 40 MG tablet TAKE 1 TABLET BY MOUTH AT BEDTIME   zinc sulfate 220 (50 Zn) MG capsule Take 1 capsule (220 mg total) by mouth daily.   No facility-administered encounter medications on file as of 01/23/2023.

## 2023-02-02 ENCOUNTER — Encounter: Payer: Self-pay | Admitting: Gastroenterology

## 2023-02-03 ENCOUNTER — Other Ambulatory Visit: Payer: Self-pay | Admitting: Family Medicine

## 2023-03-01 ENCOUNTER — Other Ambulatory Visit: Payer: Self-pay | Admitting: Family Medicine

## 2023-03-01 DIAGNOSIS — E782 Mixed hyperlipidemia: Secondary | ICD-10-CM

## 2023-03-01 DIAGNOSIS — Z79899 Other long term (current) drug therapy: Secondary | ICD-10-CM

## 2023-03-01 DIAGNOSIS — Z125 Encounter for screening for malignant neoplasm of prostate: Secondary | ICD-10-CM

## 2023-03-01 DIAGNOSIS — E119 Type 2 diabetes mellitus without complications: Secondary | ICD-10-CM

## 2023-03-02 ENCOUNTER — Other Ambulatory Visit (INDEPENDENT_AMBULATORY_CARE_PROVIDER_SITE_OTHER): Payer: Self-pay

## 2023-03-02 DIAGNOSIS — E119 Type 2 diabetes mellitus without complications: Secondary | ICD-10-CM

## 2023-03-02 DIAGNOSIS — Z79899 Other long term (current) drug therapy: Secondary | ICD-10-CM

## 2023-03-02 DIAGNOSIS — Z125 Encounter for screening for malignant neoplasm of prostate: Secondary | ICD-10-CM

## 2023-03-02 DIAGNOSIS — E782 Mixed hyperlipidemia: Secondary | ICD-10-CM

## 2023-03-02 LAB — BASIC METABOLIC PANEL
BUN: 21 mg/dL (ref 6–23)
CO2: 28 mEq/L (ref 19–32)
Calcium: 8.9 mg/dL (ref 8.4–10.5)
Chloride: 106 mEq/L (ref 96–112)
Creatinine, Ser: 1.24 mg/dL (ref 0.40–1.50)
GFR: 61.4 mL/min (ref >60.00)
Glucose, Bld: 105 mg/dL — ABNORMAL HIGH (ref 70–99)
Potassium: 5 mEq/L (ref 3.5–5.1)
Sodium: 140 mEq/L (ref 135–145)

## 2023-03-02 LAB — MICROALBUMIN / CREATININE URINE RATIO
Creatinine,U: 129.9 mg/dL
Microalb Creat Ratio: 0.5 mg/g (ref 0.0–30.0)
Microalb, Ur: <0.7 mg/dL (ref 0.0–1.9)

## 2023-03-02 LAB — CBC WITH DIFFERENTIAL/PLATELET
Basophils Absolute: 0 10*3/uL (ref 0.0–0.1)
Basophils Relative: 0.5 % (ref 0.0–3.0)
Eosinophils Absolute: 0.1 10*3/uL (ref 0.0–0.7)
Eosinophils Relative: 2.2 % (ref 0.0–5.0)
HCT: 35.7 % — ABNORMAL LOW (ref 39.0–52.0)
Hemoglobin: 11.3 g/dL — ABNORMAL LOW (ref 13.0–17.0)
Lymphocytes Relative: 23 % (ref 12.0–46.0)
Lymphs Abs: 1.4 10*3/uL (ref 0.7–4.0)
MCHC: 31.7 g/dL (ref 30.0–36.0)
MCV: 80.9 fl (ref 78.0–100.0)
Monocytes Absolute: 0.7 10*3/uL (ref 0.1–1.0)
Monocytes Relative: 10.8 % (ref 3.0–12.0)
Neutro Abs: 3.9 10*3/uL (ref 1.4–7.7)
Neutrophils Relative %: 63.5 % (ref 43.0–77.0)
Platelets: 342 10*3/uL (ref 150.0–400.0)
RBC: 4.42 Mil/uL (ref 4.22–5.81)
RDW: 16.5 % — ABNORMAL HIGH (ref 11.5–15.5)
WBC: 6.2 10*3/uL (ref 4.0–10.5)

## 2023-03-02 LAB — HEPATIC FUNCTION PANEL
ALT: 14 U/L (ref 0–53)
AST: 25 U/L (ref 0–37)
Albumin: 4 g/dL (ref 3.5–5.2)
Alkaline Phosphatase: 42 U/L (ref 39–117)
Bilirubin, Direct: 0.1 mg/dL (ref 0.0–0.3)
Total Bilirubin: 0.3 mg/dL (ref 0.2–1.2)
Total Protein: 6.2 g/dL (ref 6.0–8.3)

## 2023-03-02 LAB — LIPID PANEL
Cholesterol: 115 mg/dL (ref 0–200)
HDL: 41.2 mg/dL (ref >39.00)
LDL Cholesterol: 56 mg/dL (ref 0–99)
NonHDL: 73.41
Total CHOL/HDL Ratio: 3
Triglycerides: 86 mg/dL (ref 0.0–149.0)
VLDL: 17.2 mg/dL (ref 0.0–40.0)

## 2023-03-02 LAB — HEMOGLOBIN A1C: Hgb A1c MFr Bld: 6.3 % (ref 4.6–6.5)

## 2023-03-06 LAB — PSA, TOTAL WITH REFLEX TO PSA, FREE: PSA, Total: 0.8 ng/mL (ref <=4.0)

## 2023-03-12 NOTE — Progress Notes (Unsigned)
[name], MD, CAQ Sports Medicine [facility] at [facility] [address] [place], [address]  Phone: [phone]  FAX: [phone]  RHAMEL Kirby - 67 y.o. male  MRN [id number]  Date of Birth: 10/31/1955  Date: 03/13/2023  PCP: [name], MD  Referral: [name], MD  No chief complaint on file.  Patient Care Team: [name], MD as PCP - General (Family Medicine) Subjective:   Bryan Kirby is a 67 y.o. pleasant patient who presents with the following:  Preventative Health Maintenance Visit:  Health Maintenance Summary Reviewed and updated, unless pt declines services.  Tobacco History Reviewed. Alcohol: No concerns, no excessive use Exercise Habits: Some activity, rec at least 30 mins 5 times a week STD concerns: no risk or activity to increase risk Drug Use: None  Covid vac Eye exam Foot exam Shingrix  Health Maintenance  Topic Date Due   COVID-19 Vaccine (1) Never done   FOOT EXAM  Never done   OPHTHALMOLOGY EXAM  Never done   Zoster Vaccines- Shingrix (1 of 2) Never done   Colonoscopy  02/20/2023   DTaP/Tdap/Td (3 - Td or Tdap) 04/09/2023   INFLUENZA VACCINE  05/04/2023   HEMOGLOBIN A1C  09/02/2023   Diabetic kidney evaluation - eGFR measurement  03/01/2024   Diabetic kidney evaluation - Urine ACR  03/01/2024   Hepatitis C Screening  Completed   HIV Screening  Completed   HPV VACCINES  Aged Out   Immunization History  Administered Date(s) Administered   Influenza Split 08/22/2012   Influenza,inj,Quad PF,6+ Mos 08/27/2013, 07/01/2014, 09/16/2015, 09/04/2017   Td 05/10/1999   Tdap 04/08/2013   Patient Active Problem List   Diagnosis Date Noted   Controlled type 2 diabetes mellitus without complication, without long-term current use of insulin (HCC) 03/02/2022    Priority: High   Morbid obesity (HCC)     Priority: Medium    HLD (hyperlipidemia) 03/15/2007    Priority: Medium     Bilateral carpal tunnel syndrome 12/31/2021   OSA (obstructive sleep apnea) 12/20/2013   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/16/2007   Allergic rhinitis 03/15/2007   HERNIATED DISC  L1/2 R SIDE 03/15/2007    Past Medical History:  Diagnosis Date   Allergic rhinitis due to pollen    Anxiety    Colon polyps    Controlled type 2 diabetes mellitus without complication, without long-term current use of insulin (HCC) 03/02/2022   DVT (deep venous thrombosis) (HCC) 2000   Right leg   GERD (gastroesophageal reflux disease)    Hyperlipidemia    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/16/2007   Qualifier: Diagnosis of  By: Hetty Ely MD, Franne Grip    Pneumonia 06/2020   COVID  with pneumonia   Sleep apnea    USES CPAP    Past Surgical History:  Procedure Laterality Date   CARPAL TUNNEL RELEASE Right    COLONOSCOPY     CYSTECTOMY     EYE SURGERY     KNEE ARTHROSCOPY     LEFT LAT MENISCUS TEAR   KNEE SURGERY     LEFT KNEE CAP WIRING AND PATELLAR REATTACHMENT   LEFT HEART CATHETERIZATION WITH CORONARY ANGIOGRAM N/A 10/02/2014   Procedure: LEFT HEART CATHETERIZATION WITH CORONARY ANGIOGRAM;  Surgeon: Thurmon Fair, MD;  Location: [facility] CATH [facility];  Service: Cardiovascular;  Laterality: N/A;   ORCHIECTOMY     MALDEVELOPEMENT AFTER ORCHITIS   POLYPECTOMY     OF PENIS  Family History  Problem Relation Age of Onset   Fibromyalgia Mother    Depression Mother    Cancer Father        PROSTATECTOMY   Stroke Father    Cancer Sister        CERVICAL   Stroke Paternal Grandfather    Hypertension Paternal Grandfather    Diabetes Paternal Grandfather    Colon polyps Neg Hx    Colon cancer Neg Hx    Esophageal cancer Neg Hx    Stomach cancer Neg Hx    Rectal cancer Neg Hx     Social History   Social History Narrative   Not on file    Past Medical History, Surgical History, Social History, Family History, Problem List, Medications, and Allergies have been reviewed  and updated if relevant.  Review of Systems: Pertinent positives are listed above.  Otherwise, a full 14 point review of systems has been done in full and it is negative except where it is noted positive.  Objective:   There were no vitals taken for this visit. Ideal Body Weight:    Ideal Body Weight:   No results found.    03/02/2022    8:24 AM 01/11/2021    8:41 AM 12/19/2018    8:26 AM 12/13/2017    8:32 AM  Depression screen PHQ 2/9  Decreased Interest 0 0 0 0  Down, Depressed, Hopeless 2 0 1 0  PHQ - 2 Score 2 0 1 0  Altered sleeping 1     Tired, decreased energy 3     Change in appetite 3     Feeling bad or failure about yourself  0     Trouble concentrating 0     Moving slowly or fidgety/restless 0     Suicidal thoughts 0     PHQ-9 Score 9     Difficult doing work/chores Somewhat difficult        GEN: well developed, well nourished, no acute distress Eyes: conjunctiva and lids normal, PERRLA, EOMI ENT: TM clear, nares clear, oral exam WNL Neck: supple, no lymphadenopathy, no thyromegaly, no JVD Pulm: clear to auscultation and percussion, respiratory effort normal CV: regular rate and rhythm, S1-S2, no murmur, rub or gallop, no bruits, peripheral pulses normal and symmetric, no cyanosis, clubbing, edema or varicosities GI: soft, non-tender; no hepatosplenomegaly, masses; active bowel sounds all quadrants GU: deferred Lymph: no cervical, axillary or inguinal adenopathy MSK: gait normal, muscle tone and strength WNL, no joint swelling, effusions, discoloration, crepitus  SKIN: clear, good turgor, color WNL, no rashes, lesions, or ulcerations Neuro: normal mental status, normal strength, sensation, and motion Psych: alert; oriented to person, place and time, normally interactive and not anxious or depressed in appearance.  All labs reviewed with patient. Results for orders placed or performed in visit on 03/02/23  PSA, Total with Reflex to PSA, Free  Result Value Ref  Range   PSA, Total 0.8 < OR = 4.0 ng/mL  Microalbumin / creatinine urine ratio  Result Value Ref Range   Microalb, Ur <0.7 0.0 - 1.9 mg/dL   Creatinine,U 098.1 mg/dL   Microalb Creat Ratio 0.5 0.0 - 30.0 mg/g  Lipid panel  Result Value Ref Range   Cholesterol 115 0 - 200 mg/dL   Triglycerides 19.1 0.0 - 149.0 mg/dL   HDL 47.82 >95.62 mg/dL   VLDL 13.0 0.0 - 86.5 mg/dL   LDL Cholesterol 56 0 - 99 mg/dL   Total CHOL/HDL Ratio 3  NonHDL 73.41   Hemoglobin A1c  Result Value Ref Range   Hgb A1c MFr Bld 6.3 4.6 - 6.5 %  Hepatic function panel  Result Value Ref Range   Total Bilirubin 0.3 0.2 - 1.2 mg/dL   Bilirubin, Direct 0.1 0.0 - 0.3 mg/dL   Alkaline Phosphatase 42 39 - 117 U/L   AST 25 0 - 37 U/L   ALT 14 0 - 53 U/L   Total Protein 6.2 6.0 - 8.3 g/dL   Albumin 4.0 3.5 - 5.2 g/dL  CBC with Differential/Platelet  Result Value Ref Range   WBC 6.2 4.0 - 10.5 K/uL   RBC 4.42 4.22 - 5.81 Mil/uL   Hemoglobin 11.3 (L) 13.0 - 17.0 g/dL   HCT 16.1 (L) 09.6 - 04.5 %   MCV 80.9 78.0 - 100.0 fl   MCHC 31.7 30.0 - 36.0 g/dL   RDW 40.9 (H) 81.1 - 91.4 %   Platelets 342.0 150.0 - 400.0 K/uL   Neutrophils Relative % 63.5 43.0 - 77.0 %   Lymphocytes Relative 23.0 12.0 - 46.0 %   Monocytes Relative 10.8 3.0 - 12.0 %   Eosinophils Relative 2.2 0.0 - 5.0 %   Basophils Relative 0.5 0.0 - 3.0 %   Neutro Abs 3.9 1.4 - 7.7 K/uL   Lymphs Abs 1.4 0.7 - 4.0 K/uL   Monocytes Absolute 0.7 0.1 - 1.0 K/uL   Eosinophils Absolute 0.1 0.0 - 0.7 K/uL   Basophils Absolute 0.0 0.0 - 0.1 K/uL  Basic metabolic panel  Result Value Ref Range   Sodium 140 135 - 145 mEq/L   Potassium 5.0 3.5 - 5.1 mEq/L   Chloride 106 96 - 112 mEq/L   CO2 28 19 - 32 mEq/L   Glucose, Bld 105 (H) 70 - 99 mg/dL   BUN 21 6 - 23 mg/dL   Creatinine, Ser 7.82 0.40 - 1.50 mg/dL   GFR 95.62 >13.08 mL/min   Calcium 8.9 8.4 - 10.5 mg/dL    Assessment and Plan:     ICD-10-CM   1. Healthcare maintenance  Z00.00        Health Maintenance Exam: The patient's preventative maintenance and recommended screening tests for an annual wellness exam were reviewed in full today. Brought up to date unless services declined.  Counselled on the importance of diet, exercise, and its role in overall health and mortality. The patient's FH and SH was reviewed, including their home life, tobacco status, and drug and alcohol status.  Follow-up in 1 year for physical exam or additional follow-up below.  Disposition: No follow-ups on file.  No orders of the defined types were placed in this encounter.  There are no discontinued medications. No orders of the defined types were placed in this encounter.   Signed,  Elpidio Galea. Evelio Rueda, MD   Allergies as of 03/13/2023       Reactions   Codeine    REACTION: itching   Penicillins Itching   REACTION: UNSPECIFIED        Medication List        Accurate as of March 12, 2023  8:24 AM. If you have any questions, ask your nurse or doctor.          aspirin 81 MG chewable tablet Chew 81 mg by mouth daily.   betamethasone valerate ointment 0.1 % Commonly known as: VALISONE Apply 1 Application topically 2 (two) times daily.   buPROPion 150 MG 24 hr tablet Commonly known as: WELLBUTRIN XL Take 1  tablet (150 mg total) by mouth daily.   diclofenac sodium 1 % Gel Commonly known as: VOLTAREN Apply 4 g topically 4 (four) times daily as needed. Apply to large joint PRN   escitalopram 20 MG tablet Commonly known as: LEXAPRO TAKE 1 TABLET BY MOUTH DAILY   fexofenadine 180 MG tablet Commonly known as: ALLEGRA Take 1 tablet (180 mg total) by mouth daily.   pantoprazole 40 MG tablet Commonly known as: PROTONIX TAKE ONE TABLET BY MOUTH DAILY   simvastatin 40 MG tablet Commonly known as: ZOCOR TAKE 1 TABLET BY MOUTH AT BEDTIME   TYLENOL 8 HOUR PO Take 1,000 mg by mouth once as needed (pain).   vitamin C 1000 MG tablet   vitamin D3 25 MCG  tablet Commonly known as: CHOLECALCIFEROL Take 1 tablet (1,000 Units total) by mouth daily.   zinc sulfate 220 (50 Zn) MG capsule Take 1 capsule (220 mg total) by mouth daily.

## 2023-03-13 ENCOUNTER — Ambulatory Visit: Payer: Self-pay | Admitting: Family Medicine

## 2023-03-13 ENCOUNTER — Encounter: Payer: Self-pay | Admitting: Family Medicine

## 2023-03-13 ENCOUNTER — Ambulatory Visit (INDEPENDENT_AMBULATORY_CARE_PROVIDER_SITE_OTHER): Payer: Self-pay | Admitting: Family Medicine

## 2023-03-13 VITALS — BP 128/78 | HR 72 | Temp 97.5°F | Ht 71.0 in | Wt 270.1 lb

## 2023-03-13 DIAGNOSIS — Z0000 Encounter for general adult medical examination without abnormal findings: Secondary | ICD-10-CM

## 2023-03-20 ENCOUNTER — Encounter: Payer: Self-pay | Admitting: Gastroenterology

## 2023-04-11 ENCOUNTER — Other Ambulatory Visit: Payer: Self-pay | Admitting: Family Medicine

## 2023-05-07 ENCOUNTER — Other Ambulatory Visit: Payer: Self-pay | Admitting: Family Medicine

## 2023-05-16 ENCOUNTER — Other Ambulatory Visit: Payer: Self-pay | Admitting: Family Medicine

## 2023-05-25 ENCOUNTER — Telehealth: Payer: Self-pay | Admitting: Family Medicine

## 2023-05-25 MED ORDER — BUPROPION HCL ER (XL) 150 MG PO TB24: 150.0000 mg | ORAL_TABLET | Freq: Every day | ORAL | 1 refills | Status: DC

## 2023-05-25 NOTE — Telephone Encounter (Signed)
Pt called in would like PCP to send something in to pharmacy for jock itch that was discuss at last visit . Also would like RXbuPROPion (WELLBUTRIN XL) 150 MG 24 hr tablet  be put in for a 90 day supply on his next refill . Please advise # [phone]

## 2023-05-25 NOTE — Telephone Encounter (Signed)
He should get some over the counter Clotrimazole and apply twice a day.  If he is already doing that, then I could send in a prescription, but the clotrimazole usually works very well and is tolerated well in that sensitive area.

## 2023-05-25 NOTE — Telephone Encounter (Signed)
Bryan Kirby notified as instructed by telephone.  He states he has tried a prescription strength OTC cream that starts with a T but is currently driving and can't look up the name of the medication.  He has also used a powder.  He states it will go away but then comes right back.  Please advise.

## 2023-05-26 MED ORDER — FLUCONAZOLE 150 MG PO TABS: ORAL_TABLET | ORAL | 0 refills | Status: DC

## 2023-05-26 NOTE — Telephone Encounter (Signed)
Please return call  I sent him in some Diflucan to take once a week for 4 weeks  He should get Clotrimazole over the counter and apply twice a day.  He needs to continue it even after the rash clears for an additional 2 weeks.  A lot of the time when only partially treated, it comes back.

## 2023-05-26 NOTE — Telephone Encounter (Signed)
Mr. Glassco notified as instructed by telephone.  Patient states understanding.

## 2023-07-13 DIAGNOSIS — L720 Epidermal cyst: Secondary | ICD-10-CM | POA: Diagnosis not present

## 2023-07-13 DIAGNOSIS — L918 Other hypertrophic disorders of the skin: Secondary | ICD-10-CM | POA: Diagnosis not present

## 2023-07-13 DIAGNOSIS — D1801 Hemangioma of skin and subcutaneous tissue: Secondary | ICD-10-CM | POA: Diagnosis not present

## 2023-07-13 DIAGNOSIS — L570 Actinic keratosis: Secondary | ICD-10-CM | POA: Diagnosis not present

## 2023-07-13 DIAGNOSIS — L820 Inflamed seborrheic keratosis: Secondary | ICD-10-CM | POA: Diagnosis not present

## 2023-07-13 DIAGNOSIS — L821 Other seborrheic keratosis: Secondary | ICD-10-CM | POA: Diagnosis not present

## 2023-07-24 ENCOUNTER — Ambulatory Visit (AMBULATORY_SURGERY_CENTER): Payer: Self-pay

## 2023-07-24 VITALS — Ht 71.0 in | Wt 270.0 lb

## 2023-07-24 DIAGNOSIS — Z860100 Personal history of colon polyps, unspecified: Secondary | ICD-10-CM

## 2023-07-24 MED ORDER — NA SULFATE-K SULFATE-MG SULF 17.5-3.13-1.6 GM/177ML PO SOLN
1.0000 | Freq: Once | ORAL | 0 refills | Status: AC
Start: 2023-07-24 — End: 2023-07-24

## 2023-07-24 NOTE — Addendum Note (Signed)
Addended by: Jaquelyn Bitter on: 07/24/2023 08:39 AM   Modules accepted: Orders

## 2023-07-24 NOTE — Progress Notes (Signed)
No egg or soy allergy known to patient  No issues known to pt with past sedation with any surgeries or procedures Patient denies ever being told they had issues or difficulty with intubation  No FH of Malignant Hyperthermia Pt is not on diet pills Pt is not on  home 02  Pt is not on blood thinners  Pt denies issues with constipation  No A fib or A flutter Have any cardiac testing pending--no Pt can ambulate independently Pt denies use of chewing tobacco Discussed diabetic I weight loss medication holds Discussed NSAID holds Checked BMI Pt instructed to use [employer] or GoodRx for a price reduction on prep  Patient's chart reviewed by Bryan Kirby CNRA prior to previsit and patient appropriate for the [facility].  Pre visit completed and red dot placed by patient's name on their procedure day (on provider's schedule).

## 2023-08-08 ENCOUNTER — Encounter: Payer: Self-pay | Admitting: Gastroenterology

## 2023-08-10 DIAGNOSIS — H2513 Age-related nuclear cataract, bilateral: Secondary | ICD-10-CM | POA: Diagnosis not present

## 2023-08-10 DIAGNOSIS — H10413 Chronic giant papillary conjunctivitis, bilateral: Secondary | ICD-10-CM | POA: Diagnosis not present

## 2023-08-10 DIAGNOSIS — G4733 Obstructive sleep apnea (adult) (pediatric): Secondary | ICD-10-CM | POA: Diagnosis not present

## 2023-08-10 DIAGNOSIS — H5010 Unspecified exotropia: Secondary | ICD-10-CM | POA: Diagnosis not present

## 2023-08-10 DIAGNOSIS — H532 Diplopia: Secondary | ICD-10-CM | POA: Diagnosis not present

## 2023-08-14 ENCOUNTER — Ambulatory Visit: Payer: PPO | Admitting: Gastroenterology

## 2023-08-14 ENCOUNTER — Encounter: Payer: Self-pay | Admitting: Gastroenterology

## 2023-08-14 VITALS — BP 118/78 | HR 64 | Temp 97.7°F | Resp 13 | Ht 71.0 in | Wt 270.0 lb

## 2023-08-14 DIAGNOSIS — F419 Anxiety disorder, unspecified: Secondary | ICD-10-CM | POA: Diagnosis not present

## 2023-08-14 DIAGNOSIS — Z09 Encounter for follow-up examination after completed treatment for conditions other than malignant neoplasm: Secondary | ICD-10-CM | POA: Diagnosis not present

## 2023-08-14 DIAGNOSIS — E119 Type 2 diabetes mellitus without complications: Secondary | ICD-10-CM | POA: Diagnosis not present

## 2023-08-14 DIAGNOSIS — Z860100 Personal history of colon polyps, unspecified: Secondary | ICD-10-CM

## 2023-08-14 DIAGNOSIS — G4730 Sleep apnea, unspecified: Secondary | ICD-10-CM | POA: Diagnosis not present

## 2023-08-14 DIAGNOSIS — D123 Benign neoplasm of transverse colon: Secondary | ICD-10-CM

## 2023-08-14 DIAGNOSIS — K635 Polyp of colon: Secondary | ICD-10-CM | POA: Diagnosis not present

## 2023-08-14 MED ORDER — FLEET ENEMA RE ENEM
1.0000 | ENEMA | Freq: Once | RECTAL | Status: AC
  Administered 2023-08-14: 1 via RECTAL

## 2023-08-14 MED ORDER — SODIUM CHLORIDE 0.9 % IV SOLN: 500.0000 mL | Freq: Once | INTRAVENOUS | Status: DC

## 2023-08-14 NOTE — Progress Notes (Signed)
[facility] History and Physical   Primary Care Physician:  [name], MD   Reason for Procedure:   History of colon polyps  Plan:    colonoscopy     HPI: Bryan Kirby is a 67 y.o. male  here for colonoscopy surveillance - history of polyps. Last exam 01/2021 - polyp removed but fair prep noted. 2 day prep for this exam. Took first prep but only half of the second prep. States it is liquid output and nothing solid. He has some baseline constipation   No family history of colon cancer known. Otherwise feels well without any cardiopulmonary symptoms.   I have discussed risks / benefits of anesthesia and endoscopic procedure with [name] and they wish to proceed with the exams as outlined today.    Past Medical History:  Diagnosis Date   Allergic rhinitis due to pollen    Anxiety    Arthritis    Clotting disorder [facility])    Colon polyps    Controlled type 2 diabetes mellitus without complication, without long-term current use of insulin (HCC) 03/02/2022   DVT (deep venous thrombosis) (HCC) 2000   Right leg   GERD (gastroesophageal reflux disease)    Hyperlipidemia    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/16/2007   Qualifier: Diagnosis of  By: Hetty Ely MD, Franne Grip    Sleep apnea    USES CPAP    Past Surgical History:  Procedure Laterality Date   CARPAL TUNNEL RELEASE Right    CYSTECTOMY     EYE SURGERY     KNEE ARTHROSCOPY     LEFT LAT MENISCUS TEAR   KNEE SURGERY     LEFT KNEE CAP WIRING AND PATELLAR REATTACHMENT   LEFT HEART CATHETERIZATION WITH CORONARY ANGIOGRAM N/A 10/02/2014   Procedure: LEFT HEART CATHETERIZATION WITH CORONARY ANGIOGRAM;  Surgeon: Thurmon Fair, MD;  Location: [facility] CATH LAB;  Service: Cardiovascular;  Laterality: N/A;   ORCHIECTOMY     MALDEVELOPEMENT AFTER ORCHITIS   POLYPECTOMY     OF PENIS    Prior to Admission medications   Medication Sig Start Date End Date Taking? Authorizing Provider  aspirin  81 MG chewable tablet Chew 81 mg by mouth daily.    [provider]  betamethasone valerate ointment (VALISONE) 0.1 % Apply 1 Application topically 2 (two) times daily. Patient not taking: Reported on 07/24/2023 01/23/23   Copland, Karleen Hampshire, MD  buPROPion (WELLBUTRIN XL) 150 MG 24 hr tablet Take 1 tablet (150 mg total) by mouth daily. 05/25/23   Copland, Karleen Hampshire, MD  diclofenac sodium (VOLTAREN) 1 % GEL Apply 4 g topically 4 (four) times daily as needed. Apply to large joint PRN Patient not taking: Reported on 07/24/2023 02/08/18   Valeria Batman, MD  escitalopram (LEXAPRO) 20 MG tablet TAKE 1 TABLET BY MOUTH DAILY 04/11/23   Copland, Karleen Hampshire, MD  fexofenadine (ALLEGRA) 180 MG tablet Take 1 tablet (180 mg total) by mouth daily. Patient not taking: Reported on 07/24/2023 10/07/20   Martina Sinner, MD  IRON CR PO Take by mouth.    [provider]  Methylcobalamin (B12-ACTIVE PO) Take by mouth.    [provider]  Multiple Vitamin (MULTIVITAMIN WITH MINERALS) TABS tablet Take 1 tablet by mouth daily. Patient not taking: Reported on 07/24/2023    [provider]  pantoprazole (PROTONIX) 40 MG tablet TAKE 1 TABLET BY MOUTH DAILY 05/08/23   Copland, Karleen Hampshire, MD  simvastatin (ZOCOR) 40 MG tablet TAKE 1 TABLET BY  MOUTH AT BEDTIME 04/11/23   Copland, Karleen Hampshire, MD    Current Outpatient Medications  Medication Sig Dispense Refill   aspirin 81 MG chewable tablet Chew 81 mg by mouth daily.     betamethasone valerate ointment (VALISONE) 0.1 % Apply 1 Application topically 2 (two) times daily. (Patient not taking: Reported on 07/24/2023) 45 g 0   buPROPion (WELLBUTRIN XL) 150 MG 24 hr tablet Take 1 tablet (150 mg total) by mouth daily. 90 tablet 1   diclofenac sodium (VOLTAREN) 1 % GEL Apply 4 g topically 4 (four) times daily as needed. Apply to large joint PRN (Patient not taking: Reported on 07/24/2023) 3 Tube 4   escitalopram (LEXAPRO) 20 MG tablet TAKE 1 TABLET BY MOUTH  DAILY 90 tablet 3   fexofenadine (ALLEGRA) 180 MG tablet Take 1 tablet (180 mg total) by mouth daily. (Patient not taking: Reported on 07/24/2023) 30 tablet 11   IRON CR PO Take by mouth.     Methylcobalamin (B12-ACTIVE PO) Take by mouth.     Multiple Vitamin (MULTIVITAMIN WITH MINERALS) TABS tablet Take 1 tablet by mouth daily. (Patient not taking: Reported on 07/24/2023)     pantoprazole (PROTONIX) 40 MG tablet TAKE 1 TABLET BY MOUTH DAILY 90 tablet 3   simvastatin (ZOCOR) 40 MG tablet TAKE 1 TABLET BY MOUTH AT BEDTIME 90 tablet 3   Current Facility-Administered Medications  Medication Dose Route Frequency Provider Last Rate Last Admin   0.9 %  sodium chloride infusion  500 mL Intravenous Once Ashleigh Luckow, Willaim Rayas, MD        Allergies as of 08/14/2023 - Review Complete 08/14/2023  Allergen Reaction Noted   Codeine  03/15/2007   Penicillins Itching 03/15/2007    Family History  Problem Relation Age of Onset   Fibromyalgia Mother    Depression Mother    Cancer Father        PROSTATECTOMY   Stroke Father    Cancer Sister        CERVICAL   Stroke Paternal Grandfather    Hypertension Paternal Grandfather    Diabetes Paternal Grandfather    Colon polyps Neg Hx    Colon cancer Neg Hx    Esophageal cancer Neg Hx    Stomach cancer Neg Hx    Rectal cancer Neg Hx     Social History   Socioeconomic History   Marital status: Married    Spouse name: Not on file   Number of children: 1   Years of education: Not on file   Highest education level: Not on file  Occupational History   Occupation: PLUMBER  Tobacco Use   Smoking status: Former    Current packs/day: 0.00    Average packs/day: 0.5 packs/day for 1 year (0.5 ttl pk-yrs)    Types: Cigarettes    Start date: 10/03/1972    Quit date: 10/03/1973    Years since quitting: 49.8   Smokeless tobacco: Never  Vaping Use   Vaping status: Never Used  Substance and Sexual Activity   Alcohol use: No   Drug use: No   Sexual  activity: Not on file  Other Topics Concern   Not on file  Social History Narrative   Not on file   Social Determinants of Health   Financial Resource Strain: Not on file  Food Insecurity: Not on file  Transportation Needs: Not on file  Physical Activity: Not on file  Stress: Not on file  Social Connections: Not on file  Intimate Partner Violence:  Not on file    Review of Systems: All other review of systems negative except as mentioned in the HPI.  Physical Exam: Vital signs BP (!) 116/49   Pulse 76   Temp 97.7 F (36.5 C)   Ht 5\' 11"  (1.803 m)   Wt 270 lb (122.5 kg)   SpO2 99%   BMI 37.66 kg/m   General:   Alert,  Well-developed, pleasant and cooperative in NAD Lungs:  Clear throughout to auscultation.   Heart:  Regular rate and rhythm Abdomen:  Soft, nontender and nondistended.   Neuro/Psych:  Alert and cooperative. Normal mood and affect. A and O x 3  Harlin Rain, MD [facility]

## 2023-08-14 NOTE — Op Note (Signed)
[facility] Patient Name: Bryan Kirby Procedure Date: 08/14/2023 10:00 AM MRN: [id number] Endoscopist: Viviann Spare P. Adela Lank , MD, [id number] Age: 67 Referring MD:  Date of Birth: [birth year] Gender: Male Account #: [id number] Procedure:                Colonoscopy Indications:              High risk colon cancer surveillance: Personal                            history of colonic polyps - 1 polyp removed 01/2021                            however fair prep at that time. Double prep for                            this exam however patient had difficulty with                            taking 1/2 of the second half of the prep. Medicines:                Monitored Anesthesia Care Procedure:                Pre-Anesthesia Assessment:                           - Prior to the procedure, a History and Physical                            was performed, and patient medications and                            allergies were reviewed. The patient's tolerance of                            previous anesthesia was also reviewed. The risks                            and benefits of the procedure and the sedation                            options and risks were discussed with the patient.                            All questions were answered, and informed consent                            was obtained. Prior Anticoagulants: The patient has                            taken no anticoagulant or antiplatelet agents. ASA                            Grade Assessment: III - A patient with severe  systemic disease. After reviewing the risks and                            benefits, the patient was deemed in satisfactory                            condition to undergo the procedure.                           After obtaining informed consent, the colonoscope                            was passed under direct vision. Throughout the                            procedure, the  patient's blood pressure, pulse, and                            oxygen saturations were monitored continuously. The                            Olympus CF-HQ190L ([id number]) Colonoscope was                            introduced through the anus and advanced to the the                            cecum, identified by appendiceal orifice and                            ileocecal valve. The colonoscopy was performed                            without difficulty. The patient tolerated the                            procedure well. The quality of the bowel                            preparation was mostly adequate. The ileocecal                            valve, appendiceal orifice, and rectum were                            photographed. Scope In: 10:12:13 AM Scope Out: 10:30:49 AM Scope Withdrawal Time: 0 hours 11 minutes 1 second  Total Procedure Duration: 0 hours 18 minutes 36 seconds  Findings:                 The perianal and digital rectal examinations were                            normal.  A large amount of liquid stool was found in the                            entire colon, making visualization difficult                            itially. Lavage of the colon was performed using                            copious amounts of sterile water, resulting in                            clearance with mostly adequate visualization. The                            cecal cap was most affected - there was some                            residual stool / seeds in this area which was                            difficult to completely clear around the AO,                            however no polyps were obvious in this area.                           A 3 mm polyp was found in the transverse colon. The                            polyp was sessile. The polyp was removed with a                            cold snare. Resection and retrieval were complete.                            Internal hemorrhoids were found during retroflexion.                           The colon was long and redundant. The exam was                            otherwise without abnormality. Complications:            No immediate complications. Estimated blood loss:                            Minimal. Estimated Blood Loss:     Estimated blood loss was minimal. Impression:               - Stool in the entire examined colon leading to                            extensive  lavage.                           - One 3 mm polyp in the transverse colon, removed                            with a cold snare. Resected and retrieved.                           - Internal hemorrhoids.                           - Long / redundant colon.                           - The examination was otherwise normal. Recommendation:           - Patient has a contact number available for                            emergencies. The signs and symptoms of potential                            delayed complications were discussed with the                            patient. Return to normal activities tomorrow.                            Written discharge instructions were provided to the                            patient.                           - Resume previous diet.                           - Continue present medications.                           - Await pathology results.                           - Double prep should be used for all future exams. Viviann Spare P. Tanush Drees, MD 08/14/2023 10:36:49 AM This report has been signed electronically.

## 2023-08-14 NOTE — Progress Notes (Signed)
Called to room to assist during endoscopic procedure.  Patient ID and intended procedure confirmed with present staff. Received instructions for my participation in the procedure from the performing physician.  

## 2023-08-14 NOTE — Progress Notes (Signed)
Sedate, gd SR, tolerated procedure well, VSS, report to RN 

## 2023-08-14 NOTE — Progress Notes (Signed)
Upon arrival, pt states that he was unable to finish the 1st part of suprep last night and most of the prep this morning.  He finished the miralax Saturday night.  He was able to move his bowels and on assessment they are dark brown liquid.  Per Dr. Adela Lank, will administer 1 enema.    Pt states that results after enema are slightly lighter than before.

## 2023-08-14 NOTE — Patient Instructions (Signed)

## 2023-08-15 ENCOUNTER — Telehealth: Payer: Self-pay

## 2023-08-15 NOTE — Telephone Encounter (Signed)
Attempted to reach patient for post-procedure f/u call. No answer. Left message for him to please not hesitate to call if he has any questions/concerns regarding his care. 

## 2023-08-18 LAB — SURGICAL PATHOLOGY

## 2023-08-23 ENCOUNTER — Encounter: Payer: Self-pay | Admitting: Gastroenterology

## 2023-11-22 ENCOUNTER — Encounter: Payer: Self-pay | Admitting: Pulmonary Disease

## 2023-11-22 DIAGNOSIS — H2513 Age-related nuclear cataract, bilateral: Secondary | ICD-10-CM | POA: Diagnosis not present

## 2023-11-22 DIAGNOSIS — H43811 Vitreous degeneration, right eye: Secondary | ICD-10-CM | POA: Diagnosis not present

## 2023-12-04 ENCOUNTER — Ambulatory Visit: Payer: Self-pay | Admitting: Podiatry

## 2023-12-04 ENCOUNTER — Encounter: Payer: Self-pay | Admitting: Podiatry

## 2023-12-04 ENCOUNTER — Ambulatory Visit (INDEPENDENT_AMBULATORY_CARE_PROVIDER_SITE_OTHER)

## 2023-12-04 DIAGNOSIS — M722 Plantar fascial fibromatosis: Secondary | ICD-10-CM

## 2023-12-04 DIAGNOSIS — M7732 Calcaneal spur, left foot: Secondary | ICD-10-CM | POA: Diagnosis not present

## 2023-12-04 MED ORDER — TRIAMCINOLONE ACETONIDE 10 MG/ML IJ SUSP
10.0000 mg | Freq: Once | INTRAMUSCULAR | Status: AC
  Administered 2023-12-04: 10 mg via INTRA_ARTICULAR

## 2023-12-04 NOTE — Progress Notes (Unsigned)
 Orthotics   Patient was present and evaluated for Custom molded foot orthotics. Patient will benefit from CFO's to provide total contact to BIL MLA's helping to balance and distribute body weight more evenly across BIL feet helping to reduce plantar pressure and pain. Orthotic will also encourage FF / RF alignment  Patient was scanned today and will return for fitting upon receipt   HTA auth needed prior to ordering

## 2023-12-05 NOTE — Progress Notes (Signed)
 Subjective:   Patient ID: Bryan Kirby, male   DOB: 68 y.o.   MRN: [id number]   HPI Patient presents stating he is having a lot of pain in his left heel.  Patient had a history of orthotics but are over 53 years old and are no longer providing proper support and patient does have significant obesity issues with diabetes under good control   Review of Systems  All other systems reviewed and are negative.       Objective:  Physical Exam Vitals and nursing note reviewed.  Constitutional:      Appearance: He is well-developed.  Pulmonary:     Effort: Pulmonary effort is normal.  Musculoskeletal:        General: Normal range of motion.  Skin:    General: Skin is warm.  Neurological:     Mental Status: He is alert.     Neurovascular status found to be intact muscle strength was found to be adequate range of motion adequate.  Patient does have depression of the arch with stress on the plantar fascia with exquisite discomfort medial band left at the insertional point tendon into the calcaneus.  Good digital perfusion well-oriented     Assessment:  Acute plantar fasciitis left with inflammation fluid medial band at insertion     Plan:  H&P reviewed condition and x-rays.  Sterile prep and injected the plantar fascia at insertion 3 mg Kenalog 5 mg Xylocaine and applied fascial brace to lift up the arch.  Gave instructions for long-term orthotics to provide for proper arch support and prevent reoccurrence of deformity and casting done today by certified pedorthist  X-rays indicate spur formation no indications of stress fracture moderate depression of the arch

## 2023-12-06 ENCOUNTER — Ambulatory Visit: Payer: Self-pay | Admitting: Podiatry

## 2023-12-15 ENCOUNTER — Telehealth: Payer: Self-pay

## 2023-12-15 NOTE — Telephone Encounter (Signed)
 [employer] FOR [place] , [place] # [id number] VALID THRU 12/05/23-03/04/24

## 2023-12-19 NOTE — Telephone Encounter (Signed)
 Order placed for orthotics will call patient when in for fitting  Bryan Kirby Cped, CFo, CFm

## 2023-12-29 ENCOUNTER — Telehealth: Payer: Self-pay

## 2023-12-29 NOTE — Telephone Encounter (Signed)
 LEFT VM FOR ORTHOTIC PICK UP

## 2024-01-17 NOTE — Progress Notes (Unsigned)
 Referring Physician:  No referring provider defined for this encounter.  Primary Physician:  [name], MD  History of Present Illness: 01/18/2024 [name] has a history of OSA, DM, hyperlipidemia, obesity, depression.   History of chronic neck and back pain x years, has been worse in last 2 months. Lumbar surgery recommended years ago and he declined.   He has constant neck pain with radiation to left elbow. He has numbness, weakness, and tingling in his left arm. Pain is worse with laying on his left side and using his left arm. No alleviating factors.   MRI of cervical spine in 2014 showed cervical spondylosis with severe foraminal stenosis C6-C7.   He also has constant LBP with no leg pain. Pain varies in severity.  He has intermittent numbness in right leg- when he sleeps on right side. He notes some weakness in both legs. Pain is worse with walking on an incline, bending, twisting.   He works part time as a Nutritional therapist.   He is taking motrin with some relief.   He quit smoking in 1975.   Bowel/Bladder Dysfunction: none  Conservative measures:  Physical therapy:  has not participated in PT Multimodal medical therapy including regular antiinflammatories: none Injections:  Cervical injections 15+ years ago with some relief No lumbar injections  Past Surgery: none  Bryan Kirby has no symptoms of cervical myelopathy.  The symptoms are causing a significant impact on the patient's life.   Review of Systems:  A 10 point review of systems is negative, except for the pertinent positives and negatives detailed in the HPI.  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   Qualifier: Diagnosis of  By: Hetty Ely MD, Franne Grip    Sleep apnea    USES CPAP    Past Surgical History: Past Surgical History:  Procedure Laterality Date   CARPAL TUNNEL RELEASE Right    CYSTECTOMY     EYE SURGERY     KNEE ARTHROSCOPY     LEFT LAT MENISCUS TEAR   KNEE SURGERY     LEFT KNEE CAP WIRING AND PATELLAR REATTACHMENT   LEFT HEART CATHETERIZATION WITH CORONARY ANGIOGRAM N/A 10/02/2014   Procedure: LEFT HEART CATHETERIZATION WITH CORONARY ANGIOGRAM;  Surgeon: Thurmon Fair, MD;  Location: [facility];  Service: Cardiovascular;  Laterality: N/A;   ORCHIECTOMY     MALDEVELOPEMENT AFTER ORCHITIS   POLYPECTOMY     OF PENIS    Allergies: Allergies as of 01/18/2024 - Review Complete 01/18/2024  Allergen Reaction Noted   Codeine  03/15/2007   Penicillins Itching 03/15/2007    Medications: Outpatient Encounter Medications as of 01/18/2024  Medication Sig   aspirin 81 MG chewable tablet Chew 81 mg by mouth daily.   buPROPion (WELLBUTRIN XL) 150 MG 24 hr tablet Take 1 tablet (150 mg total) by mouth daily.   IRON CR PO Take by mouth.   Methylcobalamin (B12-ACTIVE PO) Take by mouth.   pantoprazole (PROTONIX) 40 MG tablet TAKE 1 TABLET BY MOUTH DAILY   simvastatin (ZOCOR)  40 MG tablet TAKE 1 TABLET BY MOUTH AT BEDTIME   [DISCONTINUED] escitalopram (LEXAPRO) 20 MG tablet TAKE 1 TABLET BY MOUTH DAILY   No facility-administered encounter medications on file as of 01/18/2024.    Social History: Social History   Tobacco Use   Smoking status: Former    Current packs/day: 0.00    Average packs/day: 0.5 packs/day for 1 year (0.5 ttl pk-yrs)    Types: Cigarettes    Start date: 10/03/1972    Quit date: 10/03/1973    Years since quitting: 50.3   Smokeless tobacco: Never  Vaping Use   Vaping status: Never Used  Substance Use Topics   Alcohol use: No    Drug use: No    Family Medical History: Family History  Problem Relation Age of Onset   Fibromyalgia Mother    Depression Mother    Cancer Father        PROSTATECTOMY   Stroke Father    Cancer Sister        CERVICAL   Stroke Paternal Grandfather    Hypertension Paternal Grandfather    Diabetes Paternal Grandfather    Colon polyps Neg Hx    Colon cancer Neg Hx    Esophageal cancer Neg Hx    Stomach cancer Neg Hx    Rectal cancer Neg Hx     Physical Examination: Vitals:   01/18/24 0838  BP: 118/80    General: Patient is well developed, well nourished, calm, collected, and in no apparent distress. Attention to examination is appropriate.  Respiratory: Patient is breathing without any difficulty.   NEUROLOGICAL:     Awake, alert, oriented to person, place, and time.  Speech is clear and fluent. Fund of knowledge is appropriate.   Cranial Nerves: Pupils equal round and reactive to light.  Facial tone is symmetric.    No posterior cervical tenderness. Mild tenderness in left trapezial region.   No posterior lumbar tenderness.   No abnormal lesions on exposed skin.   Strength: Side Biceps Triceps Deltoid Interossei Grip Wrist Ext. Wrist Flex.  R 5 5 5 5 5 5 5   L 5 5 5 5 5 5 5    Side Iliopsoas Quads Hamstring PF DF EHL  R 5 5 5 5 5 5   L 5 5 5 5 5 5    Reflexes are 2+ and symmetric at the biceps, brachioradialis, patella and achilles.   Hoffman's is absent.  Clonus is not present.   Bilateral upper and lower extremity sensation is intact to light touch.   Good ROM of both hips with no pain.   Good ROM of both shoulders, but he has pain on left. He has pain with IR/ER of left shoulder and pain with stress of rotator cuff.   Gait is normal.     Medical Decision Making  Imaging: No recent cervical or lumbar imaging.   Assessment and Plan: Mr. Rammel has a history of chronic neck and back pain x years, has been worse in last 2 months. Lumbar surgery  recommended years ago and he declined.   He has constant neck pain with radiation to left elbow. He has numbness, weakness, and tingling in his left arm.   MRI of cervical spine in 2014 showed cervical spondylosis with severe foraminal stenosis C6-C7.   He has pain with ROM of left shoulder and has pain with stress of RC- this pain is likely shoulder mediated.   He also has constant LBP with no leg pain. Pain varies  in severity.  He has intermittent numbness in right leg- when he sleeps on right side. He notes some weakness in both legs.  No lumbar imaging done. Pain appears to be lumbar mediated.   Treatment options discussed with patient and following plan made:   - MRI of cervical spine to evaluate chronic neck pain and left cervical radiculopathy.  - MRI of lumbar spine to evaluate chronic LBP.  - Referral to ortho ([facility] in [place]) for left shoulder pain.  - Will schedule phone visit to review MRI results once I get them back.   I spent a total of 30 minutes in face-to-face and non-face-to-face activities related to this patient's care today including review of outside records, review of imaging, review of symptoms, physical exam, discussion of differential diagnosis, discussion of treatment options, and documentation.   Thank you for involving me in the care of this patient.   Lucetta Russel PA-C Dept. of Neurosurgery

## 2024-01-18 ENCOUNTER — Ambulatory Visit: Admitting: Orthopedic Surgery

## 2024-01-18 ENCOUNTER — Encounter: Payer: Self-pay | Admitting: Orthopedic Surgery

## 2024-01-18 VITALS — BP 118/80 | Ht 71.0 in | Wt 279.0 lb

## 2024-01-18 DIAGNOSIS — G8929 Other chronic pain: Secondary | ICD-10-CM | POA: Diagnosis not present

## 2024-01-18 DIAGNOSIS — M4802 Spinal stenosis, cervical region: Secondary | ICD-10-CM | POA: Diagnosis not present

## 2024-01-18 DIAGNOSIS — M4722 Other spondylosis with radiculopathy, cervical region: Secondary | ICD-10-CM | POA: Diagnosis not present

## 2024-01-18 DIAGNOSIS — M542 Cervicalgia: Secondary | ICD-10-CM

## 2024-01-18 DIAGNOSIS — M5412 Radiculopathy, cervical region: Secondary | ICD-10-CM

## 2024-01-18 DIAGNOSIS — M47812 Spondylosis without myelopathy or radiculopathy, cervical region: Secondary | ICD-10-CM

## 2024-01-18 DIAGNOSIS — M545 Low back pain, unspecified: Secondary | ICD-10-CM

## 2024-01-18 DIAGNOSIS — M25512 Pain in left shoulder: Secondary | ICD-10-CM | POA: Diagnosis not present

## 2024-01-18 NOTE — Patient Instructions (Signed)
 It was so nice to see you today. Thank you so much for coming in.    I want to get an MRI of your neck and lower back to look into things further. We will get this approved through your insurance and [facility] will call you to schedule the appointment. Ask about your patient responsibility. You do not need to pay this prior to getting MRI, they can bill you.   [facility] (building with the white pillars) is located off of [address]. The address is [address], [place], [place] [address].    After you have the MRI scans done, it takes 14-21 days for me to get the results back. Once I have them, we will call you to schedule a follow up phone visit with me to review them.   I want you to see ortho at [facility] in [place] for your left shoulder. They should call you to schedule an appointment or you can call them at the number below.   Please do not hesitate to call if you have any questions or concerns. You can also message me in MyChart.   Lucetta Russel PA-C [phone]     The physicians and staff at [facility] at [facility] are committed to providing excellent care. You may receive a survey asking for feedback about your experience at our office. We value you your feedback and appreciate you taking the time to to fill it out. The [facility] leadership team is also available to discuss your experience in person, feel free to contact us  [phone].

## 2024-01-26 ENCOUNTER — Ambulatory Visit
Admission: RE | Admit: 2024-01-26 | Discharge: 2024-01-26 | Discharge status: Home / Self Care | Source: Ambulatory Visit | Attending: Orthopedic Surgery | Admitting: Orthopedic Surgery

## 2024-01-26 ENCOUNTER — Ambulatory Visit
Admission: RE | Admit: 2024-01-26 | Discharge: 2024-01-26 | Disposition: A | Discharge status: Home / Self Care | Source: Ambulatory Visit | Attending: Orthopedic Surgery | Admitting: Orthopedic Surgery

## 2024-01-26 DIAGNOSIS — M48061 Spinal stenosis, lumbar region without neurogenic claudication: Secondary | ICD-10-CM | POA: Diagnosis not present

## 2024-01-26 DIAGNOSIS — M47812 Spondylosis without myelopathy or radiculopathy, cervical region: Secondary | ICD-10-CM | POA: Insufficient documentation

## 2024-01-26 DIAGNOSIS — M545 Low back pain, unspecified: Secondary | ICD-10-CM

## 2024-01-26 DIAGNOSIS — M5412 Radiculopathy, cervical region: Secondary | ICD-10-CM | POA: Insufficient documentation

## 2024-01-26 DIAGNOSIS — M5126 Other intervertebral disc displacement, lumbar region: Secondary | ICD-10-CM | POA: Diagnosis not present

## 2024-01-26 DIAGNOSIS — M542 Cervicalgia: Secondary | ICD-10-CM | POA: Diagnosis not present

## 2024-01-26 DIAGNOSIS — M50221 Other cervical disc displacement at C4-C5 level: Secondary | ICD-10-CM | POA: Diagnosis not present

## 2024-01-26 DIAGNOSIS — M47816 Spondylosis without myelopathy or radiculopathy, lumbar region: Secondary | ICD-10-CM | POA: Diagnosis not present

## 2024-01-26 DIAGNOSIS — M5021 Other cervical disc displacement,  high cervical region: Secondary | ICD-10-CM | POA: Diagnosis not present

## 2024-01-26 DIAGNOSIS — M5127 Other intervertebral disc displacement, lumbosacral region: Secondary | ICD-10-CM | POA: Diagnosis not present

## 2024-01-26 DIAGNOSIS — G8929 Other chronic pain: Secondary | ICD-10-CM | POA: Diagnosis not present

## 2024-01-26 DIAGNOSIS — M4802 Spinal stenosis, cervical region: Secondary | ICD-10-CM | POA: Diagnosis not present

## 2024-02-01 ENCOUNTER — Ambulatory Visit (HOSPITAL_BASED_OUTPATIENT_CLINIC_OR_DEPARTMENT_OTHER): Admitting: Orthopaedic Surgery

## 2024-02-01 ENCOUNTER — Ambulatory Visit (HOSPITAL_BASED_OUTPATIENT_CLINIC_OR_DEPARTMENT_OTHER)

## 2024-02-01 DIAGNOSIS — M25512 Pain in left shoulder: Secondary | ICD-10-CM | POA: Diagnosis not present

## 2024-02-01 DIAGNOSIS — G8929 Other chronic pain: Secondary | ICD-10-CM | POA: Diagnosis not present

## 2024-02-01 DIAGNOSIS — M19012 Primary osteoarthritis, left shoulder: Secondary | ICD-10-CM | POA: Diagnosis not present

## 2024-02-01 NOTE — Progress Notes (Signed)
 Chief Complaint: Left shoulder pain     History of Present Illness:    Bryan Kirby is a 68 y.o. male presents today with ongoing left shoulder pain and overhead weakness with range of motion.  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  He does have a history of a cervical disc herniation.  He does have some pain that radiates about the left aspect of the posterior shoulder and scapula.  He is right-hand dominant.  He has not had previous injections.  He has trialed anti-inflammatories without relief.  He has had 2 cervical injections in the past the first which gave him multiple years of relief.  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   Qualifier: Diagnosis of  By: Roxine Cordia MD, Candie Chamber    Sleep apnea    USES CPAP   Past Surgical History:  Procedure Laterality Date   CARPAL TUNNEL RELEASE Right    CYSTECTOMY     EYE SURGERY     KNEE ARTHROSCOPY     LEFT LAT MENISCUS TEAR   KNEE SURGERY     LEFT KNEE CAP WIRING AND PATELLAR REATTACHMENT   LEFT HEART CATHETERIZATION WITH CORONARY ANGIOGRAM N/A 10/02/2014   Procedure: LEFT HEART CATHETERIZATION WITH CORONARY ANGIOGRAM;  Surgeon: Luana Rumple, MD;  Location: [facility]ATH [facility];  Service: Cardiovascular;  Laterality: N/A;   ORCHIECTOMY     MALDEVELOPEMENT AFTER ORCHITIS   POLYPECTOMY     OF PENIS   Social History    Socioeconomic History   Marital status: Married    Spouse name: Not on file   Number of children: 1   Years of education: Not on file   Highest education level: Not on file  Occupational History   Occupation: PLUMBER  Tobacco Use   Smoking status: Former    Current packs/day: 0.00    Average packs/day: 0.5 packs/day for 1 year (0.5 ttl pk-yrs)    Types: Cigarettes    Start date: 10/03/1972    Quit date: 10/03/1973    Years since quitting: 50.3   Smokeless tobacco: Never  Vaping Use   Vaping status: Never Used  Substance and Sexual Activity   Alcohol use: No   Drug use: No   Sexual activity: Not on file  Other Topics Concern   Not on file  Social History Narrative   Not on file   Social Drivers of Health   Financial Resource Strain: Not on file  Food Insecurity: Not on file  Transportation Needs: Not on file  Physical Activity: Not on file  Stress: Not on file  Social Connections: Not on file   Family History  Problem Relation Age of Onset   Fibromyalgia Mother    Depression Mother    Cancer Father        PROSTATECTOMY  Stroke Father    Cancer Sister        CERVICAL   Stroke Paternal Grandfather    Hypertension Paternal Grandfather    Diabetes Paternal Grandfather    Colon polyps Neg Hx    Colon cancer Neg Hx    Esophageal cancer Neg Hx    Stomach cancer Neg Hx    Rectal cancer Neg Hx    Allergies  Allergen Reactions   Codeine     REACTION: itching   Penicillins Itching    REACTION: UNSPECIFIED   Current Outpatient Medications  Medication Sig Dispense Refill   aspirin  81 MG chewable tablet Chew 81 mg by mouth daily.     buPROPion  (WELLBUTRIN  XL) 150 MG 24 hr tablet Take 1 tablet (150 mg total) by mouth daily. 90 tablet 1   IRON CR PO Take by mouth.     Methylcobalamin (B12-ACTIVE PO) Take by mouth.     pantoprazole  (PROTONIX ) 40 MG tablet TAKE 1 TABLET BY MOUTH DAILY 90 tablet 3   simvastatin  (ZOCOR ) 40 MG tablet TAKE 1 TABLET BY MOUTH AT BEDTIME  90 tablet 3   No current facility-administered medications for this visit.   No results found.  Review of Systems:   A ROS was performed including pertinent positives and negatives as documented in the HPI.  Physical Exam :   Constitutional: NAD and appears stated age Neurological: Alert and oriented Psych: Appropriate affect and cooperative There were no vitals taken for this visit.   Comprehensive Musculoskeletal Exam:    Forward elevation is to 160 degrees without pain in the left shoulder.  He does have 4 out of 5 strength on the left compared to the right.  External rotation bilaterally is to 50 degrees with internal rotation bilaterally to L1, negative belly press.  Positive Neer impingement again with weakness   Imaging:   Xray (3 views left shoulder): Mild superior migration of the humerus without evidence of glenohumeral osteoarthritis     I personally reviewed and interpreted the radiographs.   Assessment and Plan:   68 y.o. male right-hand-dominant male with left shoulder pain which I do believe is multifactorial.  He does have a known left-sided C6-C7 cervical disc herniation which I do believe may be exacerbating his symptoms.  I do believe he also may have a rotator cuff injury for which I would like to obtain an MRI of the shoulder.  I would like him to get an opinion on surgery with Dr. Jeris Montes to discuss if any type of intervention is needed on this side as he has had previous significant relief from a cervical injection.  Ultimately I did discuss that should he also have a rotator cuff injury that typically I would recommend treating the cervical spine first if needed in an effort to optimize his functionality postoperatively as he recovers from the shoulder.  -Return to clinic following MRI left shoulder   I personally saw and evaluated the patient, and participated in the management and treatment plan.  Wilhelmenia Harada, MD Attending Physician, Orthopedic  Surgery  This document was dictated using Dragon voice recognition software. A reasonable attempt at proof reading has been made to minimize errors.

## 2024-02-02 ENCOUNTER — Ambulatory Visit
Admission: RE | Admit: 2024-02-02 | Discharge: 2024-02-02 | Disposition: A | Discharge status: Home / Self Care | Source: Ambulatory Visit | Attending: Orthopaedic Surgery | Admitting: Orthopaedic Surgery

## 2024-02-02 DIAGNOSIS — G8929 Other chronic pain: Secondary | ICD-10-CM | POA: Diagnosis not present

## 2024-02-02 DIAGNOSIS — M19012 Primary osteoarthritis, left shoulder: Secondary | ICD-10-CM | POA: Diagnosis not present

## 2024-02-02 DIAGNOSIS — M25512 Pain in left shoulder: Secondary | ICD-10-CM | POA: Diagnosis not present

## 2024-02-02 DIAGNOSIS — M75102 Unspecified rotator cuff tear or rupture of left shoulder, not specified as traumatic: Secondary | ICD-10-CM | POA: Diagnosis not present

## 2024-02-02 NOTE — Progress Notes (Unsigned)
 Telephone Visit- Progress Note: Referring Physician:  Scherrie Curt, MD [address] [place],  [place] [address]  Primary Physician:  Scherrie Curt, MD  This visit was performed via telephone.  Patient location: home Provider location: office  I spent a total of 10 minutes non-face-to-face activities for this visit on the date of this encounter including review of current clinical condition and response to treatment.    Patient has given verbal consent to this telephone visits and we reviewed the limitations of a telephone visit. Patient wishes to proceed.    Chief Complaint:  review imaging  History of Present Illness: DAXON GREASON is a 68 y.o. male has a history of OSA, DM, hyperlipidemia, obesity, depression.    History of chronic neck and back pain x years, has been worse in last 2 months. Lumbar surgery recommended years ago and he declined.   Last seen by me on 01/18/24 for constant neck pain with left arm pain along with constant LBP. MRI of cervical spine in 2014 showed cervical spondylosis with severe foraminal stenosis C6-C7. No lumbar imaging done.   He was referred to ortho for his left shoulder and MRI of cervical/lumbar spine were ordered.   He saw Dr. Hermina Loosen on 02/01/24 for left shoulder pain. MRI of left shoulder was ordered. He recommended possible cervical surgery prior to any shoulder surgery.   Phone visit scheduled to discuss his MRI results.   He is about the same.   He has constant neck pain with radiation to his left shoulder. He has numbness, weakness, and tingling in his left arm. Pain is worse with using his left arm. No alleviating factors. No right arm pain.    He also has constant LBP with no leg pain. Pain varies in severity. He has intermittent numbness/pain in right leg (posterior/medial thigh)- when he sleeps on right side. He notes some weakness in both legs. Pain is worse with walking on an incline, bending, twisting.    He  works part time as a Nutritional therapist.    He is taking motrin  with some relief.    He quit smoking in 1975.    Bowel/Bladder Dysfunction: none   Conservative measures:  Physical therapy:  has not participated in PT Multimodal medical therapy including regular antiinflammatories: none Injections:  Cervical injections 15+ years ago with some relief No lumbar injections   Past Surgery: none   Brydan L Kirkland has no symptoms of cervical myelopathy.   The symptoms are causing a significant impact on the patient's life.    Exam: No exam done as this was a telephone encounter.     Imaging: MRI of cervical spine dated 01/26/24:  FINDINGS: Alignment: Straightening of the normal cervical lordosis. Trace 2 mm facet mediated anterolisthesis of C3 on C4, C4 on C5, C7 on T1, and T1 on T2.   Vertebrae: Vertebral body height maintained without acute or chronic fracture. Bone marrow signal intensity within normal limits. No worrisome osseous lesions. Mild degenerative reactive endplate changes present about the C5-6 and C6-7 interspaces. No other abnormal marrow edema.   Cord: Normal signal and morphology.   Posterior Fossa, vertebral arteries, paraspinal tissues: Unremarkable.   Disc levels:   C2-C3: Negative interspace. Right-sided facet degeneration with bony ankylosis. No spinal stenosis. Foramina remain patent.   C3-C4: Minimal disc bulge with right worse than left uncovertebral spurring. Moderate right-sided facet arthrosis. No spinal stenosis. Severe right with mild left C4 foraminal narrowing.   C4-C5: Mild disc bulge  with uncovertebral spurring. Moderate right facet hypertrophy. No spinal stenosis. Mild right C5 foraminal narrowing. Left neural foramen remains patent.   C5-C6: Degenerative intervertebral disc space narrowing with diffuse disc osteophyte complex, slightly asymmetric to the left. Flattening of the ventral thecal sac without significant spinal stenosis. Moderate  to severe left with moderate right C6 foraminal narrowing.   C6-C7: Degenerative disc space narrowing with diffuse disc osteophyte complex. Posterior component flattens and effaces the ventral thecal sac. Mild cord flattening without cord or changes. Mild spinal stenosis. Severe bilateral C7 foraminal narrowing.   C7-T1: Anterolisthesis. Mild disc bulge. Moderate left with mild right facet hypertrophy. No spinal stenosis. Mild left C8 foraminal narrowing. Right neural foramen remains patent.   T1-2: Mild disc bulge with moderate left-sided facet arthrosis. No spinal stenosis. Moderate left foraminal narrowing.   T2-3: Diffuse disc bulge with reactive endplate spurring. Superimposed central disc protrusion with slight superior migration and annular fissure. Bilateral facet hypertrophy. No significant spinal stenosis. Mild left with moderate right foraminal narrowing.   IMPRESSION: 1. Multilevel cervical spondylosis with resultant mild spinal stenosis at C6-7. 2. Multifactorial degenerative changes with resultant multilevel foraminal narrowing as above. Notable findings include severe right C4 foraminal stenosis, moderate to severe left with moderate right C6 foraminal narrowing, with severe bilateral C7 foraminal stenosis. 3. Moderate left C8 and T1 foraminal stenosis related to disc bulge and facet hypertrophy.     Electronically Signed   By: Virgia Griffins M.D.   On: 01/30/2024 23:21   Lumbar MRI dated 01/26/24:  FINDINGS:   BONES AND ALIGNMENT: Conventional lumbosacral anatomy with 5 non-ribbearing thoracic type vertebral bodies. Grade 1 anterolisthesis of L4 on L5. 4 mm retrolisthesis of L1 on L2.   SPINAL CORD: The conus terminates at T12-L1.   SOFT TISSUES: Moderate fatty atrophy of the paraspinal muscles.   L1-L2: Disc bulge and right greater than left facet arthropathy results in mild spinal canal stenosis. No significant neural foraminal narrowing.    L2-L3: Left eccentric disc bulge abuts the exited nerve left L2 nerve root in the extraforaminal zone. Mild bilateral facet arthropathy.   L3-L4: Small disc bulge and right greater than left facet arthropathy contribute to moderate right neural foraminal narrowing.   L4-L5: Anterolisthesis with uncovered disc and severe bilateral facet arthropathy with right-sided joint effusion result in moderate spinal canal stenosis and severe left neural foraminal narrowing.   L5-S1: Left foraminal disc protrusion and moderate bilateral facet arthropathy contribute to moderate left neural foraminal narrowing. No spinal canal stenosis.   IMPRESSION: 1. Multilevel lumbar spondylosis, worst at L4-5, where there is moderate spinal canal stenosis due to anterolisthesis, uncovered disc, and severe bilateral facet arthropathy with right-sided joint effusion. 2. Moderate-to-severe neural foraminal narrowing at multiple levels, as detailed above.   Electronically signed by: Audra Blend 02/01/2024 07:22 PM EDT RP Workstation: [id number]    I have personally reviewed the images and agree with the above interpretation.  Assessment and Plan: Mr. Schill has a history of chronic neck and back pain x years.    He has constant neck pain with radiation to his left shoulder. He has numbness, weakness, and tingling in his left arm.  No right arm pain.   He has known slip C3-C4, C4-C5, and C7-T1. He has mild central stenosis and severe bilateral foraminal stenosis C6-C7, moderate/severe left and moderate right foraminal stenosis C5-C6, and multilevel foraminal stenosis.   As above, he also has left shoulder pain that appears shoulder mediated. Ortho ordered MRI of  left shoulder.    He also has constant LBP with no leg pain. He has intermittent numbness/pain in right leg (posterior/medial thigh)- when he sleeps on right side. He notes some weakness in both legs.   He has known retrolisthesis L1-L2 with  mild stenosis, lumbar spondylosis with multilevel foraminal stenosis, and slip L4-L5 with moderate central and severe left foraminal stenosis.    Treatment options discussed with patient and following plan made:   - Xrays of cervical and lumbar spine with flex/ext.  - PT for cervical and lumbar spine. Orders to Renew PT.  - Discussed cervical/lumbar injections. He declines.  - He is interested in surgery options. He is aware he would need to do 6 weeks of PT or be discharged prior to surgical discussion.  - Follow up with me in 6-8 weeks for recheck.   Lucetta Russel PA-C Neurosurgery

## 2024-02-06 ENCOUNTER — Ambulatory Visit (INDEPENDENT_AMBULATORY_CARE_PROVIDER_SITE_OTHER): Admitting: Orthopedic Surgery

## 2024-02-06 ENCOUNTER — Encounter: Payer: Self-pay | Admitting: Orthopedic Surgery

## 2024-02-06 DIAGNOSIS — M47816 Spondylosis without myelopathy or radiculopathy, lumbar region: Secondary | ICD-10-CM

## 2024-02-06 DIAGNOSIS — M4802 Spinal stenosis, cervical region: Secondary | ICD-10-CM | POA: Diagnosis not present

## 2024-02-06 DIAGNOSIS — M4722 Other spondylosis with radiculopathy, cervical region: Secondary | ICD-10-CM | POA: Diagnosis not present

## 2024-02-06 DIAGNOSIS — M48061 Spinal stenosis, lumbar region without neurogenic claudication: Secondary | ICD-10-CM | POA: Diagnosis not present

## 2024-02-06 DIAGNOSIS — M25512 Pain in left shoulder: Secondary | ICD-10-CM | POA: Diagnosis not present

## 2024-02-06 DIAGNOSIS — M4312 Spondylolisthesis, cervical region: Secondary | ICD-10-CM

## 2024-02-06 DIAGNOSIS — M4316 Spondylolisthesis, lumbar region: Secondary | ICD-10-CM | POA: Diagnosis not present

## 2024-02-06 DIAGNOSIS — M47812 Spondylosis without myelopathy or radiculopathy, cervical region: Secondary | ICD-10-CM

## 2024-02-06 DIAGNOSIS — M5412 Radiculopathy, cervical region: Secondary | ICD-10-CM

## 2024-02-14 ENCOUNTER — Ambulatory Visit (HOSPITAL_BASED_OUTPATIENT_CLINIC_OR_DEPARTMENT_OTHER)
Admission: RE | Admit: 2024-02-14 | Discharge: 2024-02-14 | Disposition: A | Discharge status: Home / Self Care | Source: Ambulatory Visit | Attending: Orthopedic Surgery | Admitting: Orthopedic Surgery

## 2024-02-14 ENCOUNTER — Ambulatory Visit

## 2024-02-14 DIAGNOSIS — M47816 Spondylosis without myelopathy or radiculopathy, lumbar region: Secondary | ICD-10-CM | POA: Insufficient documentation

## 2024-02-14 DIAGNOSIS — M4316 Spondylolisthesis, lumbar region: Secondary | ICD-10-CM | POA: Insufficient documentation

## 2024-02-14 DIAGNOSIS — M4802 Spinal stenosis, cervical region: Secondary | ICD-10-CM | POA: Diagnosis not present

## 2024-02-14 DIAGNOSIS — M5412 Radiculopathy, cervical region: Secondary | ICD-10-CM | POA: Insufficient documentation

## 2024-02-14 DIAGNOSIS — M47812 Spondylosis without myelopathy or radiculopathy, cervical region: Secondary | ICD-10-CM | POA: Diagnosis not present

## 2024-02-14 DIAGNOSIS — M542 Cervicalgia: Secondary | ICD-10-CM | POA: Diagnosis not present

## 2024-02-14 DIAGNOSIS — M48061 Spinal stenosis, lumbar region without neurogenic claudication: Secondary | ICD-10-CM | POA: Diagnosis not present

## 2024-02-14 DIAGNOSIS — G8929 Other chronic pain: Secondary | ICD-10-CM | POA: Diagnosis not present

## 2024-02-14 DIAGNOSIS — M4312 Spondylolisthesis, cervical region: Secondary | ICD-10-CM | POA: Insufficient documentation

## 2024-02-14 DIAGNOSIS — M545 Low back pain, unspecified: Secondary | ICD-10-CM | POA: Diagnosis not present

## 2024-02-14 NOTE — Progress Notes (Signed)
 Patient presents today to pick up custom molded foot orthotics, diagnosed with Heel spur by Dr. Celia Coles.   Orthotics were dispensed and fit was satisfactory. Reviewed instructions for break-in and wear. Written instructions given to patient.  Patient will follow up as needed.   Britton Cane Cped, CFo, CFm

## 2024-02-15 ENCOUNTER — Telehealth: Payer: Self-pay | Admitting: *Deleted

## 2024-02-15 ENCOUNTER — Telehealth: Payer: Self-pay | Admitting: Orthopedic Surgery

## 2024-02-15 NOTE — Telephone Encounter (Signed)
 I called [facility]. They will have the patient come back.

## 2024-02-15 NOTE — Telephone Encounter (Signed)
 He had cervical and lumbar xrays done yesterday.   Flex/ext of both cervical and lumbar were ordered and they did obliques.   Please call radiology to see how/when he can get flex/ext of both cervical and lumbar spine done at no cost.   Let him know how to proceed.   Thanks!

## 2024-02-15 NOTE — Telephone Encounter (Signed)
 Returned patients call.

## 2024-02-16 DIAGNOSIS — M542 Cervicalgia: Secondary | ICD-10-CM | POA: Diagnosis not present

## 2024-02-19 ENCOUNTER — Telehealth: Payer: Self-pay | Admitting: *Deleted

## 2024-02-19 DIAGNOSIS — Z79899 Other long term (current) drug therapy: Secondary | ICD-10-CM

## 2024-02-19 DIAGNOSIS — E119 Type 2 diabetes mellitus without complications: Secondary | ICD-10-CM

## 2024-02-19 DIAGNOSIS — Z125 Encounter for screening for malignant neoplasm of prostate: Secondary | ICD-10-CM

## 2024-02-19 DIAGNOSIS — E782 Mixed hyperlipidemia: Secondary | ICD-10-CM

## 2024-02-19 NOTE — Telephone Encounter (Signed)
-----   Message from Gerry Krone sent at 02/19/2024  9:33 AM EDT ----- Regarding: Lab orders for Wed, 6.4.25 Patient is scheduled for CPX labs, please order future labs, Thanks , Anselmo Kings

## 2024-02-23 ENCOUNTER — Ambulatory Visit (HOSPITAL_BASED_OUTPATIENT_CLINIC_OR_DEPARTMENT_OTHER): Admitting: Orthopaedic Surgery

## 2024-02-23 DIAGNOSIS — G8929 Other chronic pain: Secondary | ICD-10-CM

## 2024-02-23 DIAGNOSIS — M25512 Pain in left shoulder: Secondary | ICD-10-CM

## 2024-02-23 DIAGNOSIS — M542 Cervicalgia: Secondary | ICD-10-CM | POA: Diagnosis not present

## 2024-02-23 NOTE — Progress Notes (Signed)
 Chief Complaint: Left shoulder pain     History of Present Illness:   02/23/2024: Today for follow-up of his left shoulder and MRI discussion  Bryan Kirby is a 68 y.o. male presents today with ongoing left shoulder pain and overhead weakness with range of motion.  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  He does have a history of a cervical disc herniation.  He does have some pain that radiates about the left aspect of the posterior shoulder and scapula.  He is right-hand dominant.  He has not had previous injections.  He has trialed anti-inflammatories without relief.  He has had 2 cervical injections in the past the first which gave him multiple years of relief.  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   Qualifier: Diagnosis of  By: Roxine Cordia MD, Candie Chamber    Sleep apnea    USES CPAP   Past Surgical History:  Procedure Laterality Date   CARPAL TUNNEL RELEASE Right    CYSTECTOMY     EYE SURGERY     KNEE ARTHROSCOPY     LEFT LAT MENISCUS TEAR   KNEE SURGERY     LEFT KNEE CAP WIRING AND PATELLAR REATTACHMENT   LEFT HEART CATHETERIZATION WITH CORONARY ANGIOGRAM N/A 10/02/2014   Procedure: LEFT HEART CATHETERIZATION WITH CORONARY ANGIOGRAM;  Surgeon: Luana Rumple, MD;  Location: [facility];  Service: Cardiovascular;  Laterality: N/A;   ORCHIECTOMY     MALDEVELOPEMENT AFTER  ORCHITIS   POLYPECTOMY     OF PENIS   Social History   Socioeconomic History   Marital status: Married    Spouse name: Not on file   Number of children: 1   Years of education: Not on file   Highest education level: Not on file  Occupational History   Occupation: PLUMBER  Tobacco Use   Smoking status: Former    Current packs/day: 0.00    Average packs/day: 0.5 packs/day for 1 year (0.5 ttl pk-yrs)    Types: Cigarettes    Start date: 10/03/1972    Quit date: 10/03/1973    Years since quitting: 50.4   Smokeless tobacco: Never  Vaping Use   Vaping status: Never Used  Substance and Sexual Activity   Alcohol use: No   Drug use: No   Sexual activity: Not on file  Other Topics Concern   Not on file  Social History Narrative   Not on file   Social Drivers of Health   Financial Resource Strain: Not on file  Food Insecurity: Not on file  Transportation Needs: Not on file  Physical Activity: Not on file  Stress: Not on file  Social Connections: Not on file   Family History  Problem Relation Age of Onset   Fibromyalgia Mother    Depression Mother  Cancer Father        PROSTATECTOMY   Stroke Father    Cancer Sister        CERVICAL   Stroke Paternal Grandfather    Hypertension Paternal Grandfather    Diabetes Paternal Grandfather    Colon polyps Neg Hx    Colon cancer Neg Hx    Esophageal cancer Neg Hx    Stomach cancer Neg Hx    Rectal cancer Neg Hx    Allergies  Allergen Reactions   Codeine     REACTION: itching   Penicillins Itching    REACTION: UNSPECIFIED   Current Outpatient Medications  Medication Sig Dispense Refill   aspirin  81 MG chewable tablet Chew 81 mg by mouth daily.     buPROPion  (WELLBUTRIN  XL) 150 MG 24 hr tablet Take 1 tablet (150 mg total) by mouth daily. 90 tablet 1   IRON CR PO Take by mouth.     Methylcobalamin (B12-ACTIVE PO) Take by mouth.     pantoprazole  (PROTONIX ) 40 MG tablet TAKE 1 TABLET BY MOUTH DAILY 90 tablet 3   simvastatin   (ZOCOR ) 40 MG tablet TAKE 1 TABLET BY MOUTH AT BEDTIME 90 tablet 3   No current facility-administered medications for this visit.   No results found.  Review of Systems:   A ROS was performed including pertinent positives and negatives as documented in the HPI.  Physical Exam :   Constitutional: NAD and appears stated age Neurological: Alert and oriented Psych: Appropriate affect and cooperative There were no vitals taken for this visit.   Comprehensive Musculoskeletal Exam:    Forward elevation is to 160 degrees without pain in the left shoulder.  He does have 4 out of 5 strength on the left compared to the right.  External rotation bilaterally is to 50 degrees with internal rotation bilaterally to L1, negative belly press.  Positive Neer impingement again with weakness   Imaging:   Xray (3 views left shoulder): Mild superior migration of the humerus without evidence of glenohumeral osteoarthritis   MRI left shoulder: Mild undersurface tearing of the rotator cuff certainly not full-thickness  I personally reviewed and interpreted the radiographs.   Assessment and Plan:   68 y.o. male right-hand-dominant male with left shoulder pain which I do believe is multifactorial.  He does have a known left-sided C6-C7 cervical disc herniation which I do believe may be exacerbating his symptoms.  MRI does reveal some mild rotator cuff pathology.  I would like him to get an opinion on surgery with Dr. Jeris Montes to discuss if any type of intervention is needed on this side as he has had previous significant relief from a cervical injection.  I did discuss that I would recommend surgical treatment of the neck prior to the shoulder as it is not uncommon to have radicular irritation with positioning from shoulder surgery  - Plan for referral to Dr. Jeris Montes for discussion of cervical treatment   I personally saw and evaluated the patient, and participated in the management and treatment  plan.  Wilhelmenia Harada, MD Attending Physician, Orthopedic Surgery  This document was dictated using Dragon voice recognition software. A reasonable attempt at proof reading has been made to minimize errors.

## 2024-02-27 ENCOUNTER — Telehealth: Payer: Self-pay | Admitting: Orthopedic Surgery

## 2024-02-27 NOTE — Telephone Encounter (Signed)
 He is scheduled to see me on 7/2. Dr. Mont Antis would like to see him the week before that (do NP slot and put per CKY) or when he back from vacation.   Please cancel visit with me and schedule with Mont Antis as above.   Thanks!

## 2024-02-29 ENCOUNTER — Ambulatory Visit (INDEPENDENT_AMBULATORY_CARE_PROVIDER_SITE_OTHER): Admitting: Family Medicine

## 2024-02-29 ENCOUNTER — Encounter: Payer: Self-pay | Admitting: Family Medicine

## 2024-02-29 VITALS — BP 110/62 | HR 96 | Temp 98.2°F | Ht 71.0 in | Wt 276.0 lb

## 2024-02-29 DIAGNOSIS — R350 Frequency of micturition: Secondary | ICD-10-CM | POA: Diagnosis not present

## 2024-02-29 DIAGNOSIS — K439 Ventral hernia without obstruction or gangrene: Secondary | ICD-10-CM

## 2024-02-29 LAB — POCT URINE DIPSTICK
Blood, UA: NEGATIVE
Glucose, UA: NEGATIVE mg/dL
Leukocytes, UA: NEGATIVE
Nitrite, UA: NEGATIVE
Spec Grav, UA: 1.015 (ref 1.010–1.025)
Urobilinogen, UA: 0.2 U/dL
pH, UA: 6 (ref 5.0–8.0)

## 2024-02-29 MED ORDER — CIPROFLOXACIN HCL 500 MG PO TABS: 500.0000 mg | ORAL_TABLET | Freq: Two times a day (BID) | ORAL | 0 refills | Status: DC

## 2024-02-29 MED ORDER — TAMSULOSIN HCL 0.4 MG PO CAPS
0.4000 mg | ORAL_CAPSULE | Freq: Every day | ORAL | 3 refills | Status: DC
Start: 2024-02-29 — End: 2024-03-12

## 2024-02-29 NOTE — Patient Instructions (Signed)
 You could see general surgery at any point for the hernia

## 2024-02-29 NOTE — Progress Notes (Addendum)
 Bryan Joerger T. Mylo Driskill, MD, CAQ Sports Medicine [facility] [address] [place], [address]  Phone: [phone]  FAX: [phone]  Bryan Kirby - 68 y.o. male  MRN [id number]  Date of Birth: 1956/08/04  Date: 02/29/2024  PCP: Scherrie Curt, MD  Referral: Scherrie Curt, MD  Chief Complaint  Patient presents with   Urinary Frequency   Subjective:   Bryan Kirby is a 68 y.o. very pleasant male patient with Body mass index is 38.49 kg/m. who presents with the following:  Patient presents with some changes to his urinary frequency.  At baseline prior to a few months ago, he rarely had to get up in the middle the night to go to the bathroom.  Over the last 2 months or so, at this point he will stop drinking at roughly 7 PM, and he will often get up up to 3 times a night.  Up to two or three months ago, rarely got up at night.  Rarely would get up.  Now can stop at 7 and will get three times in the night Sporadic  Building a shed and felt like something strained really bad He now thinks he has a small bulge in the hypogastric region.  He is uncomfortable and uncomfortable when he is active and lifting. No blood urine No sex in a long time  No pain  Stopped Lexapro  on his own - feels like losing temper has gotten better  Hernia - hypogastric bulge from 2-3 months ago    Review of Systems is noted in the HPI, as appropriate  Objective:   BP 110/62   Pulse 96   Temp 98.2 F (36.8 C) (Temporal)   Ht 5' 11 (1.803 m)   Wt 276 lb (125.2 kg)   SpO2 97%   BMI 38.49 kg/m   GEN: No acute distress; alert,appropriate. PULM: Breathing comfortably in no respiratory distress PSYCH: Normally interactive.  GU: Normal male.  No tenderness or mass felt at the testicle. Rectal: Patient does have an enlarged prostate is mildly tender ABD: S, NT, ND, + BS, No rebound, No HSM.  I do think that there is a hypogastric bulging that  is present inferior to the umbilicus.  I do not appreciate an inguinal hernia.  Laboratory and Imaging Data:  Assessment and Plan:     ICD-10-CM   1. Urinary frequency  R35.0 POCT URINE DIPSTICK    Ambulatory referral to Urology    2. Ventral hernia without obstruction or gangrene  K43.9      Urinary frequency and discomfort of unclear origin.  He does have some tenderness at the prostate and there is some vagueness to the area of discomfort.  No signs of UTI.  I am going to treat the patient for acute prostatitis and see if this relieves his symptoms.  If this does not help with his symptoms, then recommended he start some Flomax  after the course of antibiotics.  I think he does have a small hernia that is painful.  He is going to think about it, but at any point we could consult general surgery.  Addendum: 03/15/24 11:20 AM  Patient has persistent symptoms despite using Flomax , Detrol  LA, and antibiotics.  Consult urology.  Medication Management during today's office visit: Meds ordered this encounter  Medications   ciprofloxacin  (CIPRO ) 500 MG tablet    Sig: Take 1 tablet (500 mg total) by mouth 2 (two) times daily.  Dispense:  28 tablet    Refill:  0   DISCONTD: tamsulosin  (FLOMAX ) 0.4 MG CAPS capsule    Sig: Take 1 capsule (0.4 mg total) by mouth daily.    Dispense:  30 capsule    Refill:  3    Do not fill until the patients asks for it   There are no discontinued medications.  Orders placed today for conditions managed today: Orders Placed This Encounter  Procedures   Ambulatory referral to Urology   POCT URINE DIPSTICK    Disposition: No follow-ups on file.  Dragon [employer] One speech-to-text software was used for transcription in this dictation.  Possible transcriptional errors can occur using Animal nutritionist.   Signed,  Ranny Bye. Lisel Siegrist, MD   Outpatient Encounter Medications as of 02/29/2024  Medication Sig   aspirin  81 MG chewable tablet Chew 81 mg  by mouth daily.   buPROPion  (WELLBUTRIN  XL) 150 MG 24 hr tablet Take 1 tablet (150 mg total) by mouth daily.   ciprofloxacin  (CIPRO ) 500 MG tablet Take 1 tablet (500 mg total) by mouth 2 (two) times daily.   IRON CR PO Take 1 capsule by mouth daily.   Methylcobalamin (B12-ACTIVE PO) Take 1 tablet by mouth daily.   pantoprazole  (PROTONIX ) 40 MG tablet TAKE 1 TABLET BY MOUTH DAILY   simvastatin  (ZOCOR ) 40 MG tablet TAKE 1 TABLET BY MOUTH AT BEDTIME   [DISCONTINUED] tamsulosin  (FLOMAX ) 0.4 MG CAPS capsule Take 1 capsule (0.4 mg total) by mouth daily.   No facility-administered encounter medications on file as of 02/29/2024.

## 2024-03-06 ENCOUNTER — Other Ambulatory Visit: Payer: Self-pay

## 2024-03-12 ENCOUNTER — Telehealth: Payer: Self-pay | Admitting: *Deleted

## 2024-03-12 MED ORDER — TOLTERODINE TARTRATE ER 4 MG PO CP24: 4.0000 mg | ORAL_CAPSULE | Freq: Every day | ORAL | 2 refills | Status: DC

## 2024-03-12 NOTE — Telephone Encounter (Signed)
 Copied from CRM [id number]. Topic: Clinical - Medical Advice >> Mar 12, 2024 10:45 AM Albertha Alosa wrote: Reason for CRM: Patient called in regarding previous appointment with Dr.Copeland stated the prescriptions that was prescribed has not been working , and that he will like a cal on what he needs to do next  ciprofloxacin  (CIPRO ) 500 MG tablet tamsulosin  (FLOMAX ) 0.4 MG CAPS capsule

## 2024-03-12 NOTE — Telephone Encounter (Signed)
 Mr. Heal notified as instructed by telephone.  Patient states understanding.  Medication list updated.

## 2024-03-12 NOTE — Telephone Encounter (Signed)
 Spoke with [employer].  He states he started the Flomax  one week after starting the Ciprofloxin.  He has 3 days left on the antibiotics.  He states he actually started taking 2 Flomax  on Sunday and Monday this week and states there really hasn't been any change.  He states the mornings are okay but as the day progressives he symptoms get worse.  He still it getting up 2 to 3 times at night.  Please advise.

## 2024-03-12 NOTE — Telephone Encounter (Signed)
 I need clarification -I saw him about 10 days ago.  I gave him some antibiotics to treat for possible prostate infection.  My original plan was for him to try Flomax  after he completed his 14 days of antibiotics.  I would try the Flomax  for at least a week before I stopped it to see if he has a response to this for an enlarged prostate.  I would definitely want to do this before changing years and addressing a different type of problem.

## 2024-03-12 NOTE — Telephone Encounter (Signed)
 Okay.  Go ahead and stop the Flomax .  I sent him in some Detrol LA that he can try.  If he does not have any relief of symptoms in 7 to 10 days, that we can always have urology see him.

## 2024-03-13 ENCOUNTER — Encounter: Payer: Self-pay | Admitting: Family Medicine

## 2024-03-15 NOTE — Addendum Note (Signed)
 Addended by: Scherrie Curt on: 03/15/2024 11:20 AM   Modules accepted: Orders

## 2024-03-15 NOTE — Telephone Encounter (Signed)
 I placed a urology consultation, and they will contact him directly.

## 2024-03-15 NOTE — Telephone Encounter (Signed)
 Bryan Kirby notified as instructed by telephone.  He will await call from referrals.

## 2024-03-15 NOTE — Telephone Encounter (Unsigned)
 Copied from CRM [id number]. Topic: Referral - Question >> Mar 15, 2024  9:58 AM [place] wrote: Reason for CRM: Was seen a few weeks ago for swollen prostate, the new medication he was given to help is not working, making it worse. Wanting to know if he can get a referral to a Urologist as soon as possible.

## 2024-03-28 ENCOUNTER — Ambulatory Visit: Admitting: Neurosurgery

## 2024-04-03 ENCOUNTER — Other Ambulatory Visit (INDEPENDENT_AMBULATORY_CARE_PROVIDER_SITE_OTHER)

## 2024-04-03 ENCOUNTER — Ambulatory Visit: Admitting: Orthopedic Surgery

## 2024-04-03 DIAGNOSIS — Z125 Encounter for screening for malignant neoplasm of prostate: Secondary | ICD-10-CM

## 2024-04-03 DIAGNOSIS — Z79899 Other long term (current) drug therapy: Secondary | ICD-10-CM

## 2024-04-03 DIAGNOSIS — E782 Mixed hyperlipidemia: Secondary | ICD-10-CM

## 2024-04-03 DIAGNOSIS — E119 Type 2 diabetes mellitus without complications: Secondary | ICD-10-CM | POA: Diagnosis not present

## 2024-04-03 LAB — HEPATIC FUNCTION PANEL
ALT: 16 U/L (ref 0–53)
AST: 21 U/L (ref 0–37)
Albumin: 4.3 g/dL (ref 3.5–5.2)
Alkaline Phosphatase: 49 U/L (ref 39–117)
Bilirubin, Direct: 0.1 mg/dL (ref 0.0–0.3)
Total Bilirubin: 0.5 mg/dL (ref 0.2–1.2)
Total Protein: 6.6 g/dL (ref 6.0–8.3)

## 2024-04-03 LAB — BASIC METABOLIC PANEL WITH GFR
BUN: 11 mg/dL (ref 6–23)
CO2: 28 meq/L (ref 19–32)
Calcium: 9.6 mg/dL (ref 8.4–10.5)
Chloride: 103 meq/L (ref 96–112)
Creatinine, Ser: 1.1 mg/dL (ref 0.40–1.50)
GFR: 70.35 mL/min (ref >60.00)
Glucose, Bld: 114 mg/dL — ABNORMAL HIGH (ref 70–99)
Potassium: 4.6 meq/L (ref 3.5–5.1)
Sodium: 138 meq/L (ref 135–145)

## 2024-04-03 LAB — CBC WITH DIFFERENTIAL/PLATELET
Basophils Absolute: 0 10*3/uL (ref 0.0–0.1)
Basophils Relative: 0.6 % (ref 0.0–3.0)
Eosinophils Absolute: 0.2 10*3/uL (ref 0.0–0.7)
Eosinophils Relative: 2.6 % (ref 0.0–5.0)
HCT: 40.5 % (ref 39.0–52.0)
Hemoglobin: 12.8 g/dL — ABNORMAL LOW (ref 13.0–17.0)
Lymphocytes Relative: 22.6 % (ref 12.0–46.0)
Lymphs Abs: 1.4 10*3/uL (ref 0.7–4.0)
MCHC: 31.6 g/dL (ref 30.0–36.0)
MCV: 81.7 fl (ref 78.0–100.0)
Monocytes Absolute: 0.8 10*3/uL (ref 0.1–1.0)
Monocytes Relative: 12.9 % — ABNORMAL HIGH (ref 3.0–12.0)
Neutro Abs: 3.7 10*3/uL (ref 1.4–7.7)
Neutrophils Relative %: 61.3 % (ref 43.0–77.0)
Platelets: 309 10*3/uL (ref 150.0–400.0)
RBC: 4.96 Mil/uL (ref 4.22–5.81)
RDW: 16.1 % — ABNORMAL HIGH (ref 11.5–15.5)
WBC: 6 10*3/uL (ref 4.0–10.5)

## 2024-04-03 LAB — MICROALBUMIN / CREATININE URINE RATIO
Creatinine,U: 142 mg/dL
Microalb Creat Ratio: UNDETERMINED mg/g (ref 0.0–30.0)
Microalb, Ur: <0.7 mg/dL

## 2024-04-03 LAB — LIPID PANEL
Cholesterol: 133 mg/dL (ref 0–200)
HDL: 46.3 mg/dL (ref >39.00)
LDL Cholesterol: 66 mg/dL (ref 0–99)
NonHDL: 86.84
Total CHOL/HDL Ratio: 3
Triglycerides: 103 mg/dL (ref 0.0–149.0)
VLDL: 20.6 mg/dL (ref 0.0–40.0)

## 2024-04-03 LAB — HEMOGLOBIN A1C: Hgb A1c MFr Bld: 6.8 % — ABNORMAL HIGH (ref 4.6–6.5)

## 2024-04-08 LAB — PSA, TOTAL WITH REFLEX TO PSA, FREE: PSA, Total: 0.8 ng/mL (ref <=4.0)

## 2024-04-09 ENCOUNTER — Encounter: Payer: Self-pay | Admitting: Family Medicine

## 2024-04-09 NOTE — Progress Notes (Unsigned)
 Bryan Devers T. Bryan Gilardi, MD, CAQ Sports Medicine [facility] [address] [place], [address]  Phone: [phone]  FAX: [phone]  BART ASHFORD - 68 y.o. male  MRN [id number]  Date of Birth: 1956-01-29  Date: 04/10/2024  PCP: Watt Mirza, MD  Referral: Watt Mirza, MD  No chief complaint on file.  Patient Care Team: Watt Mirza, MD as PCP - General (Family Medicine) Subjective:   Bryan Kirby is a 68 y.o. pleasant patient who presents for a welcome to Medicare examination:  Preventative Health Maintenance Visit:  Health Maintenance Summary Reviewed and updated, unless pt declines services.  Tobacco History Reviewed. Alcohol: No concerns, no excessive use Exercise Habits: Some activity, rec at least 30 mins 5 times a week STD concerns: no risk or activity to increase risk Drug Use: None  Medicare wellness exam COVID shot Foot exam Eye exam Prevnar 20 Shingrix Tdap  Diabetes Mellitus: Tolerating Medications: yes Compliance with diet: fair, There is no height or weight on file to calculate BMI. Exercise: minimal / intermittent Avg blood sugars at home: not checking Foot problems: none Hypoglycemia: none No nausea, vomitting, blurred vision, polyuria.  Lab Results  Component Value Date   HGBA1C 6.8 (H) 04/03/2024   HGBA1C 6.3 03/02/2023   HGBA1C 5.9 05/06/2022   Lab Results  Component Value Date   MICROALBUR <0.7 04/03/2024   LDLCALC 66 04/03/2024   CREATININE 1.10 04/03/2024    Wt Readings from Last 3 Encounters:  02/29/24 276 lb (125.2 kg)  01/18/24 279 lb (126.6 kg)  08/14/23 270 lb (122.5 kg)     Health Maintenance  Topic Date Due   Medicare Annual Wellness (AWV)  Never done   COVID-19 Vaccine (1) Never done   FOOT EXAM  Never done   OPHTHALMOLOGY EXAM  Never done   Pneumococcal Vaccine: 50+ Years (1 of 2 - PCV) Never done   Zoster Vaccines- Shingrix (1 of 2) Never done    DTaP/Tdap/Td (3 - Td or Tdap) 04/09/2023   INFLUENZA VACCINE  05/03/2024   HEMOGLOBIN A1C  10/04/2024   Diabetic kidney evaluation - eGFR measurement  04/03/2025   Diabetic kidney evaluation - Urine ACR  04/03/2025   Colonoscopy  08/13/2028   Hepatitis C Screening  Completed   HIV Screening  Completed   Hepatitis B Vaccines  Aged Out   HPV VACCINES  Aged Out   Meningococcal B Vaccine  Aged Out    Immunization History  Administered Date(s) Administered   Influenza Split 08/22/2012   Influenza,inj,Quad PF,6+ Mos 08/27/2013, 07/01/2014, 09/16/2015, 09/04/2017   Td 05/10/1999   Tdap 04/08/2013    Patient Active Problem List   Diagnosis Date Noted   Controlled type 2 diabetes mellitus without complication, without long-term current use of insulin  (HCC) 03/02/2022    Priority: High   Morbid obesity (HCC)     Priority: Medium    HLD (hyperlipidemia) 03/15/2007    Priority: Medium    Bilateral carpal tunnel syndrome 12/31/2021   OSA (obstructive sleep apnea) 12/20/2013   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/16/2007   Allergic rhinitis 03/15/2007   HERNIATED DISC  L1/2 R SIDE 03/15/2007    Past Medical History:  Diagnosis Date   Allergic rhinitis due to pollen    Anxiety    Clotting disorder (HCC)    Colon polyps    Controlled type 2 diabetes mellitus without complication, without long-term current use of insulin  (HCC) 03/02/2022  DVT (deep venous thrombosis) (HCC) 2000   Right leg   GERD (gastroesophageal reflux disease)    Hyperlipidemia    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/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   OSA on CPAP    USES CPAP    Past Surgical History:  Procedure Laterality Date   CARPAL TUNNEL RELEASE Right    CYSTECTOMY     EYE SURGERY     KNEE ARTHROSCOPY     LEFT LAT MENISCUS TEAR   KNEE SURGERY     LEFT KNEE CAP WIRING AND PATELLAR REATTACHMENT   LEFT HEART CATHETERIZATION WITH CORONARY ANGIOGRAM N/A 10/02/2014   Procedure: LEFT HEART CATHETERIZATION  WITH CORONARY ANGIOGRAM;  Surgeon: Jerel Balding, MD;  Location: [facility];  Service: Cardiovascular;  Laterality: N/A;   ORCHIECTOMY     MALDEVELOPEMENT AFTER ORCHITIS   POLYPECTOMY     OF PENIS    Family History  Problem Relation Age of Onset   Fibromyalgia Mother    Depression Mother    Cancer Father        PROSTATECTOMY   Stroke Father    Cancer Sister        CERVICAL   Stroke Paternal Grandfather    Hypertension Paternal Grandfather    Diabetes Paternal Grandfather    Colon polyps Neg Hx    Colon cancer Neg Hx    Esophageal cancer Neg Hx    Stomach cancer Neg Hx    Rectal cancer Neg Hx     Social History   Social History Narrative   Not on file    Past Medical History, Surgical History, Social History, Family History, Problem List, Medications, and Allergies have been reviewed and updated if relevant.  Review of Systems: Pertinent positives are listed above.  Otherwise, a full 14 point review of systems has been done in full and it is negative except where it is noted positive.  Objective:   There were no vitals taken for this visit.    07/01/2020    9:03 AM 07/13/2020   11:07 AM 01/23/2023    3:18 PM 03/13/2023    8:29 AM 02/29/2024    1:57 PM  Fall Risk  Falls in the past year?   0 0 0  Was there an injury with Fall?   0 0 0  Fall Risk Category Calculator   0 0 0  (RETIRED) Patient Fall Risk Level Moderate fall risk  Low fall risk      Patient at Risk for Falls Due to   No Fall Risks  No Fall Risks  Fall risk Follow up   Falls evaluation completed  Falls evaluation completed     Data saved with a previous flowsheet row definition   Ideal Body Weight:   No results found.    02/29/2024    1:57 PM 03/13/2023    8:30 AM 03/02/2022    8:24 AM 01/11/2021    8:41 AM 12/19/2018    8:26 AM  Depression screen PHQ 2/9  Decreased Interest 3 0 0 0 0  Down, Depressed, Hopeless 2 0 2 0 1  PHQ - 2 Score 5 0 2 0 1  Altered sleeping  1 1    Tired, decreased energy  3 3 3     Change in appetite 2 3 3     Feeling bad or failure about yourself  3 1 0    Trouble concentrating 2 0 0    Moving slowly or fidgety/restless 0 0 0    Suicidal  thoughts 0 0 0    PHQ-9 Score  8 9    Difficult doing work/chores Somewhat difficult Not difficult at all Somewhat difficult       GEN: well developed, well nourished, no acute distress Eyes: conjunctiva and lids normal, PERRLA, EOMI ENT: TM clear, nares clear, oral exam WNL Neck: supple, no lymphadenopathy, no thyromegaly, no JVD Pulm: clear to auscultation and percussion, respiratory effort normal CV: regular rate and rhythm, S1-S2, no murmur, rub or gallop, no bruits, peripheral pulses normal and symmetric, no cyanosis, clubbing, edema or varicosities GI: soft, non-tender; no hepatosplenomegaly, masses; active bowel sounds all quadrants GU: deferred Lymph: no cervical, axillary or inguinal adenopathy MSK: gait normal, muscle tone and strength WNL, no joint swelling, effusions, discoloration, crepitus  SKIN: clear, good turgor, color WNL, no rashes, lesions, or ulcerations Neuro: normal mental status, normal strength, sensation, and motion Psych: alert; oriented to person, place and time, normally interactive and not anxious or depressed in appearance.  All labs reviewed with patient.  Results for orders placed or performed in visit on 04/03/24  PSA, Total with Reflex to PSA, Free   Collection Time: 04/03/24  8:01 AM  Result Value Ref Range   PSA, Total 0.8 < OR = 4.0 ng/mL  Microalbumin / creatinine urine ratio   Collection Time: 04/03/24  8:01 AM  Result Value Ref Range   Microalb, Ur <0.7 mg/dL   Creatinine,U 857.9 mg/dL   Microalb Creat Ratio Unable to calculate 0.0 - 30.0 mg/g  Lipid panel   Collection Time: 04/03/24  8:01 AM  Result Value Ref Range   Cholesterol 133 0 - 200 mg/dL   Triglycerides 896.9 0.0 - 149.0 mg/dL   HDL 53.69 >60.99 mg/dL   VLDL 79.3 0.0 - 59.9 mg/dL   LDL Cholesterol 66 0 - 99  mg/dL   Total CHOL/HDL Ratio 3    NonHDL 86.84   Hemoglobin A1c   Collection Time: 04/03/24  8:01 AM  Result Value Ref Range   Hgb A1c MFr Bld 6.8 (H) 4.6 - 6.5 %  Hepatic function panel   Collection Time: 04/03/24  8:01 AM  Result Value Ref Range   Total Bilirubin 0.5 0.2 - 1.2 mg/dL   Bilirubin, Direct 0.1 0.0 - 0.3 mg/dL   Alkaline Phosphatase 49 39 - 117 U/L   AST 21 0 - 37 U/L   ALT 16 0 - 53 U/L   Total Protein 6.6 6.0 - 8.3 g/dL   Albumin 4.3 3.5 - 5.2 g/dL  CBC with Differential/Platelet   Collection Time: 04/03/24  8:01 AM  Result Value Ref Range   WBC 6.0 4.0 - 10.5 K/uL   RBC 4.96 4.22 - 5.81 Mil/uL   Hemoglobin 12.8 (L) 13.0 - 17.0 g/dL   HCT 59.4 60.9 - 47.9 %   MCV 81.7 78.0 - 100.0 fl   MCHC 31.6 30.0 - 36.0 g/dL   RDW 83.8 (H) 88.4 - 84.4 %   Platelets 309.0 150.0 - 400.0 K/uL   Neutrophils Relative % 61.3 43.0 - 77.0 %   Lymphocytes Relative 22.6 12.0 - 46.0 %   Monocytes Relative 12.9 (H) 3.0 - 12.0 %   Eosinophils Relative 2.6 0.0 - 5.0 %   Basophils Relative 0.6 0.0 - 3.0 %   Neutro Abs 3.7 1.4 - 7.7 K/uL   Lymphs Abs 1.4 0.7 - 4.0 K/uL   Monocytes Absolute 0.8 0.1 - 1.0 K/uL   Eosinophils Absolute 0.2 0.0 - 0.7 K/uL   Basophils  Absolute 0.0 0.0 - 0.1 K/uL  Basic metabolic panel   Collection Time: 04/03/24  8:01 AM  Result Value Ref Range   Sodium 138 135 - 145 mEq/L   Potassium 4.6 3.5 - 5.1 mEq/L   Chloride 103 96 - 112 mEq/L   CO2 28 19 - 32 mEq/L   Glucose, Bld 114 (H) 70 - 99 mg/dL   BUN 11 6 - 23 mg/dL   Creatinine, Ser 8.89 0.40 - 1.50 mg/dL   GFR 29.64 >39.99 mL/min   Calcium 9.6 8.4 - 10.5 mg/dL    Assessment and Plan:     ICD-10-CM   1. Healthcare maintenance  Z00.00       Health Maintenance Exam: The patient's preventative maintenance and recommended screening tests for an annual wellness exam were reviewed in full today. Brought up to date unless services declined.  Counselled on the importance of diet, exercise, and its  role in overall health and mortality. The patient's FH and SH was reviewed, including their home life, tobacco status, and drug and alcohol status.  Follow-up in 1 year for physical exam or additional follow-up below.  I have personally reviewed the Medicare Annual Wellness questionnaire and have noted 1. The patient's medical and social history 2. Their use of alcohol, tobacco or illicit drugs 3. Their current medications and supplements 4. The patient's functional ability including ADL's, fall risks, home safety risks and hearing or visual             impairment. 5. Diet and physical activities 6. Evidence for depression or mood disorders 7. Reviewed Updated provider list, see scanned forms and [facility] Snapshot.  8. Reviewed whether or not the patient has HCPOA or living will, and discussed what this means with the patient.  Recommended he bring in a copy for his chart in [facility].  The patients weight, height, BMI and visual acuity have been recorded in the chart I have made referrals, counseling and provided education to the patient based review of the above and I have provided the pt with a written personalized care plan for preventive services.  I have provided the patient with a copy of your personalized plan for preventive services. Instructed to take the time to review along with their updated medication list.  Disposition: No follow-ups on file.  Future Appointments  Date Time Provider Department Center  04/10/2024  9:20 AM Watt Mirza, MD [facility]  04/10/2024  2:45 PM Francisca Redell BROCKS, MD [facility] None  04/18/2024  9:00 AM Clois Fret, MD [facility] None    No orders of the defined types were placed in this encounter.  There are no discontinued medications. No orders of the defined types were placed in this encounter.   Signed,  Mirza DASEN. Anu Stagner, MD   Allergies as of 04/10/2024       Reactions   Codeine    REACTION: itching   Penicillins Itching   REACTION:  UNSPECIFIED        Medication List        Accurate as of April 09, 2024  3:21 PM. If you have any questions, ask your nurse or doctor.          aspirin  81 MG chewable tablet Chew 81 mg by mouth daily.   B12-ACTIVE PO Take 1 tablet by mouth daily.   buPROPion  150 MG 24 hr tablet Commonly known as: WELLBUTRIN  XL Take 1 tablet (150 mg total) by mouth daily.   ciprofloxacin  500 MG tablet Commonly known  as: Cipro  Take 1 tablet (500 mg total) by mouth 2 (two) times daily.   IRON CR PO Take 1 capsule by mouth daily.   pantoprazole  40 MG tablet Commonly known as: PROTONIX  TAKE 1 TABLET BY MOUTH DAILY   simvastatin  40 MG tablet Commonly known as: ZOCOR  TAKE 1 TABLET BY MOUTH AT BEDTIME   tolterodine  4 MG 24 hr capsule Commonly known as: Detrol  LA Take 1 capsule (4 mg total) by mouth daily.

## 2024-04-10 ENCOUNTER — Encounter: Payer: Self-pay | Admitting: Family Medicine

## 2024-04-10 ENCOUNTER — Ambulatory Visit (INDEPENDENT_AMBULATORY_CARE_PROVIDER_SITE_OTHER): Admitting: Family Medicine

## 2024-04-10 ENCOUNTER — Ambulatory Visit: Admitting: Urology

## 2024-04-10 VITALS — BP 120/66 | HR 77 | Temp 97.8°F | Ht 70.67 in | Wt 273.5 lb

## 2024-04-10 VITALS — BP 128/76 | HR 81 | Ht 70.5 in | Wt 273.0 lb

## 2024-04-10 DIAGNOSIS — R351 Nocturia: Secondary | ICD-10-CM

## 2024-04-10 DIAGNOSIS — Z0000 Encounter for general adult medical examination without abnormal findings: Secondary | ICD-10-CM | POA: Diagnosis not present

## 2024-04-10 DIAGNOSIS — R350 Frequency of micturition: Secondary | ICD-10-CM

## 2024-04-10 DIAGNOSIS — R1013 Epigastric pain: Secondary | ICD-10-CM | POA: Diagnosis not present

## 2024-04-10 LAB — BLADDER SCAN AMB NON-IMAGING

## 2024-04-10 NOTE — Patient Instructions (Addendum)
 Tetanus shot  Prevnar - 20

## 2024-04-10 NOTE — Progress Notes (Signed)
 04/10/24 2:45 PM   Glendia LITTIE Qua 1956-09-05 [id number]  CC: Urinary symptoms, nocturia  HPI: 68 year old male with a number of comorbidities including obesity with BMI of 39, type 2 diabetes, OSA on CPAP who presents with worsening urinary symptoms.  He thinks this started after he did some heavy lifting working on a large shed.  His primary urinary complaint is nocturia 2-3 times per night, weak stream, frequency.  He was trialed on Flomax  and Detrol  by PCP with minimal improvement.  He does drink a fair amount of artificially sweetened beverages.  Urinalysis with PCP benign.  PVR today normal at 7ml.  IPSS score 17, quality-of-life unhappy.  He is fairly adamant that this is from a hernia he suffered when doing the heavy lifting.  PSA normal at 0.8  PMH: Past Medical History:  Diagnosis Date   Allergic rhinitis due to pollen    Anxiety    Clotting disorder [facility])    Colon polyps    Controlled type 2 diabetes mellitus without complication, without long-term current use of insulin  (HCC) 03/02/2022   DVT (deep venous thrombosis) (HCC) 2000   Right leg   GERD (gastroesophageal reflux disease)    Hyperlipidemia    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/16/2007   OSA on CPAP    USES CPAP    Surgical History: Past Surgical History:  Procedure Laterality Date   CARPAL TUNNEL RELEASE Right    CYSTECTOMY     EYE SURGERY     KNEE ARTHROSCOPY     LEFT LAT MENISCUS TEAR   KNEE SURGERY     LEFT KNEE CAP WIRING AND PATELLAR REATTACHMENT   LEFT HEART CATHETERIZATION WITH CORONARY ANGIOGRAM N/A 10/02/2014   Procedure: LEFT HEART CATHETERIZATION WITH CORONARY ANGIOGRAM;  Surgeon: Jerel Balding, MD;  Location: [facility];  Service: Cardiovascular;  Laterality: N/A;   ORCHIECTOMY     MALDEVELOPEMENT AFTER ORCHITIS   POLYPECTOMY     OF PENIS     Family History: Family History  Problem Relation Age of Onset   Fibromyalgia Mother    Depression Mother    Cancer Father         PROSTATECTOMY   Stroke Father    Cancer Sister        CERVICAL   Stroke Paternal Grandfather    Hypertension Paternal Grandfather    Diabetes Paternal Grandfather    Colon polyps Neg Hx    Colon cancer Neg Hx    Esophageal cancer Neg Hx    Stomach cancer Neg Hx    Rectal cancer Neg Hx     Social History:  reports that he quit smoking about 50 years ago. His smoking use included cigarettes. He started smoking about 51 years ago. He has a 0.5 pack-year smoking history. He has never used smokeless tobacco. He reports that he does not drink alcohol and does not use drugs.  Physical Exam: BP 128/76 (BP Location: Left Arm, Patient Position: Sitting, Cuff Size: Large)   Pulse 81   Ht 5' 10.5 (1.791 m)   Wt 273 lb (123.8 kg)   SpO2 97%   BMI 38.62 kg/m    Constitutional:  Alert and oriented, No acute distress. Cardiovascular: No clubbing, cyanosis, or edema. Respiratory: Normal respiratory effort, no increased work of breathing. GI: Abdomen is soft, nontender, nondistended, no abdominal masses GU: Phallus with patent meatus, no lesions, solitary right testicle  Laboratory Data: Reviewed, see HPI  Assessment & Plan:   68 year old male with  urgency, weak stream, nocturia after doing some heavy lifting while building a shed.  Urinalysis and PVR normal.  He had no improvement with Flomax  and Detrol  from PCP.  He is fairly convinced this is from a hernia, and is interested in further imaging.  We discussed hernia very rarely would contribute to urinary symptoms, but can order CT for further evaluation.  I also recommended cystoscopy to evaluate for stricture or other abnormality.  Behavioral strategies discussed including avoiding bladder irritants, and minimizing fluid prior to bedtime.  Weight loss and improve diabetes control also discussed.  Follow-up for CT and cystoscopy for further evaluation of urinary symptoms  Redell Burnet, MD 04/10/2024  [facility] [address], [address] [place], [place] [address] [phone]

## 2024-04-10 NOTE — Patient Instructions (Addendum)
 Please Call Scheduling at [phone] to schedule an appointment for CT Scan.    Nocturia refers to the need to wake up during the night to urinate, which can disrupt your sleep and impact your overall well-being. Fortunately, there are several strategies you can employ to help prevent or manage nocturia. It's important to consult with your healthcare provider before making any significant changes to your routine. Here are some helpful strategies to consider:  Limit Fluid Intake Before Bed: Avoid drinking large amounts of fluids in the evening, especially within a few hours of bedtime. Consume most of your daily fluid intake earlier in the day to reduce the need to urinate at night.  Monitor Your Diet: Limit your intake of caffeine and alcohol, as these substances can increase urine production and irritate the bladder.  Avoid diet, zero calorie, and artificially sweetened drinks, especially sodas, in the afternoon or evening. Be mindful of consuming foods and drinks with high water content before bedtime, such as watermelon and herbal teas.  Time Your Medications: If you're taking medications that contribute to increased urination, consult your healthcare provider about adjusting the timing of these medications to minimize their impact during the night.  Practice Double Voiding: Before going to bed, make an effort to empty your bladder twice within a short period. This can help reduce the amount of urine left in your bladder before sleep.  Bladder Training: Gradually increase the time between bathroom visits during the day to train your bladder to hold larger volumes of urine. Over time, this can help reduce the frequency of nighttime awakenings to urinate.  Elevate Your Legs During the Day: Elevating your legs during the day can help minimize fluid retention in your lower extremities, which might reduce nighttime urination.  Pelvic Floor Exercises: Strengthening your pelvic floor  muscles through Kegel exercises can help improve bladder control and potentially reduce the urge to urinate at night.  Create a Relaxing Bedtime Routine: Stress and anxiety can exacerbate nocturia. Engage in calming activities before bed, such as reading, listening to soothing music, or practicing relaxation techniques.  Stay Active: Engage in regular physical activity, but avoid intense exercise close to bedtime, as this can increase your body's demand for fluids.  Maintain a Healthy Weight: Excess weight can compress the bladder and contribute to bladder and urinary issues. Aim to achieve and maintain a healthy weight through a balanced diet and regular exercise.  Remember that every individual is unique, and the effectiveness of these strategies may vary. It's important to work with your healthcare provider to develop a plan that suits your specific needs and addresses any underlying causes of nocturia.

## 2024-04-11 NOTE — Progress Notes (Signed)
 Referring Physician:  Watt Mirza, MD [address] [place],  [place] [address]  Primary Physician:  Watt Mirza, MD  History of Present Illness: 04/18/2024 [name] is here today with a chief complaint of neck and arm pain, as well as back and leg pain.  His neck pain bothers him more than his back.  He gets pain into his trapezius muscle and down his arm.  He has tried physical therapy but was discharged due to lack of improvement.  He presents today for consideration of intervention.  He is also having back pain with standing and walking.  He gets pain down his legs when he stands for too long.  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   History of Present Illness: 02/06/2024 note from [facility], PA-C Bryan Kirby is a 68 y.o. male has a history of OSA, DM, hyperlipidemia, obesity, depression.    History of chronic neck and back pain x years, has been worse in last 2 months. Lumbar surgery recommended years ago and he declined.    Last seen by me on 01/18/24 for constant neck pain with left arm pain along with constant LBP. MRI of cervical spine in 2014 showed cervical spondylosis with severe foraminal stenosis C6-C7. No lumbar imaging done.    He was referred to ortho for his left shoulder and MRI of cervical/lumbar spine were ordered.    He saw Dr. Genelle on 02/01/24 for left shoulder pain. MRI of left shoulder was ordered. He recommended possible cervical surgery prior to any shoulder surgery.    Phone visit scheduled to discuss his MRI results.    He is about the same.    He has constant neck pain with radiation to his left shoulder. He has numbness, weakness, and tingling in his left arm. Pain is worse with using his left arm. No alleviating factors. No right arm pain.    He also has constant LBP with no leg pain. Pain varies in severity. He has intermittent numbness/pain in right leg  (posterior/medial thigh)- when he sleeps on right side. He notes some weakness in both legs. Pain is worse with walking on an incline, bending, twisting.    He works part time as a Nutritional therapist.    He is taking Tylenol , Aleve with some relief.    He quit smoking in 1975.    Bowel/Bladder Dysfunction: none   Conservative measures:  Physical therapy:  has participated in PT with [facility] x 1 and then tried exercises at home Multimodal medical therapy including regular antiinflammatories: Aleve and Tylenol  Injections:  Cervical injections 15+ years ago with some relief No lumbar injections   Past Surgery: none   Bryan Kirby has no symptoms of cervical myelopathy.   The symptoms are causing a significant impact on the patient's life.         I have utilized the care everywhere function in epic to review the outside records available from external health systems.  Review of Systems:  A 10 point review of systems is negative, except for the pertinent positives and negatives detailed in the HPI.  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   OSA on CPAP    USES CPAP    Past Surgical History: Past Surgical History:  Procedure Laterality Date   CARPAL TUNNEL RELEASE Right    CYSTECTOMY     EYE SURGERY     KNEE ARTHROSCOPY     LEFT LAT MENISCUS TEAR   KNEE SURGERY     LEFT KNEE CAP WIRING AND PATELLAR REATTACHMENT   LEFT HEART CATHETERIZATION WITH CORONARY ANGIOGRAM N/A 10/02/2014   Procedure: LEFT HEART CATHETERIZATION WITH CORONARY ANGIOGRAM;  Surgeon: Jerel Balding, MD;  Location:  [facility];  Service: Cardiovascular;  Laterality: N/A;   ORCHIECTOMY     MALDEVELOPEMENT AFTER ORCHITIS   POLYPECTOMY     OF PENIS    Allergies: Allergies as of 04/18/2024 - Review Complete 04/18/2024  Allergen Reaction Noted   Codeine  03/15/2007   Penicillins Itching 03/15/2007    Medications:  Current Outpatient Medications:    aspirin  81 MG chewable tablet, Chew 81 mg by mouth daily., Disp: , Rfl:    buPROPion  (WELLBUTRIN  XL) 150 MG 24 hr tablet, Take 1 tablet (150 mg total) by mouth daily., Disp: 90 tablet, Rfl: 1   IRON CR PO, Take 1 capsule by mouth daily., Disp: , Rfl:    pantoprazole  (PROTONIX ) 40 MG tablet, TAKE 1 TABLET BY MOUTH DAILY, Disp: 90 tablet, Rfl: 3   simvastatin  (ZOCOR ) 40 MG tablet, TAKE 1 TABLET BY MOUTH AT BEDTIME, Disp: 90 tablet, Rfl: 3   tamsulosin  (FLOMAX ) 0.4 MG CAPS capsule, Take 0.4 mg by mouth., Disp: , Rfl:   Social History: Social History   Tobacco Use   Smoking status: Former    Current packs/day: 0.00    Average packs/day: 0.5 packs/day for 1 year (0.5 ttl pk-yrs)    Types: Cigarettes    Start date: 10/03/1972    Quit date: 10/03/1973    Years since quitting: 50.5   Smokeless tobacco: Never  Vaping Use   Vaping status: Never Used  Substance Use Topics   Alcohol use: No   Drug use: No    Family Medical History: Family History  Problem Relation Age of Onset   Fibromyalgia Mother    Depression Mother    Cancer Father        PROSTATECTOMY   Stroke Father    Cancer Sister        CERVICAL   Stroke Paternal Grandfather    Hypertension Paternal Grandfather    Diabetes Paternal Grandfather    Colon polyps Neg Hx    Colon cancer Neg Hx    Esophageal cancer Neg Hx    Stomach cancer Neg Hx    Rectal cancer Neg Hx     Physical Examination: Vitals:   04/18/24 0853  BP: 110/72    General: Patient is in no apparent distress. Attention to examination is appropriate.  Neck:   Supple.  Full range of motion.  Respiratory: Patient  is breathing without any difficulty.   NEUROLOGICAL:     Awake, alert, oriented to person, place, and time.  Speech is clear and fluent.   Cranial Nerves: Pupils equal round and reactive to light.  Facial tone is symmetric.  Facial sensation is symmetric. Shoulder shrug is symmetric. Tongue protrusion is midline.  There is no pronator drift.  Strength: Side Biceps Triceps Deltoid Interossei Grip  Wrist Ext. Wrist Flex.  R 5 5 5 5 5 5 5   L 5 5 5 5 5 5 5    Side Iliopsoas Quads Hamstring PF DF EHL  R 5 5 5 5 5 5   L 5 5 5 5 5 5    Reflexes are 1+ and symmetric at the biceps, triceps, brachioradialis, patella and achilles.   Hoffman's is absent.   Bilateral upper and lower extremity sensation is intact to light touch.    No evidence of dysmetria noted.  Gait is antalgic.     Medical Decision Making  Imaging: MRI CL spine 01/26/2024 Disc levels:   C2-C3: Negative interspace. Right-sided facet degeneration with bony ankylosis. No spinal stenosis. Foramina remain patent.   C3-C4: Minimal disc bulge with right worse than left uncovertebral spurring. Moderate right-sided facet arthrosis. No spinal stenosis. Severe right with mild left C4 foraminal narrowing.   C4-C5: Mild disc bulge with uncovertebral spurring. Moderate right facet hypertrophy. No spinal stenosis. Mild right C5 foraminal narrowing. Left neural foramen remains patent.   C5-C6: Degenerative intervertebral disc space narrowing with diffuse disc osteophyte complex, slightly asymmetric to the left. Flattening of the ventral thecal sac without significant spinal stenosis. Moderate to severe left with moderate right C6 foraminal narrowing.   C6-C7: Degenerative disc space narrowing with diffuse disc osteophyte complex. Posterior component flattens and effaces the ventral thecal sac. Mild cord flattening without cord or changes. Mild spinal stenosis. Severe bilateral C7 foraminal narrowing.   C7-T1: Anterolisthesis.  Mild disc bulge. Moderate left with mild right facet hypertrophy. No spinal stenosis. Mild left C8 foraminal narrowing. Right neural foramen remains patent.   T1-2: Mild disc bulge with moderate left-sided facet arthrosis. No spinal stenosis. Moderate left foraminal narrowing.   T2-3: Diffuse disc bulge with reactive endplate spurring. Superimposed central disc protrusion with slight superior migration and annular fissure. Bilateral facet hypertrophy. No significant spinal stenosis. Mild left with moderate right foraminal narrowing.  IMPRESSION: 1. Multilevel cervical spondylosis with resultant mild spinal stenosis at C6-7. 2. Multifactorial degenerative changes with resultant multilevel foraminal narrowing as above. Notable findings include severe right C4 foraminal stenosis, moderate to severe left with moderate right C6 foraminal narrowing, with severe bilateral C7 foraminal stenosis. 3. Moderate left C8 and T1 foraminal stenosis related to disc bulge and facet hypertrophy.     Electronically Signed   By: Morene Hoard M.D.   On: 01/30/2024 23:21  SOFT TISSUES: Moderate fatty atrophy of the paraspinal muscles.   L1-L2: Disc bulge and right greater than left facet arthropathy results in mild spinal canal stenosis. No significant neural foraminal narrowing.   L2-L3: Left eccentric disc bulge abuts the exited nerve left L2 nerve root in the extraforaminal zone. Mild bilateral facet arthropathy.   L3-L4: Small disc bulge and right greater than left facet arthropathy contribute to moderate right neural foraminal narrowing.   L4-L5: Anterolisthesis with uncovered disc and severe bilateral facet arthropathy with right-sided joint effusion result in moderate spinal canal stenosis and severe left neural foraminal narrowing.   L5-S1: Left foraminal disc protrusion and moderate bilateral facet arthropathy contribute to moderate left neural foraminal narrowing. No  spinal canal stenosis.   IMPRESSION: 1. Multilevel lumbar spondylosis, worst at L4-5, where there is moderate spinal canal stenosis due to anterolisthesis, uncovered disc, and severe bilateral facet arthropathy with right-sided joint effusion. 2. Moderate-to-severe neural foraminal narrowing at multiple levels, as detailed above.   Electronically signed by: ryan chess 02/01/2024 07:22 PM EDT RP Workstation: [id number]  I have personally reviewed the images and agree with the above interpretation.  Assessment and Plan: Mr. Boodram is a pleasant 68 y.o. male with left-sided cervical radiculopathy affecting the C6 and C7 nerve roots.  He gets numbness in his left hand as well at times.  He may have some carpal tunnel syndrome, but he clearly has symptoms of cervical radiculopathy.  Additionally, he has spondylolisthesis at L4-5 with worsening anterolisthesis when comparing his prior CT scans.  I suspect he will ultimately need intervention there.  He has failed conservative management for his neck.  I recommended C5-7 anterior cervical discectomy and fusion.  I discussed the planned procedure at length with the patient, including the risks, benefits, alternatives, and indications. The risks discussed include but are not limited to bleeding, infection, need for reoperation, spinal fluid leak, stroke, vision loss, anesthetic complication, coma, paralysis, and even death. We also discussed the possibility of post-operative dysphagia, vocal cord paralysis, and the risk of adjacent segment disease in the future. I also described in detail that improvement was not guaranteed.  The patient expressed understanding of these risks, and asked that we proceed with surgery. I described the surgery in layman's terms, and gave ample opportunity for questions, which were answered to the best of my ability.  I spent a total of 30 minutes in this patient's care today. This time was spent reviewing  pertinent records including imaging studies, obtaining and confirming history, performing a directed evaluation, formulating and discussing my recommendations, and documenting the visit within the medical record.     Thank you for involving me in the care of this patient.      Saleen Peden K. Clois MD, [facility]

## 2024-04-11 NOTE — H&P (View-Only) (Signed)
 Referring Physician:  Watt Mirza, MD [address] [place],  [place] [address]  Primary Physician:  Watt Mirza, MD  History of Present Illness: 04/18/2024 [name] is here today with a chief complaint of neck and arm pain, as well as back and leg pain.  His neck pain bothers him more than his back.  He gets pain into his trapezius muscle and down his arm.  He has tried physical therapy but was discharged due to lack of improvement.  He presents today for consideration of intervention.  He is also having back pain with standing and walking.  He gets pain down his legs when he stands for too long.  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   History of Present Illness: 02/06/2024 note from [facility], PA-C JONUEL BUTTERFIELD is a 68 y.o. male has a history of OSA, DM, hyperlipidemia, obesity, depression.    History of chronic neck and back pain x years, has been worse in last 2 months. Lumbar surgery recommended years ago and he declined.    Last seen by me on 01/18/24 for constant neck pain with left arm pain along with constant LBP. MRI of cervical spine in 2014 showed cervical spondylosis with severe foraminal stenosis C6-C7. No lumbar imaging done.    He was referred to ortho for his left shoulder and MRI of cervical/lumbar spine were ordered.    He saw Dr. Genelle on 02/01/24 for left shoulder pain. MRI of left shoulder was ordered. He recommended possible cervical surgery prior to any shoulder surgery.    Phone visit scheduled to discuss his MRI results.    He is about the same.    He has constant neck pain with radiation to his left shoulder. He has numbness, weakness, and tingling in his left arm. Pain is worse with using his left arm. No alleviating factors. No right arm pain.    He also has constant LBP with no leg pain. Pain varies in severity. He has intermittent numbness/pain in right leg  (posterior/medial thigh)- when he sleeps on right side. He notes some weakness in both legs. Pain is worse with walking on an incline, bending, twisting.    He works part time as a Nutritional therapist.    He is taking Tylenol , Aleve with some relief.    He quit smoking in 1975.    Bowel/Bladder Dysfunction: none   Conservative measures:  Physical therapy:  has participated in PT with [facility] x 1 and then tried exercises at home Multimodal medical therapy including regular antiinflammatories: Aleve and Tylenol  Injections:  Cervical injections 15+ years ago with some relief No lumbar injections   Past Surgery: none   Bryan Kirby has no symptoms of cervical myelopathy.   The symptoms are causing a significant impact on the patient's life.         I have utilized the care everywhere function in epic to review the outside records available from external health systems.  Review of Systems:  A 10 point review of systems is negative, except for the pertinent positives and negatives detailed in the HPI.  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   OSA on CPAP    USES CPAP    Past Surgical History: Past Surgical History:  Procedure Laterality Date   CARPAL TUNNEL RELEASE Right    CYSTECTOMY     EYE SURGERY     KNEE ARTHROSCOPY     LEFT LAT MENISCUS TEAR   KNEE SURGERY     LEFT KNEE CAP WIRING AND PATELLAR REATTACHMENT   LEFT HEART CATHETERIZATION WITH CORONARY ANGIOGRAM N/A 10/02/2014   Procedure: LEFT HEART CATHETERIZATION WITH CORONARY ANGIOGRAM;  Surgeon: Jerel Balding, MD;  Location:  [facility];  Service: Cardiovascular;  Laterality: N/A;   ORCHIECTOMY     MALDEVELOPEMENT AFTER ORCHITIS   POLYPECTOMY     OF PENIS    Allergies: Allergies as of 04/18/2024 - Review Complete 04/18/2024  Allergen Reaction Noted   Codeine  03/15/2007   Penicillins Itching 03/15/2007    Medications:  Current Outpatient Medications:    aspirin  81 MG chewable tablet, Chew 81 mg by mouth daily., Disp: , Rfl:    buPROPion  (WELLBUTRIN  XL) 150 MG 24 hr tablet, Take 1 tablet (150 mg total) by mouth daily., Disp: 90 tablet, Rfl: 1   IRON CR PO, Take 1 capsule by mouth daily., Disp: , Rfl:    pantoprazole  (PROTONIX ) 40 MG tablet, TAKE 1 TABLET BY MOUTH DAILY, Disp: 90 tablet, Rfl: 3   simvastatin  (ZOCOR ) 40 MG tablet, TAKE 1 TABLET BY MOUTH AT BEDTIME, Disp: 90 tablet, Rfl: 3   tamsulosin  (FLOMAX ) 0.4 MG CAPS capsule, Take 0.4 mg by mouth., Disp: , Rfl:   Social History: Social History   Tobacco Use   Smoking status: Former    Current packs/day: 0.00    Average packs/day: 0.5 packs/day for 1 year (0.5 ttl pk-yrs)    Types: Cigarettes    Start date: 10/03/1972    Quit date: 10/03/1973    Years since quitting: 50.5   Smokeless tobacco: Never  Vaping Use   Vaping status: Never Used  Substance Use Topics   Alcohol use: No   Drug use: No    Family Medical History: Family History  Problem Relation Age of Onset   Fibromyalgia Mother    Depression Mother    Cancer Father        PROSTATECTOMY   Stroke Father    Cancer Sister        CERVICAL   Stroke Paternal Grandfather    Hypertension Paternal Grandfather    Diabetes Paternal Grandfather    Colon polyps Neg Hx    Colon cancer Neg Hx    Esophageal cancer Neg Hx    Stomach cancer Neg Hx    Rectal cancer Neg Hx     Physical Examination: Vitals:   04/18/24 0853  BP: 110/72    General: Patient is in no apparent distress. Attention to examination is appropriate.  Neck:   Supple.  Full range of motion.  Respiratory: Patient  is breathing without any difficulty.   NEUROLOGICAL:     Awake, alert, oriented to person, place, and time.  Speech is clear and fluent.   Cranial Nerves: Pupils equal round and reactive to light.  Facial tone is symmetric.  Facial sensation is symmetric. Shoulder shrug is symmetric. Tongue protrusion is midline.  There is no pronator drift.  Strength: Side Biceps Triceps Deltoid Interossei Grip  Wrist Ext. Wrist Flex.  R 5 5 5 5 5 5 5   L 5 5 5 5 5 5 5    Side Iliopsoas Quads Hamstring PF DF EHL  R 5 5 5 5 5 5   L 5 5 5 5 5 5    Reflexes are 1+ and symmetric at the biceps, triceps, brachioradialis, patella and achilles.   Hoffman's is absent.   Bilateral upper and lower extremity sensation is intact to light touch.    No evidence of dysmetria noted.  Gait is antalgic.     Medical Decision Making  Imaging: MRI CL spine 01/26/2024 Disc levels:   C2-C3: Negative interspace. Right-sided facet degeneration with bony ankylosis. No spinal stenosis. Foramina remain patent.   C3-C4: Minimal disc bulge with right worse than left uncovertebral spurring. Moderate right-sided facet arthrosis. No spinal stenosis. Severe right with mild left C4 foraminal narrowing.   C4-C5: Mild disc bulge with uncovertebral spurring. Moderate right facet hypertrophy. No spinal stenosis. Mild right C5 foraminal narrowing. Left neural foramen remains patent.   C5-C6: Degenerative intervertebral disc space narrowing with diffuse disc osteophyte complex, slightly asymmetric to the left. Flattening of the ventral thecal sac without significant spinal stenosis. Moderate to severe left with moderate right C6 foraminal narrowing.   C6-C7: Degenerative disc space narrowing with diffuse disc osteophyte complex. Posterior component flattens and effaces the ventral thecal sac. Mild cord flattening without cord or changes. Mild spinal stenosis. Severe bilateral C7 foraminal narrowing.   C7-T1: Anterolisthesis.  Mild disc bulge. Moderate left with mild right facet hypertrophy. No spinal stenosis. Mild left C8 foraminal narrowing. Right neural foramen remains patent.   T1-2: Mild disc bulge with moderate left-sided facet arthrosis. No spinal stenosis. Moderate left foraminal narrowing.   T2-3: Diffuse disc bulge with reactive endplate spurring. Superimposed central disc protrusion with slight superior migration and annular fissure. Bilateral facet hypertrophy. No significant spinal stenosis. Mild left with moderate right foraminal narrowing.  IMPRESSION: 1. Multilevel cervical spondylosis with resultant mild spinal stenosis at C6-7. 2. Multifactorial degenerative changes with resultant multilevel foraminal narrowing as above. Notable findings include severe right C4 foraminal stenosis, moderate to severe left with moderate right C6 foraminal narrowing, with severe bilateral C7 foraminal stenosis. 3. Moderate left C8 and T1 foraminal stenosis related to disc bulge and facet hypertrophy.     Electronically Signed   By: Morene Hoard M.D.   On: 01/30/2024 23:21  SOFT TISSUES: Moderate fatty atrophy of the paraspinal muscles.   L1-L2: Disc bulge and right greater than left facet arthropathy results in mild spinal canal stenosis. No significant neural foraminal narrowing.   L2-L3: Left eccentric disc bulge abuts the exited nerve left L2 nerve root in the extraforaminal zone. Mild bilateral facet arthropathy.   L3-L4: Small disc bulge and right greater than left facet arthropathy contribute to moderate right neural foraminal narrowing.   L4-L5: Anterolisthesis with uncovered disc and severe bilateral facet arthropathy with right-sided joint effusion result in moderate spinal canal stenosis and severe left neural foraminal narrowing.   L5-S1: Left foraminal disc protrusion and moderate bilateral facet arthropathy contribute to moderate left neural foraminal narrowing. No  spinal canal stenosis.   IMPRESSION: 1. Multilevel lumbar spondylosis, worst at L4-5, where there is moderate spinal canal stenosis due to anterolisthesis, uncovered disc, and severe bilateral facet arthropathy with right-sided joint effusion. 2. Moderate-to-severe neural foraminal narrowing at multiple levels, as detailed above.   Electronically signed by: ryan chess 02/01/2024 07:22 PM EDT RP Workstation: [id number]  I have personally reviewed the images and agree with the above interpretation.  Assessment and Plan: Mr. Boodram is a pleasant 68 y.o. male with left-sided cervical radiculopathy affecting the C6 and C7 nerve roots.  He gets numbness in his left hand as well at times.  He may have some carpal tunnel syndrome, but he clearly has symptoms of cervical radiculopathy.  Additionally, he has spondylolisthesis at L4-5 with worsening anterolisthesis when comparing his prior CT scans.  I suspect he will ultimately need intervention there.  He has failed conservative management for his neck.  I recommended C5-7 anterior cervical discectomy and fusion.  I discussed the planned procedure at length with the patient, including the risks, benefits, alternatives, and indications. The risks discussed include but are not limited to bleeding, infection, need for reoperation, spinal fluid leak, stroke, vision loss, anesthetic complication, coma, paralysis, and even death. We also discussed the possibility of post-operative dysphagia, vocal cord paralysis, and the risk of adjacent segment disease in the future. I also described in detail that improvement was not guaranteed.  The patient expressed understanding of these risks, and asked that we proceed with surgery. I described the surgery in layman's terms, and gave ample opportunity for questions, which were answered to the best of my ability.  I spent a total of 30 minutes in this patient's care today. This time was spent reviewing  pertinent records including imaging studies, obtaining and confirming history, performing a directed evaluation, formulating and discussing my recommendations, and documenting the visit within the medical record.     Thank you for involving me in the care of this patient.      Saleen Peden K. Clois MD, [facility] Neurosurgery

## 2024-04-17 ENCOUNTER — Ambulatory Visit
Admission: RE | Admit: 2024-04-17 | Discharge: 2024-04-17 | Disposition: A | Discharge status: Home / Self Care | Source: Ambulatory Visit | Attending: Urology | Admitting: Urology

## 2024-04-17 DIAGNOSIS — R1013 Epigastric pain: Secondary | ICD-10-CM | POA: Insufficient documentation

## 2024-04-18 ENCOUNTER — Encounter: Payer: Self-pay | Admitting: Neurosurgery

## 2024-04-18 ENCOUNTER — Ambulatory Visit: Admitting: Neurosurgery

## 2024-04-18 ENCOUNTER — Other Ambulatory Visit: Payer: Self-pay

## 2024-04-18 VITALS — BP 110/72 | Ht 70.5 in | Wt 275.4 lb

## 2024-04-18 DIAGNOSIS — M4316 Spondylolisthesis, lumbar region: Secondary | ICD-10-CM | POA: Diagnosis not present

## 2024-04-18 DIAGNOSIS — Z01818 Encounter for other preprocedural examination: Secondary | ICD-10-CM

## 2024-04-18 DIAGNOSIS — M4802 Spinal stenosis, cervical region: Secondary | ICD-10-CM

## 2024-04-18 DIAGNOSIS — G8929 Other chronic pain: Secondary | ICD-10-CM

## 2024-04-18 DIAGNOSIS — M5412 Radiculopathy, cervical region: Secondary | ICD-10-CM | POA: Diagnosis not present

## 2024-04-18 NOTE — Patient Instructions (Signed)
 Please see below for information in regards to your upcoming surgery:   Planned surgery: C5-7 anterior cervical discectomy and fusion   Surgery date: 05/15/24 at [facility] (Medical Mall: [address] [place], [place] [address]) - you will find out your arrival time the business day before your surgery.   Pre-op appointment at [facility] Pre-admit Testing: you will receive a call with a date/time for this appointment. If you are scheduled for an in person appointment, Pre-admit Testing is located on the first floor of the [facility] Arts building, [address], [address]. During this appointment, they will advise you which medications you can take the morning of surgery, and which medications you will need to hold for surgery. Labs (such as blood work, EKG) may be done at your pre-op appointment. You are not required to fast for these labs. Should you need to change your pre-op appointment, please call Pre-admit testing at [phone].      Blood thinners:    Aspirin  81mg :  OK to stay on aspirin  81mg     Surgical clearance: we will send a clearance form to Dr Watt. They may wish to see you in their office prior to signing the clearance form. If so, they may call you to schedule an appointment.     NSAIDS (Non-steroidal anti-inflammatory drugs): because you are having a fusion, please avoid taking any NSAIDS (examples: ibuprofen , motrin , aleve, naproxen, meloxicam , diclofenac ) for 3 months after surgery. Celebrex is an exception and is OK to take, if prescribed. Tylenol  is not an NSAID.    Common restrictions after surgery: No bending, lifting, or twisting ("BLT"). Avoid lifting objects heavier than 10 pounds for the first 6 weeks after surgery. Where possible, avoid household activities that involve lifting, bending, reaching, pushing, or pulling such as laundry, vacuuming, grocery shopping, and childcare. Try to arrange for help from friends  and family for these activities while you heal. Do not drive while taking prescription pain medication. Weeks 6 through 12 after surgery: avoid lifting more than 25 pounds.    X-rays after surgery: Because you are having a fusion: for appointments after your 2 week follow-up: please arrive at the [facility] outpatient imaging center ([address], [place]) or [facility] one hour prior to your appointment for x-rays. This applies to every appointment after your 2 week follow-up. Failure to do so may result in your appointment being rescheduled.   How to contact us :  If you have any questions/concerns before or after surgery, you can reach us  at [phone], or you can send a mychart message. We can be reached by phone or mychart 8am-4pm, Monday-Friday.  *Please note: Calls after 4pm are forwarded to a third party answering service. Mychart messages are not routinely monitored during evenings, weekends, and holidays. Please call our office to contact the answering service for urgent concerns during non-business hours.    If you have FMLA/disability paperwork, please drop it off or fax it to [phone]   Appointments/FMLA & disability paperwork: [employer] Registered Nurse/Surgery scheduler: Daymond Cordts, RN Certified Medical Assistants: Don, CMA, Elenor, CMA, & Damien, CMA Physician Assistants: Lyle Decamp, PA-C, Edsel Goods, PA-C & Glade Boys, PA-C Surgeons: Penne Sharps, MD & Reeves Daisy, MD   [facility] PREADMIT TESTING VISIT and SURGERY INFORMATION SHEET   Now that surgery has been scheduled you can anticipate several phone calls from [facility] services. A pharmacy technician will call you to verify  your current list of medications taken at home.               The Pre-Service Center will call to verify your insurance information and to give you billing estimates and information.             The  Preadmit Testing Office will be calling to schedule a visit to obtain information for the anesthesia team and provide instructions on preparation for surgery.  What can you expect for the Preadmit Testing Visit: Appointments may be scheduled in-person or by telephone.  If a telephone visit is scheduled, you may be asked to come into the office to have lab tests or other studies performed.   This visit will not be completed any greater than 14 days prior to your surgery.  If your surgery has been scheduled for a future date, please do not be alarmed if we have not contacted you to schedule an appointment more than a month prior to the surgery date.    Please be prepared to provide the following information during this appointment:            -Personal medical history                                               -Medication and allergy list            -Any history of problems with anesthesia              -Recent lab work or diagnostic studies            -Please notify us  of any needs we should be aware of to provide the best care possible           -You will be provided with instructions on how to prepare for your surgery.    On The Day of Surgery:  You must have a driver to take you home after surgery, you will be asked not to drive for 24 hours following surgery.  Taxi, Gisele and non-medical transport will not be acceptable means of transportation unless you have a responsible individual who will be traveling with you.  Visitors in the surgical area:   2 people will be able to visit you in your room once your preparation for surgery has been completed. During surgery, your visitors will be asked to wait in the Surgery Waiting Area.  It is not a requirement for them to stay, if they prefer to leave and come back.  Your visitor(s) will be given an update once the surgery has been completed.  No visitors are allowed in the initial recovery room to respect patient privacy and safety.  Once you are  more awake and transfer to the secondary recovery area, or are transferred to an inpatient room, visitors will again be able to see you.  To respect and protect your privacy: We will ask on the day of surgery who your driver will be and what the contact number for that individual will be. We will ask if it is okay to share information with this individual, or if there is an alternative individual that we, or the surgeon, should contact to provide updates and information. If family or friends come to the surgical information desk requesting information about you, who you have not listed with us , no information will be given.  It may be helpful to designate someone as the main contact who will be responsible for updating your other friends and family.    PREADMIT TESTING OFFICE: [phone] SAME DAY SURGERY: [phone] We look forward to caring for you before and throughout the process of your surgery.

## 2024-04-18 NOTE — Addendum Note (Signed)
 Addended by: Zira Helinski on: 04/18/2024 09:54 AM   Modules accepted: Orders

## 2024-04-19 ENCOUNTER — Encounter: Payer: Self-pay | Admitting: Advanced Practice Midwife

## 2024-04-23 ENCOUNTER — Ambulatory Visit: Payer: Self-pay | Admitting: Urology

## 2024-04-24 ENCOUNTER — Other Ambulatory Visit: Payer: Self-pay | Admitting: Family Medicine

## 2024-05-03 ENCOUNTER — Other Ambulatory Visit: Payer: Self-pay | Admitting: Family Medicine

## 2024-05-06 ENCOUNTER — Other Ambulatory Visit

## 2024-05-08 ENCOUNTER — Other Ambulatory Visit: Payer: Self-pay

## 2024-05-08 ENCOUNTER — Encounter
Admission: RE | Admit: 2024-05-08 | Discharge: 2024-05-08 | Disposition: A | Discharge status: Home / Self Care | Source: Ambulatory Visit | Attending: Neurosurgery | Admitting: Neurosurgery

## 2024-05-08 VITALS — BP 117/72 | HR 67 | Resp 14 | Ht 70.5 in | Wt 272.0 lb

## 2024-05-08 DIAGNOSIS — E119 Type 2 diabetes mellitus without complications: Secondary | ICD-10-CM | POA: Insufficient documentation

## 2024-05-08 DIAGNOSIS — Z01818 Encounter for other preprocedural examination: Secondary | ICD-10-CM | POA: Insufficient documentation

## 2024-05-08 DIAGNOSIS — Z01810 Encounter for preprocedural cardiovascular examination: Secondary | ICD-10-CM

## 2024-05-08 DIAGNOSIS — Z01812 Encounter for preprocedural laboratory examination: Secondary | ICD-10-CM | POA: Diagnosis present

## 2024-05-08 DIAGNOSIS — M5412 Radiculopathy, cervical region: Secondary | ICD-10-CM

## 2024-05-08 HISTORY — DX: Anemia, unspecified: D64.9

## 2024-05-08 HISTORY — DX: Dyspnea, unspecified: R06.00

## 2024-05-08 HISTORY — DX: Radiculopathy, cervical region: M54.12

## 2024-05-08 HISTORY — DX: Morbid (severe) obesity due to excess calories: E66.01

## 2024-05-08 LAB — TYPE AND SCREEN
ABO/RH(D): A POS
Antibody Screen: NEGATIVE

## 2024-05-08 LAB — SURGICAL PCR SCREEN
MRSA, PCR: NEGATIVE
Staphylococcus aureus: NEGATIVE

## 2024-05-08 NOTE — Patient Instructions (Addendum)
 Your procedure is scheduled on:05-15-24 Wednesday Report to the Registration Desk on the 1st floor of the Medical Mall.Then proceed to the 2nd floor Surgery Desk To find out your arrival time, please call [phone] between 1PM - 3PM on:05-14-24 Tuesday If your arrival time is 6:00 am, do not arrive before that time as the Medical Mall entrance doors do not open until 6:00 am.  REMEMBER: Instructions that are not followed completely may result in serious medical risk, up to and including death; or upon the discretion of your surgeon and anesthesiologist your surgery may need to be rescheduled.  Do not eat food after midnight the night before surgery.  No gum chewing or hard candies.  You may however, drink Water up to 2 hours before you are scheduled to arrive for your surgery. Do not drink anything within 2 hours of your scheduled arrival time.  One week prior to surgery:Stop NOW (05-15-24) Stop ANY OVER THE COUNTER supplements until after surgery (Vitamin C, Vitamin B12, Melatonin, Ferrous Sulfate)  Continue taking all of your other prescription medications up until the day of surgery.  ON THE DAY OF SURGERY ONLY TAKE THESE MEDICATIONS WITH SIPS OF WATER: -buPROPion  (WELLBUTRIN  XL)  -pantoprazole  (PROTONIX )  -tamsulosin  (FLOMAX )   Continue your 81 mg Aspirin  up until the day prior to surgery-Do NOT take the morning of surgery  No Alcohol for 24 hours before or after surgery.  No Smoking including e-cigarettes for 24 hours before surgery.  No chewable tobacco products for at least 6 hours before surgery.  No nicotine patches on the day of surgery.  Do not use any recreational drugs for at least a week (preferably 2 weeks) before your surgery.  Please be advised that the combination of cocaine and anesthesia may have negative outcomes, up to and including death. If you test positive for cocaine, your surgery will be cancelled.  On the morning of surgery brush your teeth with  toothpaste and water, you may rinse your mouth with mouthwash if you wish. Do not swallow any toothpaste or mouthwash.  Use CHG Soap as directed on instruction sheet.  Do not wear jewelry, make-up, hairpins, clips or nail polish.  For welded (permanent) jewelry: bracelets, anklets, waist bands, etc.  Please have this removed prior to surgery.  If it is not removed, there is a chance that hospital personnel will need to cut it off on the day of surgery.  Do not wear lotions, powders, or perfumes.   Do not shave body hair from the neck down 48 hours before surgery.  Contact lenses, hearing aids and dentures may not be worn into surgery.  Do not bring valuables to the hospital. [facility] is not responsible for any missing/lost belongings or valuables.   Bring your C-PAP to the hospital   Notify your doctor if there is any change in your medical condition (cold, fever, infection).  Wear comfortable clothing (specific to your surgery type) to the hospital.  After surgery, you can help prevent lung complications by doing breathing exercises.  Take deep breaths and cough every 1-2 hours. Your doctor may order a device called an Incentive Spirometer to help you take deep breaths. When coughing or sneezing, hold a pillow firmly against your incision with both hands. This is called "splinting." Doing this helps protect your incision. It also decreases belly discomfort.  If you are being admitted to the hospital overnight, leave your suitcase in the car. After surgery it may be brought to your room.  In case of increased patient census, it may be necessary for you, the patient, to continue your postoperative care in the Same Day Surgery department.  If you are being discharged the day of surgery, you will not be allowed to drive home. You will need a responsible individual to drive you home and stay with you for 24 hours after surgery.   If you are taking public transportation, you will need  to have a responsible individual with you.  Please call the Pre-admissions Testing Dept. at [phone] if you have any questions about these instructions.  Surgery Visitation Policy:  Patients having surgery or a procedure may have two visitors.  Children under the age of 22 must have an adult with them who is not the patient.  Inpatient Visitation:    Visiting hours are 7 a.m. to 8 p.m. Up to four visitors are allowed at one time in a patient room. The visitors may rotate out with other people during the day.  One visitor age 87 or older may stay with the patient overnight and must be in the room by 8 p.m.    Pre-operative 5 CHG Bath Instructions   You can play a key role in reducing the risk of infection after surgery. Your skin needs to be as free of germs as possible. You can reduce the number of germs on your skin by washing with CHG (chlorhexidine gluconate) soap before surgery. CHG is an antiseptic soap that kills germs and continues to kill germs even after washing.   DO NOT use if you have an allergy to chlorhexidine/CHG or antibacterial soaps. If your skin becomes reddened or irritated, stop using the CHG and notify one of our RNs at [phone].   Please shower with the CHG soap starting 4 days before surgery using the following schedule:     Please keep in mind the following:  DO NOT shave, including legs and underarms, starting the day of your first shower.   You may shave your face at any point before/day of surgery.  Place clean sheets on your bed the day you start using CHG soap. Use a clean washcloth (not used since being washed) for each shower. DO NOT sleep with pets once you start using the CHG.   CHG Shower Instructions:  If you choose to wash your hair and private area, wash first with your normal shampoo/soap.  After you use shampoo/soap, rinse your hair and body thoroughly to remove shampoo/soap residue.  Turn the water OFF and apply about 3  tablespoons (45 ml) of CHG soap to a CLEAN washcloth.  Apply CHG soap ONLY FROM YOUR NECK DOWN TO YOUR TOES (washing for 3-5 minutes)  DO NOT use CHG soap on face, private areas, open wounds, or sores.  Pay special attention to the area where your surgery is being performed.  If you are having back surgery, having someone wash your back for you may be helpful. Wait 2 minutes after CHG soap is applied, then you may rinse off the CHG soap.  Pat dry with a clean towel  Put on clean clothes/pajamas   If you choose to wear lotion, please use ONLY the CHG-compatible lotions on the back of this paper.     Additional instructions for the day of surgery: DO NOT APPLY any lotions, deodorants, cologne, or perfumes.   Put on clean/comfortable clothes.  Brush your teeth.  Ask your nurse before applying any prescription medications to the skin.  CHG Compatible Lotions   Aveeno Moisturizing lotion  Cetaphil Moisturizing Cream  Cetaphil Moisturizing Lotion  Clairol Herbal Essence Moisturizing Lotion, Dry Skin  Clairol Herbal Essence Moisturizing Lotion, Extra Dry Skin  Clairol Herbal Essence Moisturizing Lotion, Normal Skin  Curel Age Defying Therapeutic Moisturizing Lotion with Alpha Hydroxy  Curel Extreme Care Body Lotion  Curel Soothing Hands Moisturizing Hand Lotion  Curel Therapeutic Moisturizing Cream, Fragrance-Free  Curel Therapeutic Moisturizing Lotion, Fragrance-Free  Curel Therapeutic Moisturizing Lotion, Original Formula  Eucerin Daily Replenishing Lotion  Eucerin Dry Skin Therapy Plus Alpha Hydroxy Crme  Eucerin Dry Skin Therapy Plus Alpha Hydroxy Lotion  Eucerin Original Crme  Eucerin Original Lotion  Eucerin Plus Crme Eucerin Plus Lotion  Eucerin TriLipid Replenishing Lotion  Keri Anti-Bacterial Hand Lotion  Keri Deep Conditioning Original Lotion Dry Skin Formula Softly Scented  Keri Deep Conditioning Original Lotion, Fragrance Free Sensitive Skin Formula  Keri  Lotion Fast Absorbing Fragrance Free Sensitive Skin Formula  Keri Lotion Fast Absorbing Softly Scented Dry Skin Formula  Keri Original Lotion  Keri Skin Renewal Lotion Keri Silky Smooth Lotion  Keri Silky Smooth Sensitive Skin Lotion  Nivea Body Creamy Conditioning Oil  Nivea Body Extra Enriched Teacher, adult education Moisturizing Lotion Nivea Crme  Nivea Skin Firming Lotion  NutraDerm 30 Skin Lotion  NutraDerm Skin Lotion  NutraDerm Therapeutic Skin Cream  NutraDerm Therapeutic Skin Lotion  ProShield Protective Hand Cream  Provon moisturizing lotion   Merchandiser, retail to address health-related social needs:  [email or web address]

## 2024-05-14 MED ORDER — ORAL CARE MOUTH RINSE: 15.0000 mL | Freq: Once | OROMUCOSAL | Status: AC

## 2024-05-14 MED ORDER — CHLORHEXIDINE GLUCONATE 0.12 % MT SOLN
15.0000 mL | Freq: Once | OROMUCOSAL | Status: AC
  Administered 2024-05-15 (×2): 15 mL via OROMUCOSAL

## 2024-05-14 MED ORDER — CEFAZOLIN IN SODIUM CHLORIDE 2-0.9 GM/100ML-% IV SOLN
3.0000 g | Freq: Once | INTRAVENOUS | Status: DC
  Filled 2024-05-14: qty 200

## 2024-05-14 MED ORDER — CEFAZOLIN SODIUM-DEXTROSE 3-4 GM/150ML-% IV SOLN
3.0000 g | INTRAVENOUS | Status: AC
  Administered 2024-05-15 (×2): 3 g via INTRAVENOUS
  Filled 2024-05-14: qty 150

## 2024-05-14 MED ORDER — SODIUM CHLORIDE 0.9 % IV SOLN: INTRAVENOUS | Status: DC

## 2024-05-15 ENCOUNTER — Encounter
Admission: RE | Disposition: A | Discharge status: Home / Self Care | Payer: Self-pay | Source: Home / Self Care | Attending: Neurosurgery

## 2024-05-15 ENCOUNTER — Ambulatory Visit

## 2024-05-15 ENCOUNTER — Encounter: Payer: Self-pay | Admitting: Neurosurgery

## 2024-05-15 ENCOUNTER — Ambulatory Visit
Admission: RE | Admit: 2024-05-15 | Discharge: 2024-05-15 | Disposition: A | Discharge status: Home / Self Care | Attending: Neurosurgery | Admitting: Neurosurgery

## 2024-05-15 ENCOUNTER — Ambulatory Visit: Payer: Self-pay | Admitting: Urgent Care

## 2024-05-15 ENCOUNTER — Other Ambulatory Visit: Admitting: Urology

## 2024-05-15 ENCOUNTER — Other Ambulatory Visit: Payer: Self-pay

## 2024-05-15 DIAGNOSIS — K219 Gastro-esophageal reflux disease without esophagitis: Secondary | ICD-10-CM | POA: Diagnosis not present

## 2024-05-15 DIAGNOSIS — E119 Type 2 diabetes mellitus without complications: Secondary | ICD-10-CM | POA: Diagnosis not present

## 2024-05-15 DIAGNOSIS — Z87891 Personal history of nicotine dependence: Secondary | ICD-10-CM | POA: Insufficient documentation

## 2024-05-15 DIAGNOSIS — M5412 Radiculopathy, cervical region: Secondary | ICD-10-CM

## 2024-05-15 DIAGNOSIS — Z01812 Encounter for preprocedural laboratory examination: Secondary | ICD-10-CM

## 2024-05-15 DIAGNOSIS — Z981 Arthrodesis status: Secondary | ICD-10-CM | POA: Diagnosis not present

## 2024-05-15 DIAGNOSIS — M4322 Fusion of spine, cervical region: Secondary | ICD-10-CM | POA: Diagnosis not present

## 2024-05-15 DIAGNOSIS — E785 Hyperlipidemia, unspecified: Secondary | ICD-10-CM | POA: Diagnosis not present

## 2024-05-15 DIAGNOSIS — M50123 Cervical disc disorder at C6-C7 level with radiculopathy: Secondary | ICD-10-CM | POA: Diagnosis not present

## 2024-05-15 DIAGNOSIS — F419 Anxiety disorder, unspecified: Secondary | ICD-10-CM | POA: Diagnosis not present

## 2024-05-15 DIAGNOSIS — F32A Depression, unspecified: Secondary | ICD-10-CM | POA: Insufficient documentation

## 2024-05-15 DIAGNOSIS — M4802 Spinal stenosis, cervical region: Secondary | ICD-10-CM | POA: Diagnosis not present

## 2024-05-15 DIAGNOSIS — M50122 Cervical disc disorder at C5-C6 level with radiculopathy: Secondary | ICD-10-CM | POA: Insufficient documentation

## 2024-05-15 DIAGNOSIS — Z01818 Encounter for other preprocedural examination: Secondary | ICD-10-CM

## 2024-05-15 HISTORY — PX: ANTERIOR CERVICAL DECOMP/DISCECTOMY FUSION: SHX1161

## 2024-05-15 LAB — GLUCOSE, CAPILLARY
Glucose-Capillary: 105 mg/dL — ABNORMAL HIGH (ref 70–99)
Glucose-Capillary: 123 mg/dL — ABNORMAL HIGH (ref 70–99)

## 2024-05-15 LAB — ABO/RH: ABO/RH(D): A POS

## 2024-05-15 SURGERY — ANTERIOR CERVICAL DECOMPRESSION/DISCECTOMY FUSION 2 LEVELS
Anesthesia: General | Site: Spine Cervical

## 2024-05-15 MED ORDER — METHOCARBAMOL 500 MG PO TABS
500.0000 mg | ORAL_TABLET | Freq: Four times a day (QID) | ORAL | 0 refills | Status: DC
  Filled 2024-05-15: qty 120, 30d supply, fill #0

## 2024-05-15 MED ORDER — FENTANYL CITRATE (PF) 100 MCG/2ML IJ SOLN
25.0000 ug | INTRAMUSCULAR | Status: DC | PRN
  Administered 2024-05-15 (×8): 25 ug via INTRAVENOUS

## 2024-05-15 MED ORDER — MIDAZOLAM HCL 2 MG/2ML IJ SOLN
INTRAMUSCULAR | Status: DC | PRN
  Administered 2024-05-15 (×2): 2 mg via INTRAVENOUS

## 2024-05-15 MED ORDER — REMIFENTANIL HCL 1 MG IV SOLR
INTRAVENOUS | Status: AC
  Filled 2024-05-15: qty 1000

## 2024-05-15 MED ORDER — OXYCODONE HCL 5 MG PO TABS
ORAL_TABLET | ORAL | Status: AC
  Filled 2024-05-15: qty 1

## 2024-05-15 MED ORDER — CHLORHEXIDINE GLUCONATE 0.12 % MT SOLN
OROMUCOSAL | Status: AC
  Filled 2024-05-15: qty 15

## 2024-05-15 MED ORDER — DEXMEDETOMIDINE HCL IN NACL 80 MCG/20ML IV SOLN
INTRAVENOUS | Status: AC
  Filled 2024-05-15: qty 20

## 2024-05-15 MED ORDER — SUCCINYLCHOLINE CHLORIDE 200 MG/10ML IV SOSY
PREFILLED_SYRINGE | INTRAVENOUS | Status: DC | PRN
  Administered 2024-05-15 (×2): 140 mg via INTRAVENOUS

## 2024-05-15 MED ORDER — REMIFENTANIL BOLUS VIA INFUSION OPTIME: INTRAVENOUS | Status: DC | PRN

## 2024-05-15 MED ORDER — FENTANYL CITRATE (PF) 100 MCG/2ML IJ SOLN
INTRAMUSCULAR | Status: DC | PRN
  Administered 2024-05-15 (×2): 50 ug via INTRAVENOUS

## 2024-05-15 MED ORDER — OXYCODONE HCL 5 MG PO TABS
5.0000 mg | ORAL_TABLET | ORAL | 0 refills | Status: AC | PRN
  Filled 2024-05-15: qty 30, 5d supply, fill #0

## 2024-05-15 MED ORDER — PHENYLEPHRINE 80 MCG/ML (10ML) SYRINGE FOR IV PUSH (FOR BLOOD PRESSURE SUPPORT)
PREFILLED_SYRINGE | INTRAVENOUS | Status: DC | PRN
Start: 2024-05-15 — End: 2024-05-15
  Administered 2024-05-15 (×2): 120 ug via INTRAVENOUS
  Administered 2024-05-15 (×2): 160 ug via INTRAVENOUS
  Administered 2024-05-15 (×3): 80 ug via INTRAVENOUS
  Administered 2024-05-15 (×3): 160 ug via INTRAVENOUS
  Administered 2024-05-15: 80 ug via INTRAVENOUS
  Administered 2024-05-15: 160 ug via INTRAVENOUS
  Administered 2024-05-15: 80 ug via INTRAVENOUS
  Administered 2024-05-15 (×6): 160 ug via INTRAVENOUS
  Administered 2024-05-15: 80 ug via INTRAVENOUS

## 2024-05-15 MED ORDER — SURGIFLO WITH THROMBIN (HEMOSTATIC MATRIX KIT) OPTIME
TOPICAL | Status: DC | PRN
  Administered 2024-05-15 (×2): 1 via TOPICAL

## 2024-05-15 MED ORDER — GLYCOPYRROLATE 0.2 MG/ML IJ SOLN
INTRAMUSCULAR | Status: DC | PRN
  Administered 2024-05-15 (×2): .1 mg via INTRAVENOUS

## 2024-05-15 MED ORDER — DEXAMETHASONE SODIUM PHOSPHATE 10 MG/ML IJ SOLN
INTRAMUSCULAR | Status: DC | PRN
  Administered 2024-05-15 (×2): 10 mg via INTRAVENOUS

## 2024-05-15 MED ORDER — PROPOFOL 10 MG/ML IV BOLUS
INTRAVENOUS | Status: DC | PRN
  Administered 2024-05-15 (×2): 200 mg via INTRAVENOUS
  Administered 2024-05-15 (×2): 100 ug/kg/min via INTRAVENOUS
  Administered 2024-05-15 (×2): 50 mg via INTRAVENOUS

## 2024-05-15 MED ORDER — LACTATED RINGERS IV SOLN: INTRAVENOUS | Status: DC | PRN

## 2024-05-15 MED ORDER — MIDAZOLAM HCL 2 MG/2ML IJ SOLN
INTRAMUSCULAR | Status: AC
  Filled 2024-05-15: qty 2

## 2024-05-15 MED ORDER — 0.9 % SODIUM CHLORIDE (POUR BTL) OPTIME
TOPICAL | Status: DC | PRN
  Administered 2024-05-15 (×2): 500 mL

## 2024-05-15 MED ORDER — SUCCINYLCHOLINE CHLORIDE 200 MG/10ML IV SOSY
PREFILLED_SYRINGE | INTRAVENOUS | Status: AC
  Filled 2024-05-15: qty 10

## 2024-05-15 MED ORDER — LIDOCAINE HCL (CARDIAC) PF 100 MG/5ML IV SOSY
PREFILLED_SYRINGE | INTRAVENOUS | Status: DC | PRN
  Administered 2024-05-15 (×2): 100 mg via INTRAVENOUS

## 2024-05-15 MED ORDER — DEXAMETHASONE SODIUM PHOSPHATE 10 MG/ML IJ SOLN
INTRAMUSCULAR | Status: AC
Start: 2024-05-15 — End: 2024-05-15
  Filled 2024-05-15: qty 1

## 2024-05-15 MED ORDER — PHENYLEPHRINE HCL-NACL 20-0.9 MG/250ML-% IV SOLN
INTRAVENOUS | Status: AC
  Filled 2024-05-15: qty 250

## 2024-05-15 MED ORDER — PROPOFOL 1000 MG/100ML IV EMUL
INTRAVENOUS | Status: AC
  Filled 2024-05-15: qty 100

## 2024-05-15 MED ORDER — OXYCODONE HCL 5 MG PO TABS
10.0000 mg | ORAL_TABLET | Freq: Once | ORAL | Status: AC
  Administered 2024-05-15 (×2): 10 mg via ORAL

## 2024-05-15 MED ORDER — FENTANYL CITRATE (PF) 100 MCG/2ML IJ SOLN
INTRAMUSCULAR | Status: AC
Start: 2024-05-15 — End: 2024-05-15
  Filled 2024-05-15: qty 2

## 2024-05-15 MED ORDER — KETAMINE HCL 10 MG/ML IJ SOLN
INTRAMUSCULAR | Status: DC | PRN
  Administered 2024-05-15 (×2): 50 mg via INTRAVENOUS

## 2024-05-15 MED ORDER — GLYCOPYRROLATE 0.2 MG/ML IJ SOLN
INTRAMUSCULAR | Status: AC
  Filled 2024-05-15: qty 1

## 2024-05-15 MED ORDER — DROPERIDOL 2.5 MG/ML IJ SOLN
INTRAMUSCULAR | Status: AC
  Filled 2024-05-15: qty 2

## 2024-05-15 MED ORDER — PHENYLEPHRINE HCL-NACL 20-0.9 MG/250ML-% IV SOLN
INTRAVENOUS | Status: DC | PRN
  Administered 2024-05-15 (×2): 50 ug/min via INTRAVENOUS

## 2024-05-15 MED ORDER — OXYCODONE HCL 5 MG/5ML PO SOLN: 5.0000 mg | Freq: Once | ORAL | Status: DC | PRN

## 2024-05-15 MED ORDER — OXYCODONE HCL 5 MG PO TABS: 5.0000 mg | ORAL_TABLET | Freq: Once | ORAL | Status: DC | PRN

## 2024-05-15 MED ORDER — METHOCARBAMOL 500 MG PO TABS
500.0000 mg | ORAL_TABLET | Freq: Once | ORAL | Status: AC
  Administered 2024-05-15 (×2): 500 mg via ORAL

## 2024-05-15 MED ORDER — ACETAMINOPHEN 10 MG/ML IV SOLN
INTRAVENOUS | Status: DC | PRN
  Administered 2024-05-15 (×2): 1000 mg via INTRAVENOUS

## 2024-05-15 MED ORDER — METHOCARBAMOL 500 MG PO TABS
ORAL_TABLET | ORAL | Status: AC
  Filled 2024-05-15: qty 1

## 2024-05-15 MED ORDER — REMIFENTANIL HCL 1 MG IV SOLR
INTRAVENOUS | Status: AC
Start: 2024-05-15 — End: 2024-05-15
  Filled 2024-05-15: qty 1000

## 2024-05-15 MED ORDER — DROPERIDOL 2.5 MG/ML IJ SOLN
0.6250 mg | Freq: Once | INTRAMUSCULAR | Status: AC | PRN
  Administered 2024-05-15 (×2): 0.625 mg via INTRAVENOUS

## 2024-05-15 MED ORDER — SENNA 8.6 MG PO TABS
1.0000 | ORAL_TABLET | Freq: Two times a day (BID) | ORAL | 0 refills | Status: DC | PRN
  Filled 2024-05-15: qty 30, 15d supply, fill #0

## 2024-05-15 MED ORDER — ONDANSETRON HCL 4 MG/2ML IJ SOLN
INTRAMUSCULAR | Status: AC
  Filled 2024-05-15: qty 2

## 2024-05-15 MED ORDER — KETAMINE HCL 50 MG/5ML IJ SOSY
PREFILLED_SYRINGE | INTRAMUSCULAR | Status: AC
  Filled 2024-05-15: qty 5

## 2024-05-15 MED ORDER — PROPOFOL 10 MG/ML IV BOLUS
INTRAVENOUS | Status: AC
  Filled 2024-05-15: qty 20

## 2024-05-15 MED ORDER — ACETAMINOPHEN 10 MG/ML IV SOLN
INTRAVENOUS | Status: AC
Start: 2024-05-15 — End: 2024-05-15
  Filled 2024-05-15: qty 100

## 2024-05-15 MED ORDER — LIDOCAINE HCL (PF) 2 % IJ SOLN
INTRAMUSCULAR | Status: AC
Start: 2024-05-15 — End: 2024-05-15
  Filled 2024-05-15: qty 5

## 2024-05-15 MED ORDER — ACETAMINOPHEN 10 MG/ML IV SOLN
1000.0000 mg | Freq: Once | INTRAVENOUS | Status: DC | PRN
Start: 2024-05-15 — End: 2024-05-15

## 2024-05-15 MED ORDER — BUPIVACAINE-EPINEPHRINE (PF) 0.5% -1:200000 IJ SOLN
INTRAMUSCULAR | Status: DC | PRN
  Administered 2024-05-15 (×2): 10 mL

## 2024-05-15 MED ORDER — SODIUM CHLORIDE 0.9 % IV SOLN
INTRAVENOUS | Status: DC | PRN
  Administered 2024-05-15 (×2): .2 ug/kg/min via INTRAVENOUS

## 2024-05-15 MED ORDER — ONDANSETRON HCL 4 MG/2ML IJ SOLN
INTRAMUSCULAR | Status: DC | PRN
  Administered 2024-05-15 (×2): 4 mg via INTRAVENOUS

## 2024-05-15 SURGICAL SUPPLY — 38 items
ALLOGRAFT BONE FIBER KORE 1CC (Bone Implant) IMPLANT
BASIN KIT SINGLE STR (MISCELLANEOUS) ×2 IMPLANT
BUR NEURO DRILL SOFT 3.0X3.8M (BURR) ×2 IMPLANT
DERMABOND ADVANCED .7 DNX12 (GAUZE/BANDAGES/DRESSINGS) ×2 IMPLANT
DRAIN CHANNEL JP 10F RND 20C F (MISCELLANEOUS) IMPLANT
DRAPE C ARM PK CFD 31 SPINE (DRAPES) ×2 IMPLANT
DRAPE LAPAROTOMY 77X122 PED (DRAPES) ×2 IMPLANT
DRAPE MICROSCOPE SPINE 48X150 (DRAPES) ×2 IMPLANT
DRSG TEGADERM 4X4.75 (GAUZE/BANDAGES/DRESSINGS) IMPLANT
ELECTRODE REM PT RTRN 9FT ADLT (ELECTROSURGICAL) ×2 IMPLANT
EVACUATOR SILICONE 100CC (DRAIN) IMPLANT
FEE INTRAOP CADWELL SUPPLY NCS (MISCELLANEOUS) IMPLANT
FEE INTRAOP MONITOR IMPULS NCS (MISCELLANEOUS) IMPLANT
GAUZE SPONGE 2X2 STRL 8-PLY (GAUZE/BANDAGES/DRESSINGS) IMPLANT
GLOVE BIOGEL PI IND STRL 6.5 (GLOVE) ×2 IMPLANT
GLOVE SURG SYN 6.5 PF PI (GLOVE) ×2 IMPLANT
GLOVE SURG SYN 8.5 PF PI (GLOVE) ×6 IMPLANT
GOWN SRG LRG LVL 4 IMPRV REINF (GOWNS) ×2 IMPLANT
GOWN SRG XL LVL 3 NONREINFORCE (GOWNS) ×2 IMPLANT
KIT TURNOVER KIT A (KITS) ×2 IMPLANT
MANIFOLD NEPTUNE II (INSTRUMENTS) ×2 IMPLANT
NS IRRIG 500ML POUR BTL (IV SOLUTION) ×2 IMPLANT
PACK LAMINECTOMY ARMC (PACKS) ×2 IMPLANT
PAD ARMBOARD POSITIONER FOAM (MISCELLANEOUS) ×4 IMPLANT
PIN CASPAR 14 (PIN) ×2 IMPLANT
PLATE ACP 1.6X38 2LVL (Plate) IMPLANT
SCREW ACP 3.5X17 S/D VARIA (Screw) IMPLANT
SPACER C HEDRON 12X14 7M 7D (Spacer) ×2 IMPLANT
SPACER HEDRON C 12X14X8 7D (Spacer) ×2 IMPLANT
SPONGE KITTNER 5P (MISCELLANEOUS) ×2 IMPLANT
STAPLER SKIN PROX 35W (STAPLE) IMPLANT
SURGIFLO W/THROMBIN 8M KIT (HEMOSTASIS) ×2 IMPLANT
SUT STRATA 3-0 15 PS-2 (SUTURE) ×2 IMPLANT
SUT VIC AB 3-0 SH 8-18 (SUTURE) ×2 IMPLANT
SUTURE EHLN 3-0 FS-10 30 BLK (SUTURE) IMPLANT
SYR 20ML LL LF (SYRINGE) ×2 IMPLANT
TAPE CLOTH 3X10 WHT NS LF (GAUZE/BANDAGES/DRESSINGS) ×4 IMPLANT
TRAP FLUID SMOKE EVACUATOR (MISCELLANEOUS) ×2 IMPLANT

## 2024-05-15 NOTE — Anesthesia Procedure Notes (Signed)
 Procedure Name: Intubation Date/Time: 05/15/2024 9:39 AM  Performed by: Blair Suzen BRAVO, RNPre-anesthesia Checklist: Patient identified, Emergency Drugs available, Suction available and Patient being monitored Patient Re-evaluated:Patient Re-evaluated prior to induction Oxygen Delivery Method: Circle System Utilized Preoxygenation: Pre-oxygenation with 100% oxygen Induction Type: IV induction Ventilation: Mask ventilation without difficulty Laryngoscope Size: Glidescope and 4 Grade View: Grade I Tube type: Oral Tube size: 7.5 mm Number of attempts: 1 Airway Equipment and Method: Stylet and Oral airway Placement Confirmation: ETT inserted through vocal cords under direct vision, positive ETCO2 and breath sounds checked- equal and bilateral Secured at: 23 cm Tube secured with: Tape Dental Injury: Teeth and Oropharynx as per pre-operative assessment

## 2024-05-15 NOTE — Op Note (Signed)
 Indications: [name] is a 68 y.o. male with M54.12 Cervical radiculopathy, M48.02 Spinal stenosis in cervical region . Due to ongoing symptoms and lack of reasonable conservative management options, the patient opted for surgical intervention.  Findings: stenosis, successful decompression  Preoperative Diagnosis: M54.12 Cervical radiculopathy, M48.02 Spinal stenosis in cervical region  Postoperative Diagnosis: same   EBL: 25 ml IVF: see anesthesia record Drains: none Disposition: Extubated and Stable to PACU Complications: none  No foley catheter was placed.  Preoperative Note:   Risks of surgery discussed and documented in clinic note.  Operative Note:   Procedure:  1) Anterior cervical diskectomy and fusion at C5/6 and C6/7 2) Anterior cervical instrumentation at C5 - 7 3) Placement of biomechanical devices at C5/6 and C6/7  4) Use of operative microscope 5) Use of flouroscopy   Procedure: After obtaining informed consent, the patient taken to the operating room, placed in supine position, general anesthesia induced.  The patient had a small shoulder roll placed behind their shoulders.  The patient received preop antibiotics and IV Decadron .  A timeout was performed.  The patient had a neck incision outlined, was prepped and draped in usual sterile fashion. The incision was injected with local anesthetic.   An incision was opened, dissection taken down medial to the carotid artery and jugular vein, lateral to the trachea and esophagus.  The prevertebral fascia identified and a localizing x-ray demonstrated the correct level.  The longus colli were dissected laterally, and self-retaining retractors placed to open the operative field. The microscope was then brought into the field.  With this complete, distractor pins were placed in the vertebral bodies of C5 and C7. The distractor was placed, and the annuli at C5/6 and C6/7 were opened using a bovie.  Curettes and pituitary  rongeurs used to remove the majority of C5/6 disk, then the drill was used to remove the posterior osteophyte and begin the foraminotomies. The nerve hook was used to elevate the posterior longitudinal ligament, which was then removed with Kerrison rongeurs to complete decompression of the spinal cord. The Kerrison rongeurs were then used to complete the foraminotomies bilaterally to decompress the nerve roots. The nerve hook could be passed out each foramen, ensuring decompression of the nerve roots. Meticulous hemostasis was obtained. A biomechanical device (Globus Hedron C 7 mm height x 14 mm width by 12 mm depth) was placed at C5/6. The device had been filled with demineralized bone matrix for aid in arthrodesis.  Please note that the procedure included removal of the disc, removal of the posterior osteophytes, and removal of the posterior longitudinal ligament to ensure decompression of the spinal cord.  Additionally, foraminotomies were performed on both sides of the spinal canal to decompress the nerve roots.  We then moved to the C6/7 level. Curettes and pituitary rongeurs used to remove the majority of the disk, then the drill was used to remove the posterior osteophyte and begin the foraminotomies. The nerve hook was used to elevate the posterior longitudinal ligament, which was then removed with Kerrison rongeurs to complete decompression of the spinal cord. The Kerrison rongeurs were then used to complete the foraminotomies bilaterally to decompress the nerve roots. The nerve hook could be passed out each foramen, ensuring decompression of the nerve roots. Meticulous hemostasis was obtained. A biomechanical device (Globus Hedron C 8 mm height x 14 mm width by 12 mm depth) was placed at C6/7. The device had been filled with demineralized bone matrix for aid in arthrodesis.  Please note that the procedure included removal of the disc, removal of the posterior osteophytes, and removal of the posterior  longitudinal ligament to ensure decompression of the spinal cord.  Additionally, foraminotomies were performed on both sides of the spinal canal to decompress the nerve roots.  The caspar distractor was removed, and bone wax used for hemostasis. A separate, 38 mm 3 segment Nuvasive ACP plate was chosen to bridge the 3 segments.  Two screws placed in each vertebral body, respectively making sure the screws were behind the locking mechanism.  Final AP and lateral radiographs were taken.   Please note that the plate is not inclusive to the biomechanical devices.  The anchoring mechanism of the plate is completely separate from the biomechanical devices.  With everything in good position, the wound was irrigated copiously and meticulous hemostasis obtained.  Wound was closed in 2 layers using interrupted inverted 3-0 Vicryl sutures in the platysma and 3-0 monocryl on the dermis.  The wound was dressed with dermabond, the head of bed at 30 degrees, taken to recovery room in stable condition.  No new postop neurological deficits were identified.  Sponge and pattie counts were correct at the end of the procedure.     I performed the entire procedure with the assistance of Edsel Goods PA as an Designer, television/film set. An assistant was required for this procedure due to the complexity.  The assistant provided assistance in tissue manipulation and suction, and was required for the successful and safe performance of the procedure. I performed the critical portions of the procedure.   Reeves Daisy MD

## 2024-05-15 NOTE — Interval H&P Note (Signed)
 History and Physical Interval Note:  05/15/2024 8:48 AM  Glendia LITTIE Qua  has presented today for surgery, with the diagnosis of M54.12 Cervical radiculopathy M48.02 Spinal stenosis in cervical region.  The various methods of treatment have been discussed with the patient and family. After consideration of risks, benefits and other options for treatment, the patient has consented to  Procedure(s) with comments: C5-7 ANTERIOR CERVICAL DISCECTOMY AND FUSION (N/A) - C5-7 ANTERIOR CERVICAL DISCECTOMY AND FUSION as a surgical intervention.  The patient's history has been reviewed, patient examined, no change in status, stable for surgery.  I have reviewed the patient's chart and labs.  Questions were answered to the patient's satisfaction.    Heart sounds normal no MRG. Chest Clear to Auscultation Bilaterally.  Bryan Kirby

## 2024-05-15 NOTE — Discharge Instructions (Signed)

## 2024-05-15 NOTE — Transfer of Care (Signed)
 Immediate Anesthesia Transfer of Care Note  Patient: Glendia LITTIE Qua  Procedure(s) Performed: C5-7 ANTERIOR CERVICAL DISCECTOMY AND FUSION (Spine Cervical)  Patient Location: PACU  Anesthesia Type:General  Level of Consciousness: drowsy and patient cooperative  Airway & Oxygen Therapy: Patient Spontanous Breathing and Patient connected to face mask oxygen  Post-op Assessment: Report given to RN and Post -op Vital signs reviewed and stable  Post vital signs: Reviewed and stable  Last Vitals:  Vitals Value Taken Time  BP 112/48 05/15/24 12:00  Temp    Pulse 71 05/15/24 12:03  Resp 0 05/15/24 12:03  SpO2 98 % 05/15/24 12:03  Vitals shown include unfiled device data.  Last Pain:  Vitals:   05/15/24 0738  TempSrc: Temporal  PainSc: 3          Complications: No notable events documented.

## 2024-05-15 NOTE — Anesthesia Preprocedure Evaluation (Signed)
 Anesthesia Evaluation  Patient identified by MRN, date of birth, ID band Patient awake    Reviewed: Allergy & Precautions, H&P , NPO status , Patient's Chart, lab work & pertinent test results, reviewed documented beta blocker date and time   Airway Mallampati: II  TM Distance: >3 FB Neck ROM: full    Dental  (+) Teeth Intact   Pulmonary neg pulmonary ROS, shortness of breath and with exertion, sleep apnea and Continuous Positive Airway Pressure Ventilation , pneumonia, resolved, former smoker   Pulmonary exam normal        Cardiovascular Exercise Tolerance: Good negative cardio ROS Normal cardiovascular exam Rhythm:regular Rate:Normal     Neuro/Psych  PSYCHIATRIC DISORDERS Anxiety Depression     Neuromuscular disease negative neurological ROS  negative psych ROS   GI/Hepatic negative GI ROS, Neg liver ROS,GERD  Medicated,,  Endo/Other  negative endocrine ROSdiabetes, Well Controlled    Renal/GU negative Renal ROS  negative genitourinary   Musculoskeletal   Abdominal   Peds  Hematology negative hematology ROS (+) Blood dyscrasia, anemia   Anesthesia Other Findings Past Medical History: No date: Allergic rhinitis due to pollen No date: Anemia No date: Anxiety No date: Cervical radiculopathy No date: Clotting disorder (HCC) No date: Colon polyps 03/02/2022: Controlled type 2 diabetes mellitus without complication,  without long-term current use of insulin  (HCC) 2000: Deep vein thrombosis (DVT) of distal vein of right lower  extremity (HCC)     Comment:  after surgery No date: Dyspnea     Comment:  from Long Covid No date: GERD (gastroesophageal reflux disease) No date: Hyperlipidemia 03/16/2007: Major depression in partial remission (HCC) No date: Morbid obesity (HCC) No date: OSA on CPAP 2021: Pneumonia Past Surgical History: No date: CARPAL TUNNEL RELEASE; Right No date: COLONOSCOPY No date:  CYSTECTOMY 2005: EYE SURGERY; Bilateral     Comment:  lazy eye correction No date: KNEE ARTHROSCOPY     Comment:  LEFT LAT MENISCUS TEAR No date: KNEE ARTHROSCOPY; Right No date: KNEE SURGERY     Comment:  LEFT KNEE CAP WIRING AND PATELLAR REATTACHMENT 10/02/2014: LEFT HEART CATHETERIZATION WITH CORONARY ANGIOGRAM; N/A     Comment:  Procedure: LEFT HEART CATHETERIZATION WITH CORONARY               ANGIOGRAM;  Surgeon: Jerel Balding, MD;  Location: [facility]               CATH LAB;  Service: Cardiovascular;  Laterality: N/A; No date: ORCHIECTOMY     Comment:  MALDEVELOPEMENT AFTER ORCHITIS No date: POLYPECTOMY     Comment:  OF PENIS BMI    Body Mass Index: 38.48 kg/m     Reproductive/Obstetrics negative OB ROS                              Anesthesia Physical Anesthesia Plan  ASA: 3  Anesthesia Plan: General ETT   Post-op Pain Management:    Induction:   PONV Risk Score and Plan: 3  Airway Management Planned:   Additional Equipment:   Intra-op Plan:   Post-operative Plan:   Informed Consent: I have reviewed the patients History and Physical, chart, labs and discussed the procedure including the risks, benefits and alternatives for the proposed anesthesia with the patient or authorized representative who has indicated his/her understanding and acceptance.     Dental Advisory Given  Plan Discussed with: CRNA  Anesthesia Plan Comments:  Anesthesia Quick Evaluation

## 2024-05-16 ENCOUNTER — Encounter: Payer: Self-pay | Admitting: Neurosurgery

## 2024-05-17 ENCOUNTER — Other Ambulatory Visit: Payer: Self-pay | Admitting: Neurosurgery

## 2024-05-17 ENCOUNTER — Other Ambulatory Visit: Payer: Self-pay | Admitting: Family Medicine

## 2024-05-17 ENCOUNTER — Telehealth: Payer: Self-pay

## 2024-05-17 MED ORDER — CELECOXIB 200 MG PO CAPS: 200.0000 mg | ORAL_CAPSULE | Freq: Two times a day (BID) | ORAL | 0 refills | Status: DC

## 2024-05-17 NOTE — Progress Notes (Signed)
 Pts wife called stating patient was having uncontrolled neck pain. He is 2 days s/p ACDF. I recommended the following  1,000mg  Robaxin  morning and at night (continue 500mg  for day time doses). He can take 5-10 of oxycodone  q4 hrs as needed for the next couple of days and I will send in celebrex 

## 2024-05-17 NOTE — Telephone Encounter (Signed)
 Pt's wife called reporting that Bryan Kirby is having a lot pain in the back of his neck. The medication regimen helps for a couple of hours, then he is miserable again.  He is taking: Oxycodone  5mg  every 4 hours Methocarbamol  500mg  every 6 hours Extra strength tylenol  1000mg  in between doses of oxycodone , but it doesn't make a difference He is also using ice  Arloa Prior on [employer] in [place]   She reports he is a little hoarse, but isn't having any trouble swallowing. We discussed red flags that warrant emergent evaluation.

## 2024-05-17 NOTE — Telephone Encounter (Addendum)
 Sent secure chat to Edsel Goods, PA-C to clarify that her previous message regarding robaxin .   I spoke with Mrs Weyer and relayed that he can increase his robaxin  to 1000mg  in the morning and night time doses (continue 500mg  during the 2 day time doses), take oxycodone  5-10mg  every 4 hours as needed for the next couple of days, and that he can start celebrex  (1 capsule twice per day). She verbalized understanding.

## 2024-05-24 NOTE — Progress Notes (Signed)
   REFERRING PHYSICIAN:  Watt Mirza, Md [address] [place],  [place] [address]  DOS: 05/15/24  ACDF C5-C7  HISTORY OF PRESENT ILLNESS: Bryan Kirby is 2 weeks status post above surgery. Given oxycodone  and robaxin  on discharge from the hospital.   He called after surgery with increased pain- he was to increase robaxin  to 1000mg  for am and pm dose, was sent in celebrex , and was advised to take oxycodone  1-2 q 4 hours prn.   He is feeling some better. He has noted some intermittent swallowing issues with larger pills. He has some left sided neck and shoulder pain- this is worse than it was preop.   He needs a refill of oxycodone - he is only taking one a day. Did not take any yesterday.  He is taking robaxin  and tylenol . Is not taking celebrex .   PHYSICAL EXAMINATION:  NEUROLOGICAL:  General: In no acute distress.   Awake, alert, oriented to person, place, and time.  Pupils equal round and reactive to light.  Facial tone is symmetric.    Strength: Side Biceps Triceps Deltoid Interossei Grip Wrist Ext. Wrist Flex.  R 5 5 5 5 5 5 5   L 5 5 5 5 5 5 5    Incision c/d/i  Imaging:  Nothing new to review.   Assessment / Plan: Bryan Kirby is doing well s/p above surgery. Treatment options reviewed with patient and following plan made:   - We discussed activity escalation and I have advised the patient to lift up to 10 pounds until 6 weeks after surgery (until follow up with Dr. Clois).   - Reviewed wound care.  - Continue current medications including prn oxycodone  and robaxin .  - Refill given on oxycodone . PMP reviewed and is appropriate.  - Follow up as scheduled in 4 weeks and prn. Will need xrays.   Advised to contact the office if any questions or concerns arise.   Glade Boys PA-C Dept of Neurosurgery

## 2024-05-29 ENCOUNTER — Encounter: Payer: Self-pay | Admitting: Orthopedic Surgery

## 2024-05-29 ENCOUNTER — Ambulatory Visit (INDEPENDENT_AMBULATORY_CARE_PROVIDER_SITE_OTHER): Admitting: Orthopedic Surgery

## 2024-05-29 VITALS — BP 124/78 | Temp 97.9°F | Ht 70.5 in | Wt 272.0 lb

## 2024-05-29 DIAGNOSIS — M5412 Radiculopathy, cervical region: Secondary | ICD-10-CM

## 2024-05-29 DIAGNOSIS — Z981 Arthrodesis status: Secondary | ICD-10-CM

## 2024-05-29 MED ORDER — OXYCODONE HCL 5 MG PO TABS: 5.0000 mg | ORAL_TABLET | Freq: Three times a day (TID) | ORAL | 0 refills | Status: DC | PRN

## 2024-05-29 NOTE — Anesthesia Postprocedure Evaluation (Signed)
 Anesthesia Post Note  Patient: Glendia LITTIE Qua  Procedure(s) Performed: C5-7 ANTERIOR CERVICAL DISCECTOMY AND FUSION (Spine Cervical)  Patient location during evaluation: PACU Anesthesia Type: General Level of consciousness: awake and alert Pain management: pain level controlled Vital Signs Assessment: post-procedure vital signs reviewed and stable Respiratory status: spontaneous breathing, nonlabored ventilation, respiratory function stable and patient connected to nasal cannula oxygen Cardiovascular status: blood pressure returned to baseline and stable Postop Assessment: no apparent nausea or vomiting Anesthetic complications: no   No notable events documented.   Last Vitals:  Vitals:   05/15/24 1323 05/15/24 1450  BP: 135/73 121/68  Pulse: (!) 58 71  Resp: 18 (!) 70  Temp: (!) 36.1 C (!) 36 C  SpO2: 96% 97%    Last Pain:  Vitals:   05/15/24 1450  TempSrc: Temporal  PainSc: 2                  Lynwood KANDICE Clause

## 2024-05-30 ENCOUNTER — Encounter: Payer: Self-pay | Admitting: Neurosurgery

## 2024-06-05 ENCOUNTER — Other Ambulatory Visit: Payer: Self-pay | Admitting: Family Medicine

## 2024-06-07 ENCOUNTER — Other Ambulatory Visit: Payer: Self-pay | Admitting: Family Medicine

## 2024-06-19 ENCOUNTER — Other Ambulatory Visit: Admitting: Urology

## 2024-06-26 ENCOUNTER — Other Ambulatory Visit: Payer: Self-pay

## 2024-06-26 ENCOUNTER — Ambulatory Visit
Admission: RE | Admit: 2024-06-26 | Discharge: 2024-06-26 | Disposition: A | Discharge status: Home / Self Care | Attending: Neurosurgery | Admitting: Neurosurgery

## 2024-06-26 ENCOUNTER — Ambulatory Visit
Admission: RE | Admit: 2024-06-26 | Discharge: 2024-06-26 | Disposition: A | Discharge status: Home / Self Care | Source: Ambulatory Visit | Attending: Neurosurgery | Admitting: Neurosurgery

## 2024-06-26 DIAGNOSIS — M5412 Radiculopathy, cervical region: Secondary | ICD-10-CM

## 2024-06-27 ENCOUNTER — Encounter: Payer: Self-pay | Admitting: Neurosurgery

## 2024-06-27 ENCOUNTER — Ambulatory Visit (INDEPENDENT_AMBULATORY_CARE_PROVIDER_SITE_OTHER): Admitting: Neurosurgery

## 2024-06-27 VITALS — BP 108/72 | Temp 98.8°F | Ht 71.0 in | Wt 275.2 lb

## 2024-06-27 DIAGNOSIS — Z981 Arthrodesis status: Secondary | ICD-10-CM

## 2024-06-27 DIAGNOSIS — M5412 Radiculopathy, cervical region: Secondary | ICD-10-CM

## 2024-06-27 NOTE — Progress Notes (Signed)
   REFERRING PHYSICIAN:  Watt Mirza, Md [address] [place],  [place] [address]  DOS: 05/15/24  ACDF C5-C7  HISTORY OF PRESENT ILLNESS: Bryan Kirby is status post above surgery.   He is still having some issues with swallowing larger pills.  His arm symptoms are better.  He has had some foot numbness since surgery.  PHYSICAL EXAMINATION:  NEUROLOGICAL:  General: In no acute distress.   Awake, alert, oriented to person, place, and time.  Pupils equal round and reactive to light.  Facial tone is symmetric.    Strength: Side Biceps Triceps Deltoid Interossei Grip Wrist Ext. Wrist Flex.  R 5 5 5 5 5 5 5   L 5 5 5 5 5 5 5    Incision c/d/i  Imaging:  No complications noted  Assessment / Plan: Bryan Kirby is doing well s/p above surgery.   His numbness is isolated to his foot and is not currently causing him any mobility or pain issues.  Will watch that for now.  It will probably improve.  He is doing well from his neck surgery.  If he is still having issues swallowing in 6 weeks, we will consider referral for speech pathology.  I have given him exercises for his neck.     Reeves Daisy MD Dept of Neurosurgery

## 2024-07-17 ENCOUNTER — Ambulatory Visit: Admitting: Urology

## 2024-07-17 VITALS — BP 118/76 | HR 78

## 2024-07-17 DIAGNOSIS — R3914 Feeling of incomplete bladder emptying: Secondary | ICD-10-CM | POA: Diagnosis not present

## 2024-07-17 DIAGNOSIS — R3912 Poor urinary stream: Secondary | ICD-10-CM | POA: Diagnosis not present

## 2024-07-17 DIAGNOSIS — R399 Unspecified symptoms and signs involving the genitourinary system: Secondary | ICD-10-CM

## 2024-07-17 DIAGNOSIS — R351 Nocturia: Secondary | ICD-10-CM | POA: Diagnosis not present

## 2024-07-17 MED ORDER — TAMSULOSIN HCL 0.4 MG PO CAPS: 0.4000 mg | ORAL_CAPSULE | Freq: Two times a day (BID) | ORAL | 6 refills | Status: AC

## 2024-07-17 NOTE — Patient Instructions (Signed)

## 2024-07-17 NOTE — Progress Notes (Signed)
 Cystoscopy Procedure Note:  Indication: Urinary symptoms  Primary complaint is weak stream, sensation of incomplete emptying(PVRs have been normal), and nocturia 1-2 times at night.  He is fairly convinced his urinary symptoms started after doing some heavy lifting building a shed.  Has had minimal improvement with Flomax  and anticholinergics from PCP.  After informed consent and discussion of the procedure and its risks, Tyrees L Koper was positioned and prepped in the standard fashion. Cystoscopy was performed with a flexible cystoscope. The urethra, bladder neck and entire bladder was visualized in a standard fashion. The prostate was moderate in size with lateral lobe hypertrophy, mild bladder neck elevation. The ureteral orifices were visualized in their normal location and orientation.  Bladder mucosa grossly normal throughout, no abnormalities on retroflexion, no median lobe  Imaging: CT stone protocol July 2025 no urologic abnormalities, prostate measures 45g  Findings: Normal cystoscopy  -------------------------------------------------  Assessment and Plan: 68 year old male with urinary symptoms of weak stream, sensation of incomplete emptying, nocturia 1-2 times at night.  Cystoscopy today normal, CT normal.  We discussed possible etiologies including BPH, OAB, pelvic floor dysfunction.  Would lean more towards pelvic floor dysfunction based on acute onset of symptoms after doing some heavy lifting.  Pelvic floor stretching exercises provided, we discussed he could also increase the Flomax  to twice daily and risks and benefits were discussed.  Could also consider UroLift in the future if no improvement, but may need to have some realistic expectations regarding nocturia with his comorbidities and obesity.  Nocturia strategies and avoiding bladder irritants were also discussed.  -Pelvic floor stretching exercises provided -Avoid bladder irritants -Flomax  increased to twice daily -RTC  3 months symptom check, consider UroLift in the future if no improvement with the above strategies  Redell Burnet, MD 07/17/2024

## 2024-07-18 ENCOUNTER — Other Ambulatory Visit: Payer: Self-pay | Admitting: Family Medicine

## 2024-07-18 ENCOUNTER — Other Ambulatory Visit: Payer: Self-pay | Admitting: Neurosurgery

## 2024-08-01 ENCOUNTER — Other Ambulatory Visit: Payer: Self-pay

## 2024-08-01 DIAGNOSIS — L812 Freckles: Secondary | ICD-10-CM | POA: Diagnosis not present

## 2024-08-01 DIAGNOSIS — L820 Inflamed seborrheic keratosis: Secondary | ICD-10-CM | POA: Diagnosis not present

## 2024-08-01 DIAGNOSIS — D1801 Hemangioma of skin and subcutaneous tissue: Secondary | ICD-10-CM | POA: Diagnosis not present

## 2024-08-01 DIAGNOSIS — L821 Other seborrheic keratosis: Secondary | ICD-10-CM | POA: Diagnosis not present

## 2024-08-01 DIAGNOSIS — L570 Actinic keratosis: Secondary | ICD-10-CM | POA: Diagnosis not present

## 2024-08-01 DIAGNOSIS — M5412 Radiculopathy, cervical region: Secondary | ICD-10-CM

## 2024-08-04 NOTE — Progress Notes (Unsigned)
   REFERRING PHYSICIAN:  Watt Mirza, Md [address] [place],  [place] [address]  DOS: 05/15/24  ACDF C5-C7  HISTORY OF PRESENT ILLNESS:  Still having some swallowing issues last visit (with larger pills). He had some left foot numbness.   His swallowing has improved since last visit, still gets stuck on bigger pills. He has no significant neck or arm pain. He feels so much better since having his surgery- he is very happy.   He still has numbness in left foot, but it does not bother him.    PHYSICAL EXAMINATION:  NEUROLOGICAL:  General: In no acute distress.   Awake, alert, oriented to person, place, and time.  Pupils equal round and reactive to light.  Facial tone is symmetric.    Strength: Side Biceps Triceps Deltoid Interossei Grip Wrist Ext. Wrist Flex.  R 5 5 5 5 5 5 5   L 5 5 5 5 5 5 5    Incision well healed.   Imaging:  Cervical xrays dated 08/07/24:  No complications noted.   Report for above xrays not yet available.   Assessment / Plan: Bryan Kirby is doing well s/p above surgery. Treatment options reviewed with patient and following plan made:   - He can return to activity as tolerated.  - Discussed swallowing issues- it is improving and he would like to give it more time. If things get worse, refer to ENT/speech pathology.  - Still has numbness in left foot- will watch for now. Not causing any pain or mobility issues.   - Follow up as scheduled in 6 months and prn. Will need xrays.   Advised to contact the office if any questions or concerns arise.   Glade Boys PA-C Dept of Neurosurgery

## 2024-08-05 ENCOUNTER — Encounter: Payer: Self-pay | Admitting: Radiology

## 2024-08-07 ENCOUNTER — Encounter: Payer: Self-pay | Admitting: Orthopedic Surgery

## 2024-08-07 ENCOUNTER — Ambulatory Visit: Admitting: Orthopedic Surgery

## 2024-08-07 ENCOUNTER — Ambulatory Visit

## 2024-08-07 VITALS — BP 122/78 | Temp 97.7°F | Ht 71.0 in | Wt 275.0 lb

## 2024-08-07 DIAGNOSIS — M5412 Radiculopathy, cervical region: Secondary | ICD-10-CM

## 2024-08-07 DIAGNOSIS — Z981 Arthrodesis status: Secondary | ICD-10-CM

## 2024-08-14 ENCOUNTER — Other Ambulatory Visit: Payer: Self-pay | Admitting: Neurosurgery

## 2024-08-28 ENCOUNTER — Other Ambulatory Visit: Payer: Self-pay

## 2024-08-28 MED ORDER — TAMSULOSIN HCL 0.4 MG PO CAPS: 0.4000 mg | ORAL_CAPSULE | Freq: Every day | ORAL | Status: DC

## 2024-08-28 MED ORDER — TAMSULOSIN HCL 0.4 MG PO CAPS: 0.4000 mg | ORAL_CAPSULE | Freq: Two times a day (BID) | ORAL | 0 refills | Status: AC

## 2024-08-28 NOTE — Progress Notes (Signed)
 Patient came in to clinic this morning. Was taking Flomax  too often. Advised patient that medication is to be taken 1 tablet in the morning and one at night. Will send in patient additional refill since he is almost out of medication.

## 2024-09-03 ENCOUNTER — Ambulatory Visit: Admitting: Physician Assistant

## 2024-09-19 ENCOUNTER — Ambulatory Visit: Admitting: Orthopedic Surgery

## 2024-10-07 ENCOUNTER — Telehealth: Payer: Self-pay | Admitting: Neurosurgery

## 2024-10-07 DIAGNOSIS — M5412 Radiculopathy, cervical region: Secondary | ICD-10-CM

## 2024-10-07 DIAGNOSIS — Z981 Arthrodesis status: Secondary | ICD-10-CM

## 2024-10-07 NOTE — Telephone Encounter (Signed)
 DOS: 05/15/24 ACDF C5-C7   Recommend going down to celebrex  once a day since he is so far out from his surgery.   Refill sent with change in directions. He has motrin  on his list, make sure he is not taking this with celebrex . Can do one or the other, not both.

## 2024-10-07 NOTE — Telephone Encounter (Signed)
"  Patient advised  "

## 2024-10-07 NOTE — Telephone Encounter (Signed)
 Referral for  ENT. They should call him in next few days. If he does not hear from them, let us  know.

## 2024-10-07 NOTE — Telephone Encounter (Signed)
 Discussed swallowing issues at his last visit with me. Swallowing was improving and he wanted to give it more time.   If it is bothering him, then I recommend he see [facility] to look into things further. Let me know if he wants me to put in the referral.

## 2024-10-07 NOTE — Addendum Note (Signed)
 Addended byBETHA HILMA HASTINGS on: 10/07/2024 03:05 PM   Modules accepted: Orders

## 2024-10-16 ENCOUNTER — Ambulatory Visit: Admitting: Urology

## 2024-10-16 DIAGNOSIS — R399 Unspecified symptoms and signs involving the genitourinary system: Secondary | ICD-10-CM

## 2024-11-07 ENCOUNTER — Telehealth: Payer: Self-pay

## 2024-11-07 ENCOUNTER — Other Ambulatory Visit: Payer: Self-pay

## 2024-11-07 DIAGNOSIS — Z981 Arthrodesis status: Secondary | ICD-10-CM

## 2024-11-07 DIAGNOSIS — M5412 Radiculopathy, cervical region: Secondary | ICD-10-CM

## 2024-11-07 MED ORDER — CELECOXIB 200 MG PO CAPS: 200.0000 mg | ORAL_CAPSULE | Freq: Every day | ORAL | 0 refills | Status: AC

## 2024-11-07 NOTE — Telephone Encounter (Signed)
 Patient notified medication at pharmacy. He is not taking Motrin  at this time. I asked him not to take both, take one or the other. Patient verbalized understanding.

## 2024-11-07 NOTE — Telephone Encounter (Signed)
 Celecoxib  200mg  capsule  1 capsule by mouth daily F/U with Dr.Yarbrough on 02/04/25 C5-7 ACDF on 05/15/24/  [facility]  [address] [place] [address]

## 2024-11-07 NOTE — Telephone Encounter (Signed)
 Refill of celebrex  sent to pharmacy. Please let him know.   Also he has motrin  on his list, make sure he is not taking this with celebrex . Can do one or the other, not both.   If he is not taking motrin , please remove it.

## 2025-02-04 ENCOUNTER — Ambulatory Visit: Admitting: Neurosurgery

## 2025-04-16 ENCOUNTER — Encounter: Admitting: Family Medicine
